# Patient Record
Sex: Female | Born: 1985 | Race: White | Hispanic: No | Smoking: Never smoker
Health system: Southern US, Community
[De-identification: ages and names within clinical notes are randomized; demographics above are authoritative.]

## PROBLEM LIST (undated history)

## (undated) DIAGNOSIS — F32A Depression, unspecified: Secondary | ICD-10-CM

## (undated) DIAGNOSIS — O99012 Anemia complicating pregnancy, second trimester: Secondary | ICD-10-CM

## (undated) DIAGNOSIS — F419 Anxiety disorder, unspecified: Secondary | ICD-10-CM

## (undated) DIAGNOSIS — F329 Major depressive disorder, single episode, unspecified: Secondary | ICD-10-CM

## (undated) HISTORY — DX: Anemia complicating pregnancy, second trimester: O99.012

## (undated) HISTORY — PX: KNEE SURGERY: SHX244

## (undated) HISTORY — PX: TONSILLECTOMY: SUR1361

## (undated) HISTORY — PX: LAPAROSCOPIC ABDOMINAL EXPLORATION: SHX6249

## (undated) HISTORY — PX: CHOLECYSTECTOMY: SHX55

---
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
## 2005-01-19 ENCOUNTER — Observation Stay: Payer: Self-pay | Admitting: Obstetrics & Gynecology

## 2005-04-02 ENCOUNTER — Observation Stay: Payer: Self-pay

## 2005-04-26 ENCOUNTER — Observation Stay: Payer: Self-pay

## 2005-05-12 ENCOUNTER — Inpatient Hospital Stay: Payer: Self-pay | Admitting: Obstetrics & Gynecology

## 2005-05-18 ENCOUNTER — Ambulatory Visit: Payer: Self-pay | Admitting: Pediatrics

## 2005-08-23 ENCOUNTER — Emergency Department: Payer: Self-pay | Admitting: General Practice

## 2005-11-19 ENCOUNTER — Emergency Department: Payer: Self-pay | Admitting: Internal Medicine

## 2005-11-20 ENCOUNTER — Ambulatory Visit: Payer: Self-pay | Admitting: Internal Medicine

## 2006-08-09 ENCOUNTER — Emergency Department: Payer: Self-pay | Admitting: Emergency Medicine

## 2006-08-13 ENCOUNTER — Emergency Department: Payer: Self-pay | Admitting: Emergency Medicine

## 2006-08-16 ENCOUNTER — Ambulatory Visit: Payer: Self-pay | Admitting: Emergency Medicine

## 2006-08-24 ENCOUNTER — Ambulatory Visit: Payer: Self-pay

## 2006-09-23 ENCOUNTER — Emergency Department: Payer: Self-pay | Admitting: Emergency Medicine

## 2006-11-30 ENCOUNTER — Ambulatory Visit: Payer: Self-pay

## 2007-01-29 ENCOUNTER — Observation Stay: Payer: Self-pay

## 2007-04-11 ENCOUNTER — Ambulatory Visit: Payer: Self-pay | Admitting: Obstetrics & Gynecology

## 2007-04-12 ENCOUNTER — Inpatient Hospital Stay: Payer: Self-pay | Admitting: Obstetrics & Gynecology

## 2008-04-17 ENCOUNTER — Emergency Department: Payer: Self-pay | Admitting: Emergency Medicine

## 2008-11-17 ENCOUNTER — Emergency Department: Payer: Self-pay | Admitting: Unknown Physician Specialty

## 2009-03-25 ENCOUNTER — Emergency Department: Payer: Self-pay | Admitting: Emergency Medicine

## 2009-04-30 ENCOUNTER — Observation Stay: Payer: Self-pay

## 2009-06-13 ENCOUNTER — Observation Stay: Payer: Self-pay

## 2009-07-17 ENCOUNTER — Ambulatory Visit: Payer: Self-pay | Admitting: Obstetrics & Gynecology

## 2009-07-18 ENCOUNTER — Inpatient Hospital Stay: Payer: Self-pay

## 2009-10-31 ENCOUNTER — Ambulatory Visit: Payer: Self-pay | Admitting: Internal Medicine

## 2010-09-05 ENCOUNTER — Ambulatory Visit: Payer: Self-pay

## 2010-09-26 ENCOUNTER — Ambulatory Visit: Payer: Self-pay | Admitting: Internal Medicine

## 2010-09-27 ENCOUNTER — Emergency Department: Payer: Self-pay | Admitting: Unknown Physician Specialty

## 2010-10-07 ENCOUNTER — Ambulatory Visit: Payer: Self-pay | Admitting: Anesthesiology

## 2010-10-09 ENCOUNTER — Ambulatory Visit: Payer: Self-pay | Admitting: Orthopedic Surgery

## 2011-02-22 ENCOUNTER — Emergency Department: Payer: Self-pay | Admitting: Emergency Medicine

## 2011-08-26 ENCOUNTER — Observation Stay: Payer: Self-pay

## 2011-08-26 LAB — URINALYSIS, COMPLETE
Bilirubin,UR: NEGATIVE
Blood: NEGATIVE
Glucose,UR: NEGATIVE mg/dL (ref 0–75)
Ph: 5 (ref 4.5–8.0)
Protein: 30
Specific Gravity: 1.026 (ref 1.003–1.030)
Squamous Epithelial: 7
WBC UR: 7 /HPF (ref 0–5)

## 2011-10-23 ENCOUNTER — Inpatient Hospital Stay: Payer: Self-pay

## 2011-10-23 LAB — CBC WITH DIFFERENTIAL/PLATELET
Basophil #: 0 10*3/uL (ref 0.0–0.1)
Basophil %: 0.2 %
Eosinophil #: 0 10*3/uL (ref 0.0–0.7)
Eosinophil %: 0.4 %
HCT: 25.9 % — ABNORMAL LOW (ref 35.0–47.0)
HGB: 8.3 g/dL — ABNORMAL LOW (ref 12.0–16.0)
Lymphocyte #: 1.2 10*3/uL (ref 1.0–3.6)
MCH: 25 pg — ABNORMAL LOW (ref 26.0–34.0)
MCHC: 31.9 g/dL — ABNORMAL LOW (ref 32.0–36.0)
MCV: 78 fL — ABNORMAL LOW (ref 80–100)
Monocyte #: 0.4 x10 3/mm (ref 0.2–0.9)
Monocyte %: 4.1 %
Neutrophil #: 8.3 10*3/uL — ABNORMAL HIGH (ref 1.4–6.5)
Neutrophil %: 82.9 %
Platelet: 227 10*3/uL (ref 150–440)
RBC: 3.31 10*6/uL — ABNORMAL LOW (ref 3.80–5.20)
RDW: 14.7 % — ABNORMAL HIGH (ref 11.5–14.5)

## 2011-10-25 LAB — BASIC METABOLIC PANEL
Anion Gap: 9 (ref 7–16)
Calcium, Total: 8.2 mg/dL — ABNORMAL LOW (ref 8.5–10.1)
Co2: 26 mmol/L (ref 21–32)
EGFR (African American): >60
EGFR (Non-African Amer.): >60
Glucose: 80 mg/dL (ref 65–99)
Sodium: 139 mmol/L (ref 136–145)

## 2011-10-25 LAB — CBC WITH DIFFERENTIAL/PLATELET
Basophil %: 0.1 %
HCT: 22 % — ABNORMAL LOW (ref 35.0–47.0)
HGB: 7.4 g/dL — ABNORMAL LOW (ref 12.0–16.0)
Lymphocyte #: 1.6 10*3/uL (ref 1.0–3.6)
Lymphocyte %: 17.1 %
MCH: 26.3 pg (ref 26.0–34.0)
MCHC: 33.3 g/dL (ref 32.0–36.0)
MCV: 79 fL — ABNORMAL LOW (ref 80–100)
Monocyte %: 5.8 %
Neutrophil #: 7.1 10*3/uL — ABNORMAL HIGH (ref 1.4–6.5)
Platelet: 218 10*3/uL (ref 150–440)
WBC: 9.4 10*3/uL (ref 3.6–11.0)

## 2012-03-23 ENCOUNTER — Ambulatory Visit: Payer: Self-pay | Admitting: Orthopedic Surgery

## 2012-05-11 ENCOUNTER — Ambulatory Visit: Payer: Self-pay

## 2012-06-06 ENCOUNTER — Ambulatory Visit: Payer: Self-pay

## 2012-09-28 ENCOUNTER — Emergency Department: Payer: Self-pay | Admitting: Emergency Medicine

## 2012-09-28 LAB — CBC
HCT: 31.3 % — ABNORMAL LOW (ref 35.0–47.0)
MCH: 26.3 pg (ref 26.0–34.0)
MCHC: 34.2 g/dL (ref 32.0–36.0)
MCV: 77 fL — ABNORMAL LOW (ref 80–100)
Platelet: 316 10*3/uL (ref 150–440)
RBC: 4.07 10*6/uL (ref 3.80–5.20)

## 2012-09-28 LAB — BASIC METABOLIC PANEL
Anion Gap: 2 — ABNORMAL LOW (ref 7–16)
Chloride: 103 mmol/L (ref 98–107)
Co2: 32 mmol/L (ref 21–32)
Creatinine: 0.86 mg/dL (ref 0.60–1.30)
EGFR (Non-African Amer.): >60
Glucose: 92 mg/dL (ref 65–99)
Osmolality: 273 (ref 275–301)
Sodium: 137 mmol/L (ref 136–145)

## 2012-09-28 LAB — URINALYSIS, COMPLETE
Ketone: NEGATIVE
Leukocyte Esterase: NEGATIVE
Nitrite: NEGATIVE
Protein: NEGATIVE
Squamous Epithelial: 4
WBC UR: 2 /HPF (ref 0–5)

## 2012-12-12 ENCOUNTER — Emergency Department: Payer: Self-pay | Admitting: Emergency Medicine

## 2012-12-31 ENCOUNTER — Emergency Department: Payer: Self-pay | Admitting: Emergency Medicine

## 2012-12-31 LAB — COMPREHENSIVE METABOLIC PANEL
Alkaline Phosphatase: 143 U/L — ABNORMAL HIGH (ref 50–136)
Chloride: 107 mmol/L (ref 98–107)
EGFR (African American): >60
EGFR (Non-African Amer.): >60
Glucose: 81 mg/dL (ref 65–99)
Osmolality: 272 (ref 275–301)
Potassium: 3.8 mmol/L (ref 3.5–5.1)
SGOT(AST): 43 U/L — ABNORMAL HIGH (ref 15–37)
Sodium: 138 mmol/L (ref 136–145)

## 2012-12-31 LAB — CBC
HGB: 10.6 g/dL — ABNORMAL LOW (ref 12.0–16.0)
MCH: 26.3 pg (ref 26.0–34.0)
MCHC: 33.4 g/dL (ref 32.0–36.0)
RDW: 13.8 % (ref 11.5–14.5)

## 2012-12-31 LAB — URINALYSIS, COMPLETE
Bacteria: NONE SEEN
Glucose,UR: NEGATIVE mg/dL (ref 0–75)
Leukocyte Esterase: NEGATIVE
Nitrite: NEGATIVE
Specific Gravity: 1.005 (ref 1.003–1.030)

## 2012-12-31 LAB — GC/CHLAMYDIA PROBE AMP

## 2013-09-10 ENCOUNTER — Emergency Department: Payer: Self-pay | Admitting: Emergency Medicine

## 2013-09-10 LAB — BASIC METABOLIC PANEL
Anion Gap: 4 — ABNORMAL LOW (ref 7–16)
BUN: 12 mg/dL (ref 7–18)
CHLORIDE: 105 mmol/L (ref 98–107)
Calcium, Total: 8.7 mg/dL (ref 8.5–10.1)
Co2: 29 mmol/L (ref 21–32)
Creatinine: 0.79 mg/dL (ref 0.60–1.30)
EGFR (African American): >60
Glucose: 79 mg/dL (ref 65–99)
Osmolality: 274 (ref 275–301)
Potassium: 4.3 mmol/L (ref 3.5–5.1)
Sodium: 138 mmol/L (ref 136–145)

## 2013-09-10 LAB — URINALYSIS, COMPLETE
BILIRUBIN, UR: NEGATIVE
Bacteria: NONE SEEN
GLUCOSE, UR: NEGATIVE mg/dL (ref 0–75)
KETONE: NEGATIVE
LEUKOCYTE ESTERASE: NEGATIVE
Nitrite: NEGATIVE
Ph: 6 (ref 4.5–8.0)
Protein: NEGATIVE
RBC,UR: 1 /HPF (ref 0–5)
SPECIFIC GRAVITY: 1.011 (ref 1.003–1.030)
Squamous Epithelial: 4

## 2013-09-10 LAB — CBC
HCT: 32.1 % — AB (ref 35.0–47.0)
HGB: 10.6 g/dL — ABNORMAL LOW (ref 12.0–16.0)
MCH: 27.2 pg (ref 26.0–34.0)
MCHC: 33.1 g/dL (ref 32.0–36.0)
MCV: 82 fL (ref 80–100)
Platelet: 292 10*3/uL (ref 150–440)
RBC: 3.9 10*6/uL (ref 3.80–5.20)
RDW: 13.7 % (ref 11.5–14.5)
WBC: 9.6 10*3/uL (ref 3.6–11.0)

## 2013-09-10 LAB — TSH: Thyroid Stimulating Horm: 2.27 u[IU]/mL

## 2013-11-24 ENCOUNTER — Encounter (HOSPITAL_COMMUNITY): Payer: Self-pay | Admitting: Emergency Medicine

## 2013-11-24 ENCOUNTER — Emergency Department (HOSPITAL_COMMUNITY)
Admission: EM | Admit: 2013-11-24 | Discharge: 2013-11-24 | Disposition: A | Discharge status: Home / Self Care | Payer: Self-pay | Attending: Emergency Medicine | Admitting: Emergency Medicine

## 2013-11-24 DIAGNOSIS — M542 Cervicalgia: Secondary | ICD-10-CM | POA: Insufficient documentation

## 2013-11-24 DIAGNOSIS — Z79899 Other long term (current) drug therapy: Secondary | ICD-10-CM | POA: Insufficient documentation

## 2013-11-24 DIAGNOSIS — Z880 Allergy status to penicillin: Secondary | ICD-10-CM | POA: Insufficient documentation

## 2013-11-24 DIAGNOSIS — M436 Torticollis: Secondary | ICD-10-CM | POA: Insufficient documentation

## 2013-11-24 MED ORDER — KETOROLAC TROMETHAMINE 60 MG/2ML IM SOLN
60.0000 mg | Freq: Once | INTRAMUSCULAR | Status: AC
  Administered 2013-11-24: 60 mg via INTRAMUSCULAR
  Filled 2013-11-24: qty 2

## 2013-11-24 MED ORDER — DIAZEPAM 5 MG PO TABS
5.0000 mg | ORAL_TABLET | Freq: Once | ORAL | Status: AC
  Administered 2013-11-24: 5 mg via ORAL
  Filled 2013-11-24: qty 1

## 2013-11-24 MED ORDER — HYDROCODONE-ACETAMINOPHEN 5-325 MG PO TABS: 2.0000 | ORAL_TABLET | ORAL | Status: DC | PRN

## 2013-11-24 MED ORDER — DIAZEPAM 5 MG PO TABS: 5.0000 mg | ORAL_TABLET | Freq: Four times a day (QID) | ORAL | Status: DC | PRN

## 2013-11-24 NOTE — ED Provider Notes (Signed)
CSN: [id number]     Arrival date & time 11/24/13  1040 History   First MD Initiated Contact with Patient 11/24/13 1100    This chart was scribed for Gilda Crease, * by Marica Otter, ED Scribe. This patient was seen in room APFT24/APFT24 and the patient's care was started at 11:01 AM.  Chief Complaint  Patient presents with  . Neck Pain   The history is provided by the patient. No language interpreter was used.   PCP: No primary provider on file. HPI Comments: Michele [name] is a 28 y.o. female who presents to the Emergency Department complaining of worsening neck pain onset this morning. Pt also complains of limited ROM of the neck. Pt notes that she nearly lost consciousness and began to feel very warm when she was at work this morning after rotating her neck.   History reviewed. No pertinent past medical history. Past Surgical History  Procedure Laterality Date  . Cesarean section    . Tonsillectomy     No family history on file. History  Substance Use Topics  . Smoking status: Never Smoker   . Smokeless tobacco: Not on file  . Alcohol Use: No   OB History   Grav Para Term Preterm Abortions TAB SAB Ect Mult Living                 Review of Systems  Musculoskeletal: Positive for neck pain.  Psychiatric/Behavioral: Negative for confusion.  All other systems reviewed and are negative.     Allergies  Penicillins and Sulfa antibiotics  Home Medications   Prior to Admission medications   Medication Sig Start Date End Date Taking? Authorizing Provider  diazepam (VALIUM) 5 MG tablet Take 1 tablet (5 mg total) by mouth every 6 (six) hours as needed for anxiety (spasms). 11/24/13   Gilda Crease, MD  HYDROcodone-acetaminophen (NORCO/VICODIN) 5-325 MG per tablet Take 2 tablets by mouth every 4 (four) hours as needed for moderate pain. 11/24/13   Gilda Crease, MD   Triage Vitals: BP 133/83  Pulse 93  Temp(Src) 98.3 F (36.8 C) (Oral)  Resp  16  SpO2 100% Physical Exam  Constitutional: She is oriented to person, place, and time. She appears well-developed and well-nourished.  HENT:  Head: Normocephalic and atraumatic.  Mouth/Throat: Uvula is midline, oropharynx is clear and moist and mucous membranes are normal. No oropharyngeal exudate, posterior oropharyngeal edema, posterior oropharyngeal erythema or tonsillar abscesses.  Neck: Muscular tenderness present. No spinous process tenderness present. Decreased range of motion (muscle spasm on left) present. No Brudzinski's sign and no Kernig's sign noted.    Musculoskeletal:       Thoracic back: Normal.       Lumbar back: Normal.   Normal strength upper extremities bilaterally  Neurological: She is alert and oriented to person, place, and time. She has normal strength. No cranial nerve deficit or sensory deficit. She exhibits normal muscle tone. GCS eye subscore is 4. GCS verbal subscore is 5. GCS motor subscore is 6.  Reflex Scores:      Tricep reflexes are 1+ on the right side and 1+ on the left side.      Bicep reflexes are 1+ on the right side and 1+ on the left side.   ED Course  Procedures (including critical care time) DIAGNOSTIC STUDIES: Oxygen Saturation is 100% on RA, nl by my interpretation.    COORDINATION OF CARE: 11:04 AM-Discussed treatment plan which includes meds with pt at  bedside and pt agreed to plan.   Labs Review Labs Reviewed - No data to display  Imaging Review No results found.   EKG Interpretation None      MDM   Final diagnoses:  Torticollis, acute   Presents to the ER for evaluation of neck pain. Patient has acute onset of spasm of the left side neck muscles consistent with acute torticollis. Posterior neck is unremarkable. No abnormality on oropharyngeal examination. Vital signs normal, no fever. Patient has normal strength and sensation, reflexes of upper extremities. No imaging necessary. Treat with analgesia, Valium for acute spasm  of muscles.  I personally performed the services described in this documentation, which was scribed in my presence. The recorded information has been reviewed and is accurate.      Gilda Crease, MD 11/24/13 1110

## 2013-11-24 NOTE — ED Notes (Signed)
Pt reports waking up this am with left sided neck pain. Denies sore throat. Denies toothache. Pt worse with movement per pt.

## 2013-11-24 NOTE — Discharge Instructions (Signed)

## 2014-03-08 IMAGING — CT CT HEAD WITHOUT CONTRAST
1 series · 16 of 30 positions shown, 20 images · non-contrast
Comparison: none

REASON FOR EXAM: Headache (left sided), syncope
COMMENTS:

PROCEDURE:     CT  - CT HEAD WITHOUT CONTRAST  - September 28, 2012  [DATE]
RESULT:     Technique: Helical 5mm sections were obtained from the skull
base to the vertex without administration of intravenous contrast.

[Series 2: soft tissue · axial · 0.40mm/px · z∈[-144,+6]mm · 16 of 34 slices shown, 20 images]
[im 2/34  brain]
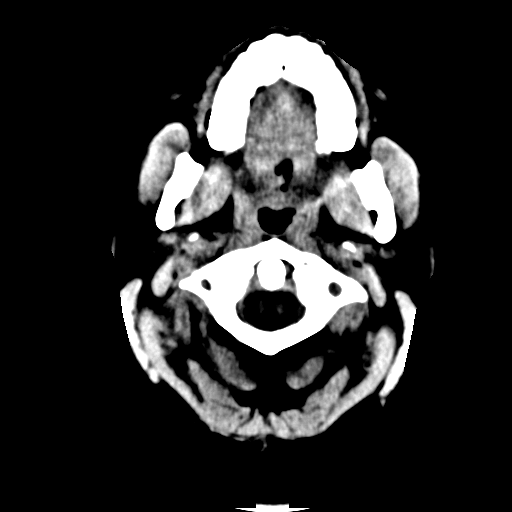
[im 2/34  bone]
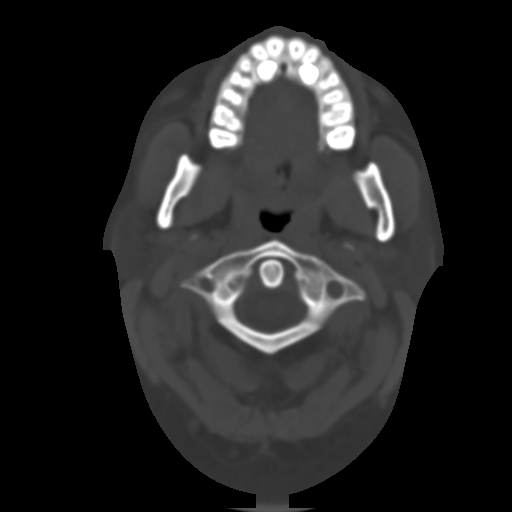
[im 4/34  brain]
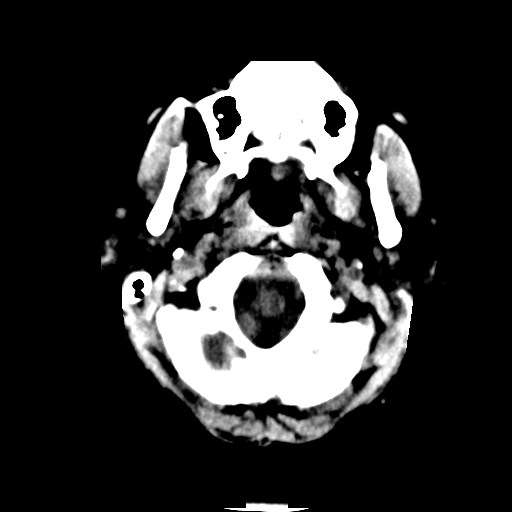
[im 6/34  brain]
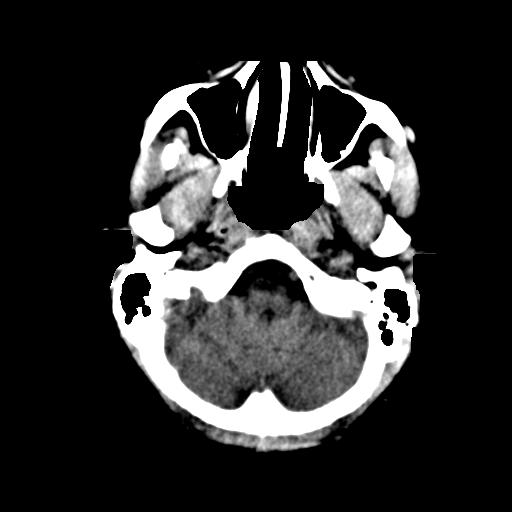
[im 8/34  brain]
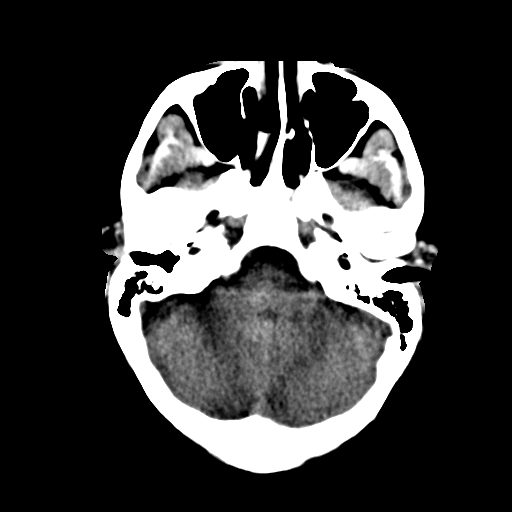
[im 10/34  brain]
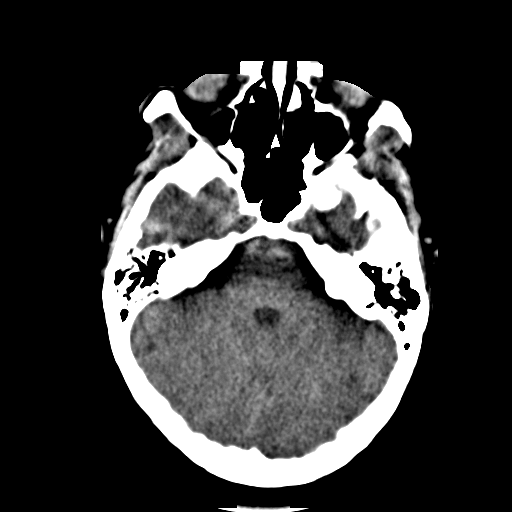
[im 10/34  bone]
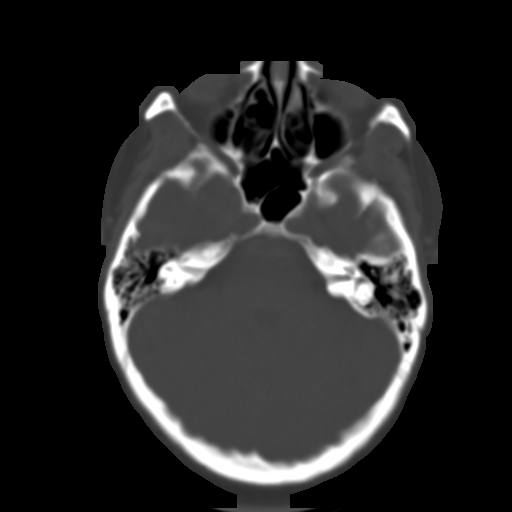
[im 12/34  brain]
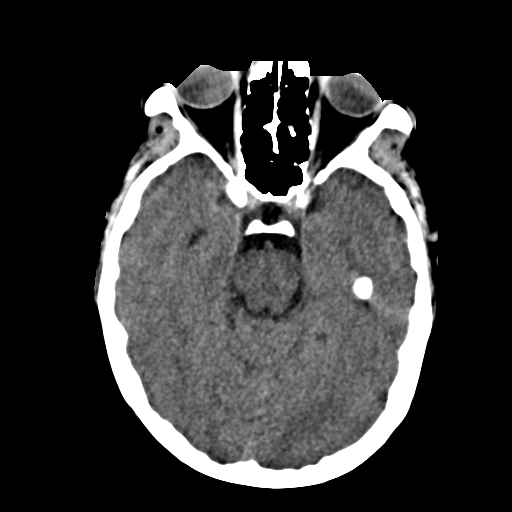
[im 14/34  brain]
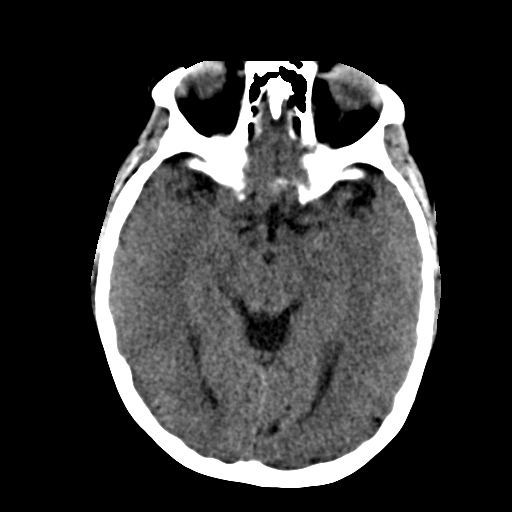
[im 16/34  brain]
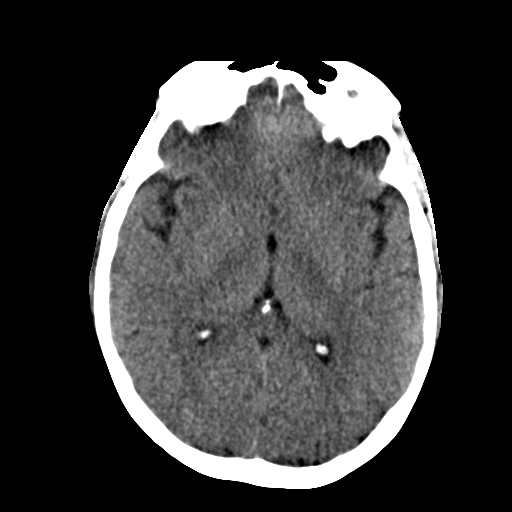
[im 18/34  brain]
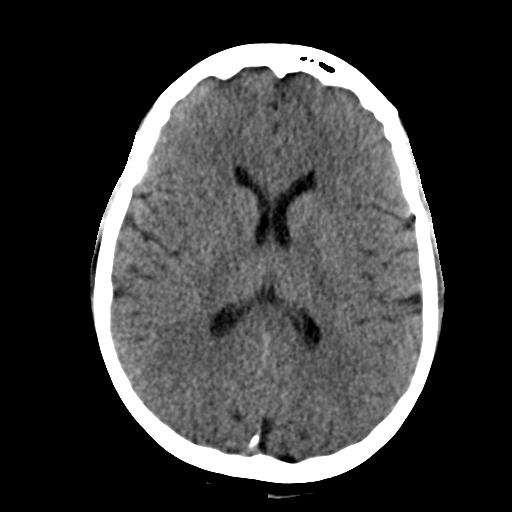
[im 18/34  bone]
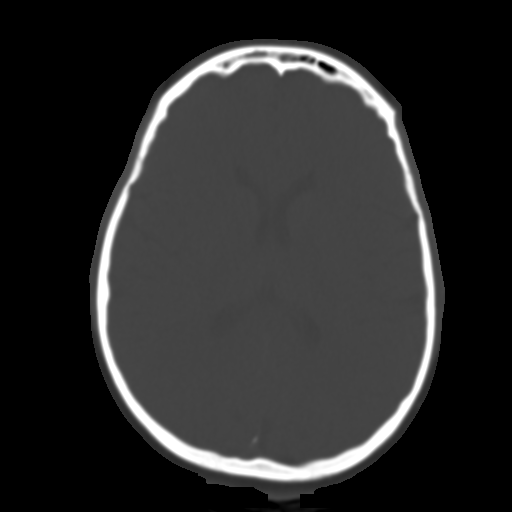
[im 20/34  brain]
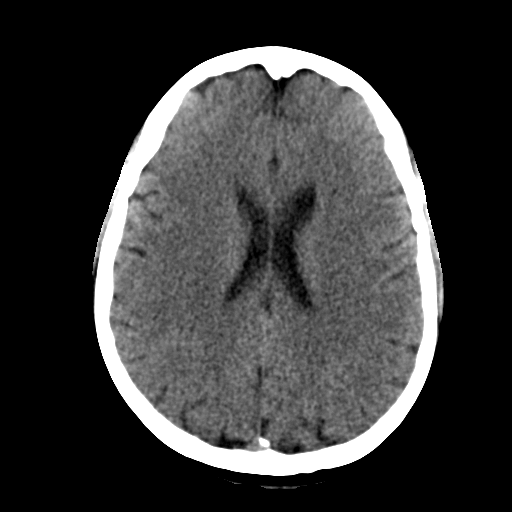
[im 22/34  brain]
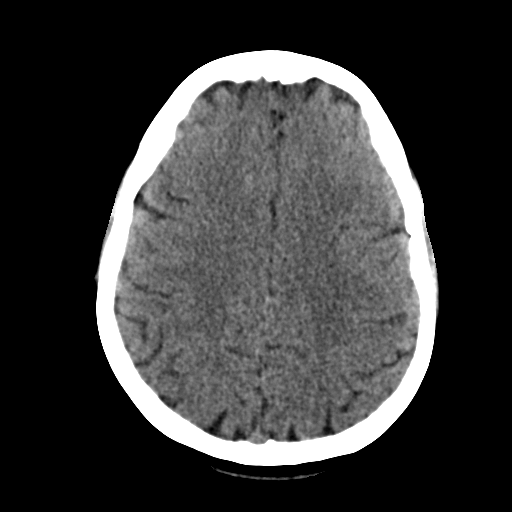
[im 24/34  brain]
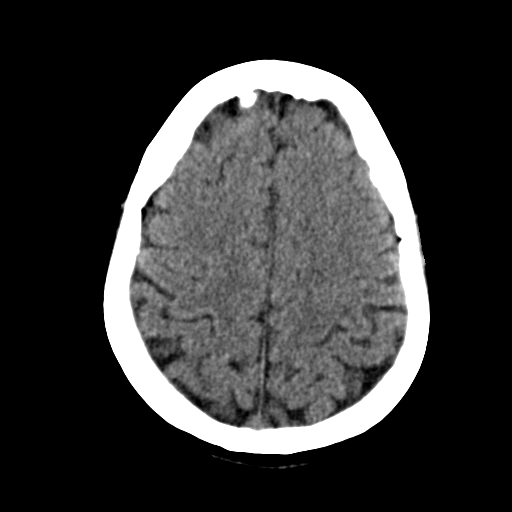
[im 26/34  brain]
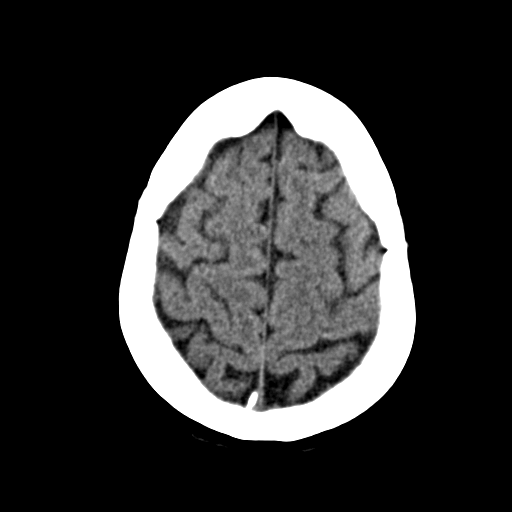
[im 26/34  bone]
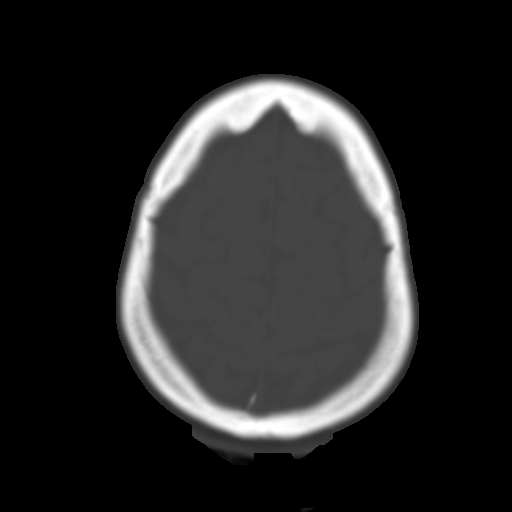
[im 28/34  brain]
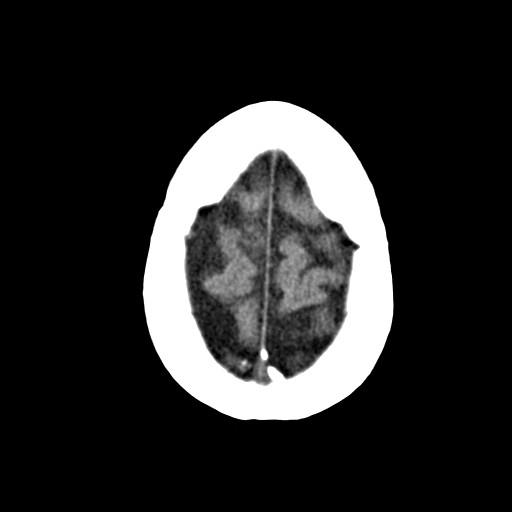
[im 30/34  brain]
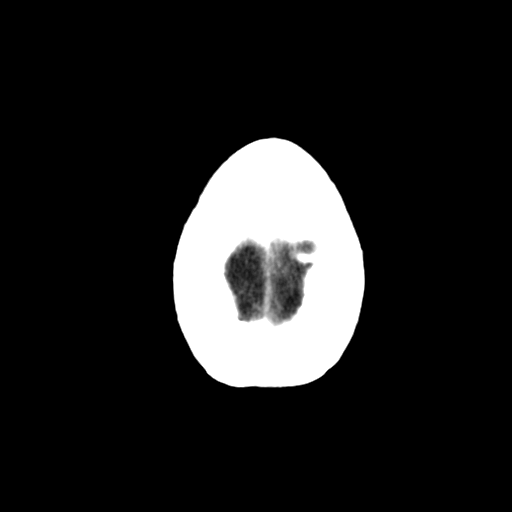
[im 32/34  brain]
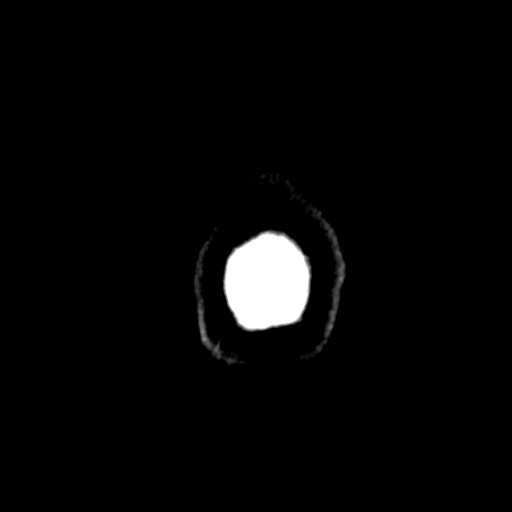

[16 of 30 positions shown; findings below may reference images not displayed]

FINDINGS: There is not evidence of intra-axial fluid collections. There is
no evidence of acute hemorrhage or secondary signs reflecting mass effect or
subacute or chronic focal territorial infarction. The osseous structures
demonstrate no evidence of a depressed skull fracture. If there is
persistent concern clinical follow-up with MRI is recommended.

Visualized paranasal sinuses and mastoid air cells are patent.
IMPRESSION: 1. No evidence of acute intracranial abnormalities.
2. Comparison made to prior study dated 09/27/2010.

## 2014-06-10 IMAGING — US US PELV - US TRANSVAGINAL
1 series · 14 of 25 positions shown · non-contrast
Comparison: none

REASON FOR EXAM: RLQ abd pain
COMMENTS:

[Series 1: us pelv - us transvaginal · 0.28mm/px · 14 of 90 slices shown]
[im 1/90]
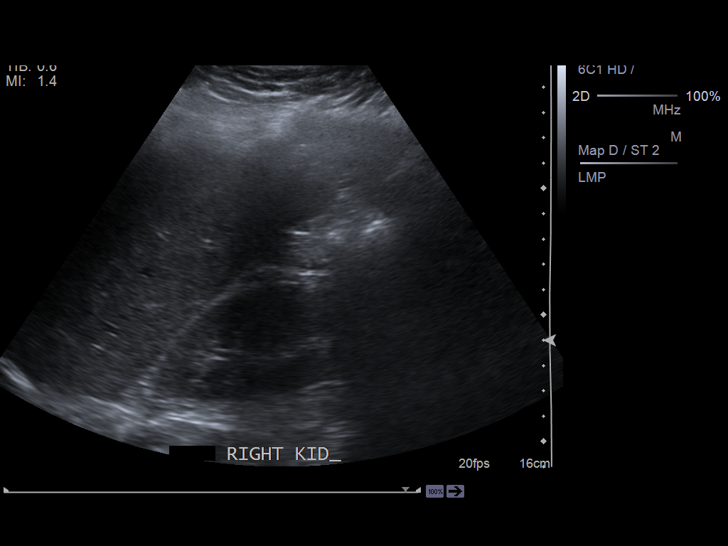
[im 8/90]
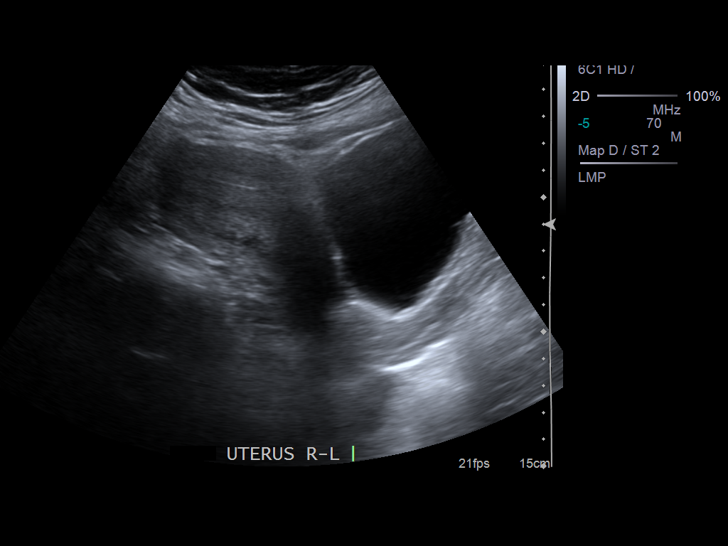
[im 15/90]
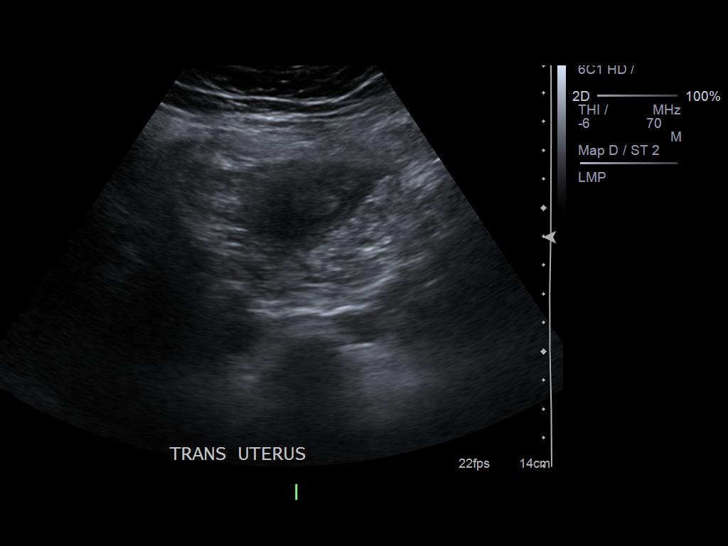
[im 23/90]
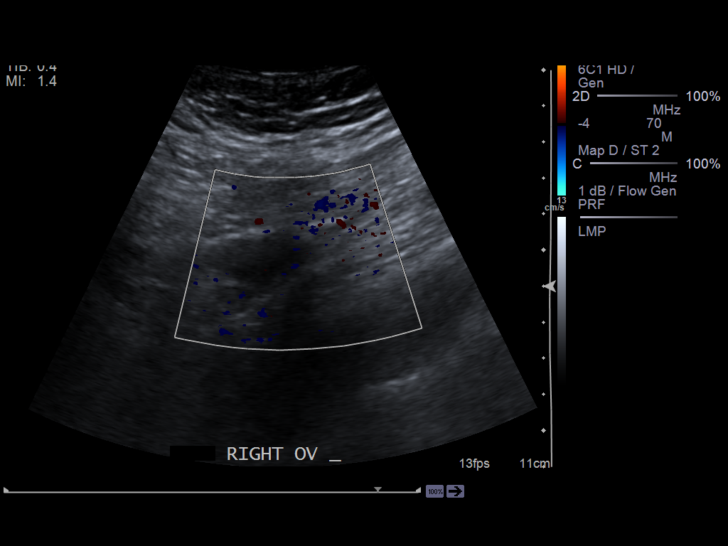
[im 30/90]
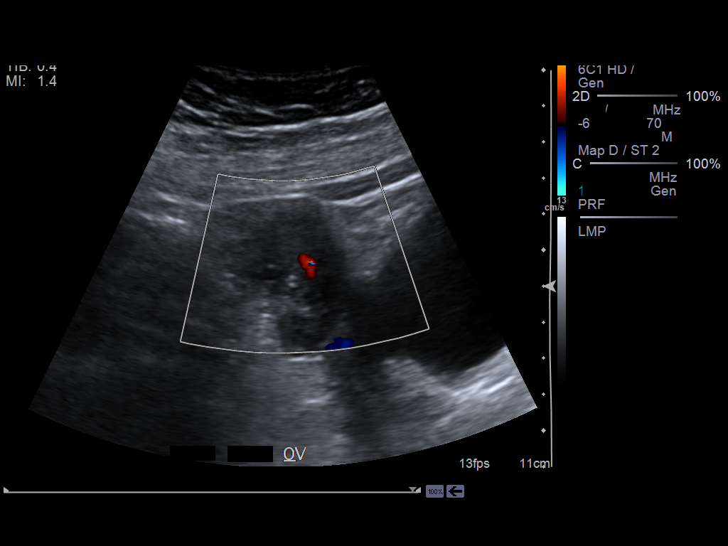
[im 34/90]
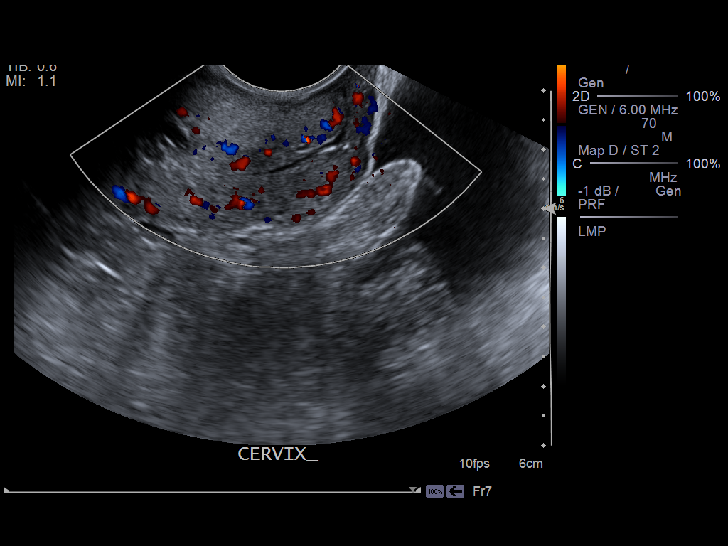
[im 41/90]
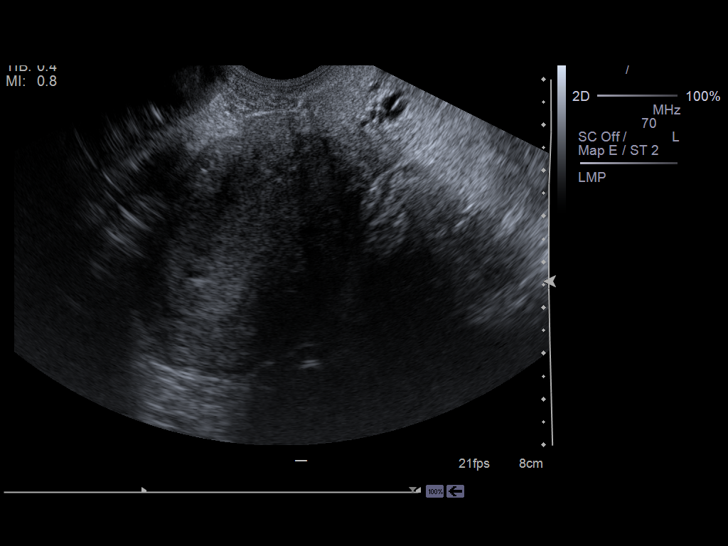
[im 49/90]
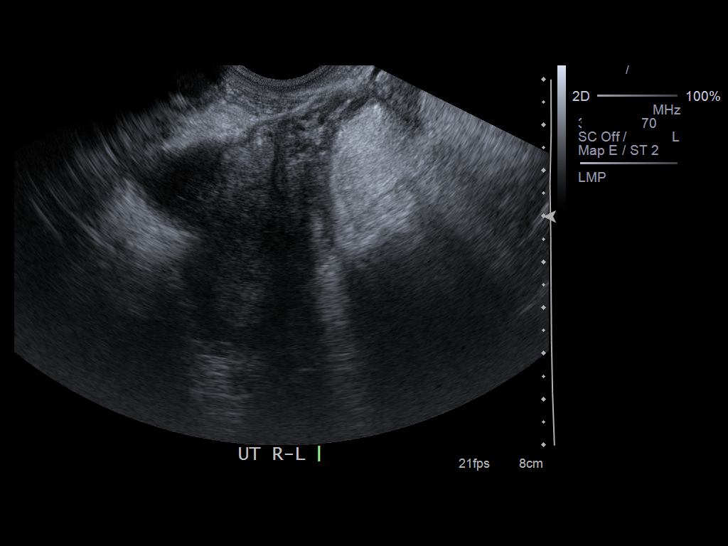
[im 56/90]
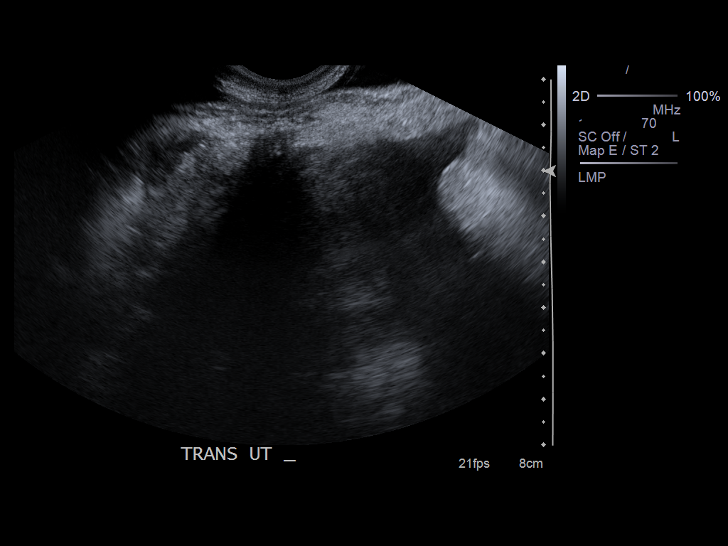
[im 60/90]
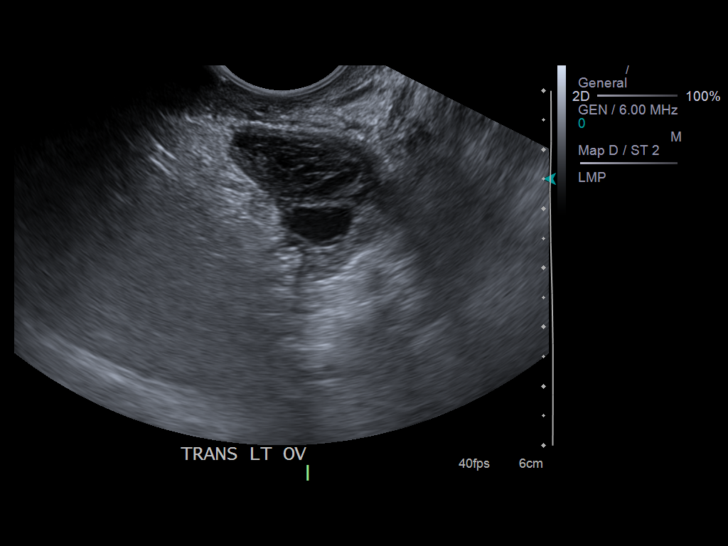
[im 67/90]
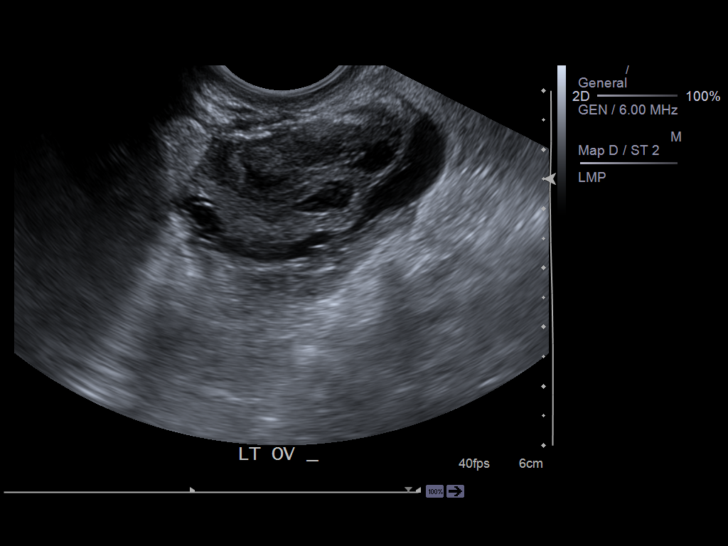
[im 75/90]
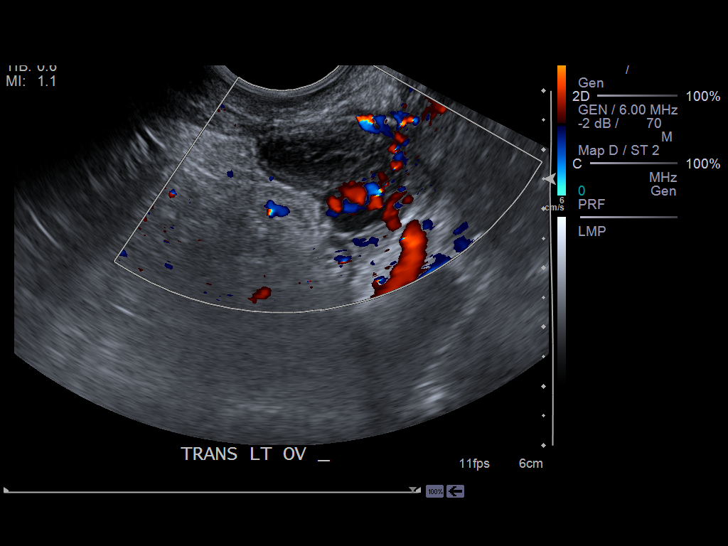
[im 82/90]
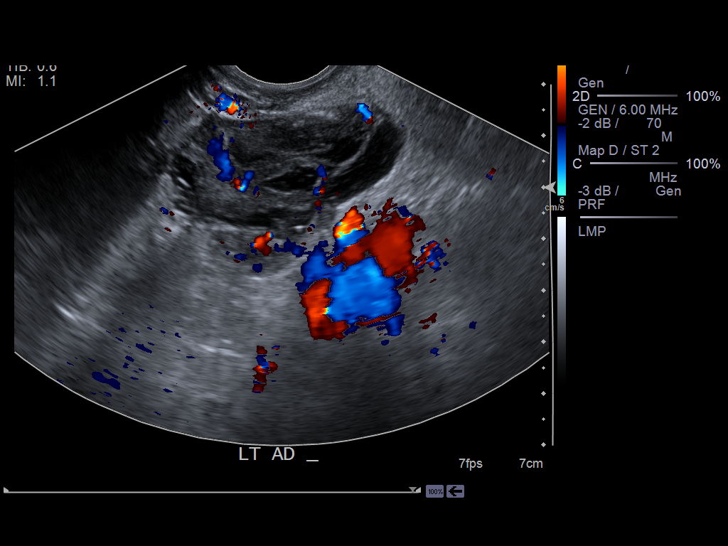
[im 90/90]
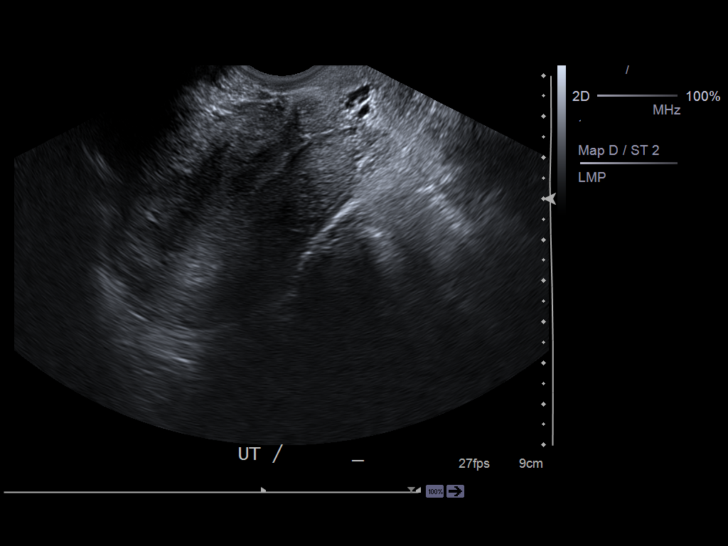

[14 of 25 positions shown; findings below may reference images not displayed]

PROCEDURE:     US  - US PELVIS EXAM W/TRANSVAGINAL  - December 31, 2012  [DATE]

RESULT:     Transabdominal and endovaginal pelvic sonogram is performed. The
uterus measures 9.39 x 5.48 x 5.49 cm with an endometrial thickness of
cm. No myometrial mass is present. The left ovary measures 3.29 x 2.13 x
4.50 cm. The right ovary is only seen on transabdominal imaging and measures
2.01 x 1.29 x 2.44 cm. The kidneys appear grossly normal on transabdominal
images. There is a moderate amount of free fluid in the cul-de-sac. The
uterus is retroverted. There is a trace of fluid in the cervix. The left
ovary shows a complex lesion with peripheral flow. This measures 3.01 x
x 2.10 cm. The patient has a history of endometriosis. The possibility of
endometriomas not excluded. Blood flow is seen in both ovaries.
IMPRESSION: 1. Moderate amount of free fluid. Complex appearing area along the periphery
of the left ovary. Correlate for endometriosis.

[REDACTED]

## 2014-06-19 NOTE — Op Note (Signed)
PATIENT NAME:  Michele [name], [name] MR#:  [id number] DATE OF BIRTH:  25-Jul-1985  DATE OF PROCEDURE:  10/23/2011  PREOPERATIVE DIAGNOSIS:  1. Intrauterine pregnancy at 38 weeks, 0 days by last menstrual period consistent with 10-week ultrasound.  2. History of previous cesarean section x3 with onset of labor this morning.  POSTOPERATIVE DIAGNOSIS:  1. Intrauterine pregnancy at 38 weeks, 0 days by last menstrual period consistent with 10-week ultrasound.  2. History of previous cesarean section x3 with onset of labor this morning.  INDICATIONS: A 29 year old gravida 4, para 3-0-0-3 with history of three prior cesarean sections, in term labor.   PROCEDURE: Repeat low transverse cesarean section.   SURGEON: Ali LoweEryn K. Garnette GunnerStansbury Clipp, MD    ASSISTANT: Conard NovakStephen D. Jackson, MD   ANESTHESIA: Spinal.   COMPLICATIONS: None.   ESTIMATED BLOOD LOSS: 500 mL.   FINDINGS: Normal uterus, bilateral tubes and ovaries. Moderate amount of adhesive disease of myofascial junction and vesicouterine junction.  Liveborn female infant in vertex presentation. No meconium, no nuchal cord. Apgars 8 and 9. Weight 3010 grams. Pediatrics was present for delivery.   MEDICATIONS: 2 grams Ancef preoperatively and 20 units dilute Pitocin after placental delivery.  DISPOSITION: Mother to recovery room, infant to newborn nursery, both in stable condition.   DESCRIPTION OF PROCEDURE: The patient was taken to the operating room where she was placed on the operating room table.  She was given anesthesia via spinal. Fetal heart tones were confirmed in the 140s prior to delivery.  She was prepped and draped in standard fashion. A Pfannenstiel incision was made in the skin 2 cm above the pubic symphysis overlying previous scars.  This was done using the knife, and incision was carried down sharply to the level of the rectus fascia.  The fascial incision was extended bilaterally with Mayo scissors.  The superior border of the rectus  fascia was then grasped with two Kocher clamps, elevated and bluntly dissected off the underlying rectus muscles, followed by incision of the median raphe using the knife.  The rectus muscles were separated in the midline. The peritoneum had been entered with takedown of the median raphe. All underlying tissue was inspected and found to be out of harm's way.  The peritoneal incision was extended bilaterally with Metzenbaum scissors. The peritoneal opening was extended manually by pulling.  The operator's hand was then inserted into the abdomen.  The uterus was found to be in a nonrotated position.  The bladder blade was inserted.  A mild amount of adhesive disease was found between the lower uterine segment and bladder.  The bladder flap was developed using Metzenbaum scissors.  Next, a low transverse incision was made in the uterus using the knife and dissected bluntly using the operator's finger. The incision was extended laterally and superiorly bilaterally bluntly.  The operator's hand was then inserted into the uterus to find an infant in vertex presentation.  The infant's vertex was grasped, flexed and elevated with additional fundal pressure.  There was some difficulty in delivery of the infant's vertex.  Decision was made to use a soft cup vacuum applied to the infant's vertex, which allowed the infant to be easily delivered through the low transverse incision. The infant was bulb suctioned.  The cord was doubly clamped and cut, and the infant was passed to the awaiting pediatricians.  The placenta was then delivered using fundal pressure.  The uterus was exteriorized, and the uterine cavity was wiped free of all clots and debris. The  bladder blade was reinserted, and the hysterotomy incision was repaired with 0 Vicryl suture in a running locked fashion, followed by a second #1 Vicryl suture in a horizontal imbricating fashion.  Tubes, ovaries and adnexa were visualized and normal.  The uterus was replaced  into the abdomen without difficulty.  The hysterotomy was again inspected.  One small area of persistent oozing was noted at the right lateral aspect of the hysterotomy. This was controlled with a single figure-of-eight stitch. The rectus fascia was then closed using two 0 Vicryl sutures in a running fashion.  The incision was copiously irrigated. Hemostasis was noted to be excellent.  Subcutaneous tissue was closed using a 2-0 plain gut in a running fashion.  The skin was closed with 4-0 Vicryl in a subcuticular fashion followed by application of Dermabond.  All counts were correct x2. The patient tolerated the procedure well and was taken to the recovery room  in stable condition.  ____________________________ Ali Lowe Garnette Gunner, MD eks:cbb D: 10/23/2011 18:22:57 ET T: 10/23/2011 18:36:12 ET JOB#: [id number] Marlaine Hind CLIPP MD ELECTRONICALLY SIGNED 10/25/2011 16:10

## 2014-07-10 NOTE — H&P (Signed)
L&D Evaluation:  History:   HPI 29 yo [id number] at 3375w0d gestational age by LMP consistent with 10 week ultrasound presents with regular uterine contractions since 4am this morning.  She notes positive fetal movement, denies leakage of fluid and vaginal bleeding.  Pregnancy complicated by history of 3 prior cesarean sections and Obesity.  Blood type is O+, VZI, HBsAg neg, RI, RPR NR, GBS neg.    Patient's Medical History Obesity, history of depression    Patient's Surgical History Previous C-Section  Knee surgery    Medications Pre Natal Vitamins  Iron    Allergies multiple, see chart    Social History none    Family History Non-Contributory   ROS:   ROS All systems were reviewed.  HEENT, CNS, GI, GU, Respiratory, CV, Renal and Musculoskeletal systems were found to be normal., unless noted in HPI   Exam:   Vital Signs stable    Urine Protein not completed    General mild distress with contractions    Mental Status clear    Chest clear    Heart normal sinus rhythm    Abdomen gravid, tender with contractions    Estimated Fetal Weight Average for gestational age    Back no CVAT    Pelvic no external lesions, cervical change from closed to 1-2cm over 2 hours    Mebranes Intact    FHT normal rate with no decels    FHT Description 130/mod var/+accels/no decels    Ucx regular    Skin no lesions   Impression:   Impression active labor, reactive NST, history of previous cesarean sections x 3   Plan:   Comments - to OR for repeat cesarean section - Will give either Ancef of Vancomycin for abx prophylaxis (will review previous charts for what we've given) - discussed risks/benefits of proceeding with cesarean.   - She desires permanent sterilization. - CBC   Electronic Signatures: Conard NovakJackson, Nakaiya Beddow D (MD)  (Signed 23-Aug-13 16:16)  Authored: L&D Evaluation   Last Updated: 23-Aug-13 16:16 by Conard NovakJackson, Chanee Henrickson D (MD)

## 2014-08-01 ENCOUNTER — Emergency Department (HOSPITAL_COMMUNITY)
Admission: EM | Admit: 2014-08-01 | Discharge: 2014-08-01 | Disposition: A | Discharge status: Home / Self Care | Payer: Self-pay | Attending: Emergency Medicine | Admitting: Emergency Medicine

## 2014-08-01 ENCOUNTER — Encounter (HOSPITAL_COMMUNITY): Payer: Self-pay | Admitting: *Deleted

## 2014-08-01 DIAGNOSIS — Z880 Allergy status to penicillin: Secondary | ICD-10-CM | POA: Insufficient documentation

## 2014-08-01 DIAGNOSIS — L309 Dermatitis, unspecified: Secondary | ICD-10-CM | POA: Insufficient documentation

## 2014-08-01 MED ORDER — TRIAMCINOLONE ACETONIDE 0.1 % EX CREA: 1.0000 "application " | TOPICAL_CREAM | Freq: Two times a day (BID) | CUTANEOUS | Status: DC

## 2014-08-01 MED ORDER — HYDROXYZINE HCL 25 MG PO TABS: 25.0000 mg | ORAL_TABLET | Freq: Four times a day (QID) | ORAL | Status: DC

## 2014-08-01 MED ORDER — PREDNISONE 10 MG PO TABS: ORAL_TABLET | ORAL | Status: DC

## 2014-08-01 MED ORDER — PREDNISONE 50 MG PO TABS
60.0000 mg | ORAL_TABLET | Freq: Once | ORAL | Status: AC
  Administered 2014-08-01: 60 mg via ORAL
  Filled 2014-08-01 (×2): qty 1

## 2014-08-01 NOTE — ED Notes (Signed)
Purpose of I and O cath explained to pt due to pt starting her menstrual  cycle today, pt states that she will follow up at [facility] to have her urine checked,

## 2014-08-01 NOTE — ED Provider Notes (Signed)
CSN: [id number]     Arrival date & time 08/01/14  1942 History   First MD Initiated Contact with Patient 08/01/14 2118     Chief Complaint  Patient presents with  . Rash     (Consider location/radiation/quality/duration/timing/severity/associated sxs/prior Treatment) Patient is a 29 y.o. female presenting with rash. The history is provided by the patient.  Rash Location:  Shoulder/arm and torso Shoulder/arm rash location: Right and left the end of the elbow area. Torso rash location:  Abd LUQ and abd LLQ Quality: dryness, itchiness and redness   Severity:  Severe Onset quality:  Gradual Duration:  2 weeks Timing:  Intermittent Progression:  Worsening Chronicity:  New Context comment:  Unknown Relieved by:  Nothing Worsened by:  Nothing tried Ineffective treatments: Over-the-counter anti-itch creams. Associated symptoms: no fever, no nausea, no throat swelling, no tongue swelling and not vomiting     History reviewed. No pertinent past medical history. Past Surgical History  Procedure Laterality Date  . Cesarean section    . Tonsillectomy     History reviewed. No pertinent family history. History  Substance Use Topics  . Smoking status: Never Smoker   . Smokeless tobacco: Not on file  . Alcohol Use: No   OB History    No data available     Review of Systems  Constitutional: Negative for fever.  Gastrointestinal: Negative for nausea and vomiting.  Skin: Positive for rash.  All other systems reviewed and are negative.     Allergies  Penicillins and Sulfa antibiotics  Home Medications   Prior to Admission medications   Medication Sig Start Date End Date Taking? Authorizing Provider  diphenhydrAMINE (BENADRYL) 25 mg capsule Take 50 mg by mouth every 6 (six) hours as needed for itching.   Yes Historical Provider, MD  diazepam (VALIUM) 5 MG tablet Take 1 tablet (5 mg total) by mouth every 6 (six) hours as needed for anxiety (spasms). Patient not taking:  Reported on 08/01/2014 11/24/13   Gilda Creasehristopher J Pollina, MD  HYDROcodone-acetaminophen (NORCO/VICODIN) 5-325 MG per tablet Take 2 tablets by mouth every 4 (four) hours as needed for moderate pain. Patient not taking: Reported on 08/01/2014 11/24/13   Gilda Creasehristopher J Pollina, MD   BP 134/82 mmHg  Pulse 99  Temp(Src) 99.1 F (37.3 C) (Oral)  Resp 18  Ht 5\' 4"  (1.626 m)  Wt 250 lb (113.399 kg)  BMI 42.89 kg/m2  SpO2 100%  LMP 08/01/2014 Physical Exam  Constitutional: She is oriented to person, place, and time. She appears well-developed and well-nourished.  Non-toxic appearance.  HENT:  Head: Normocephalic.  Right Ear: Tympanic membrane and external ear normal.  Left Ear: Tympanic membrane and external ear normal.  Eyes: EOM and lids are normal. Pupils are equal, round, and reactive to light.  Neck: Normal range of motion. Neck supple. Carotid bruit is not present.  Cardiovascular: Normal rate, regular rhythm, normal heart sounds, intact distal pulses and normal pulses.   Pulmonary/Chest: Breath sounds normal. No respiratory distress.  Abdominal: Soft. Bowel sounds are normal. There is no tenderness. There is no guarding.  Musculoskeletal: Normal range of motion.  Lymphadenopathy:       Head (right side): No submandibular adenopathy present.       Head (left side): No submandibular adenopathy present.    She has no cervical adenopathy.  Neurological: She is alert and oriented to person, place, and time. She has normal strength. No cranial nerve deficit or sensory deficit.  Skin: Skin is warm and dry.  Patient has a red, rough, scaling rash involving the right and left antecubital area. The area behind the right and left knee. On the abdomen and lower back.  Psychiatric: She has a normal mood and affect. Her speech is normal.  Nursing note and vitals reviewed.   ED Course  Procedures (including critical care time) Labs Review Labs Reviewed - No data to display  Imaging Review No  results found.   EKG Interpretation None      MDM  The examination suggest the rash is probably related to eczema. The patient will be treated with prednisone, triamcinolone, and Vistaril. Patient is to follow with her primary physician for additional evaluation and management if not improving.    Final diagnoses:  None    *I have reviewed nursing notes, vital signs, and all appropriate lab and imaging results for this patient.[address] PA-C 08/01/14 2310  Bethann Berkshire, MD 08/02/14 [phone]

## 2014-08-01 NOTE — Discharge Instructions (Signed)
Please see Dr. Margo Aye, or the dermatologist of your choice if not improving. Vistaril may cause drowsiness, please use Claritin, or Allegra during the day, and use Vistaril in the evening and at bedtime. Eczema Eczema, also called atopic dermatitis, is a skin disorder that causes inflammation of the skin. It causes a red rash and dry, scaly skin. The skin becomes very itchy. Eczema is generally worse during the cooler winter months and often improves with the warmth of summer. Eczema usually starts showing signs in infancy. Some children outgrow eczema, but it may last through adulthood.  CAUSES  The exact cause of eczema is not known, but it appears to run in families. People with eczema often have a family history of eczema, allergies, asthma, or hay fever. Eczema is not contagious. Flare-ups of the condition may be caused by:   Contact with something you are sensitive or allergic to.   Stress. SIGNS AND SYMPTOMS  Dry, scaly skin.   Red, itchy rash.   Itchiness. This may occur before the skin rash and may be very intense.  DIAGNOSIS  The diagnosis of eczema is usually made based on symptoms and medical history. TREATMENT  Eczema cannot be cured, but symptoms usually can be controlled with treatment and other strategies. A treatment plan might include:  Controlling the itching and scratching.   Use over-the-counter antihistamines as directed for itching. This is especially useful at night when the itching tends to be worse.   Use over-the-counter steroid creams as directed for itching.   Avoid scratching. Scratching makes the rash and itching worse. It may also result in a skin infection (impetigo) due to a break in the skin caused by scratching.   Keeping the skin well moisturized with creams every day. This will seal in moisture and help prevent dryness. Lotions that contain alcohol and water should be avoided because they can dry the skin.   Limiting exposure to things that  you are sensitive or allergic to (allergens).   Recognizing situations that cause stress.   Developing a plan to manage stress.  HOME CARE INSTRUCTIONS   Only take over-the-counter or prescription medicines as directed by your health care provider.   Do not use anything on the skin without checking with your health care provider.   Keep baths or showers short (5 minutes) in warm (not hot) water. Use mild cleansers for bathing. These should be unscented. You may add nonperfumed bath oil to the bath water. It is best to avoid soap and bubble bath.   Immediately after a bath or shower, when the skin is still damp, apply a moisturizing ointment to the entire body. This ointment should be a petroleum ointment. This will seal in moisture and help prevent dryness. The thicker the ointment, the better. These should be unscented.   Keep fingernails cut short. Children with eczema may need to wear soft gloves or mittens at night after applying an ointment.   Dress in clothes made of cotton or cotton blends. Dress lightly, because heat increases itching.   A child with eczema should stay away from anyone with fever blisters or cold sores. The virus that causes fever blisters (herpes simplex) can cause a serious skin infection in children with eczema. SEEK MEDICAL CARE IF:   Your itching interferes with sleep.   Your rash gets worse or is not better within 1 week after starting treatment.   You see pus or soft yellow scabs in the rash area.   You have a  fever.   You have a rash flare-up after contact with someone who has fever blisters.  Document Released: 02/14/2000 Document Revised: 12/07/2012 Document Reviewed: 09/19/2012 [facility] Patient Information 2015 [employer] [place]. This information is not intended to replace advice given to you by your health care provider. Make sure you discuss any questions you have with your health care provider.

## 2014-08-01 NOTE — ED Notes (Signed)
Pt c/o rash for the past two weeks, odd odor to urine, pt states that it smells like a "perm", denies any increase in frequency, denies any pain, denies any burning with urination,

## 2014-08-01 NOTE — ED Notes (Signed)
Pt co rash to body x 2 weeks.

## 2014-09-14 ENCOUNTER — Encounter: Payer: Self-pay | Admitting: Emergency Medicine

## 2014-09-14 ENCOUNTER — Inpatient Hospital Stay
Admission: EM | Admit: 2014-09-14 | Discharge: 2014-09-17 | DRG: 881 | Disposition: A | Discharge status: Home / Self Care | Payer: No Typology Code available for payment source | Attending: Psychiatry | Admitting: Psychiatry

## 2014-09-14 ENCOUNTER — Other Ambulatory Visit: Payer: Self-pay

## 2014-09-14 ENCOUNTER — Emergency Department: Payer: Self-pay

## 2014-09-14 ENCOUNTER — Emergency Department
Admission: EM | Admit: 2014-09-14 | Discharge: 2014-09-14 | Disposition: A | Discharge status: Other Acute Inpatient Hospital | Payer: Self-pay | Attending: Emergency Medicine | Admitting: Emergency Medicine

## 2014-09-14 DIAGNOSIS — T1491 Suicide attempt: Secondary | ICD-10-CM | POA: Diagnosis present

## 2014-09-14 DIAGNOSIS — Y9289 Other specified places as the place of occurrence of the external cause: Secondary | ICD-10-CM | POA: Insufficient documentation

## 2014-09-14 DIAGNOSIS — T391X2A Poisoning by 4-Aminophenol derivatives, intentional self-harm, initial encounter: Secondary | ICD-10-CM | POA: Insufficient documentation

## 2014-09-14 DIAGNOSIS — Z9889 Other specified postprocedural states: Secondary | ICD-10-CM | POA: Diagnosis not present

## 2014-09-14 DIAGNOSIS — G8929 Other chronic pain: Secondary | ICD-10-CM | POA: Diagnosis present

## 2014-09-14 DIAGNOSIS — F329 Major depressive disorder, single episode, unspecified: Principal | ICD-10-CM | POA: Diagnosis present

## 2014-09-14 DIAGNOSIS — F32A Depression, unspecified: Secondary | ICD-10-CM

## 2014-09-14 DIAGNOSIS — F1310 Sedative, hypnotic or anxiolytic abuse, uncomplicated: Secondary | ICD-10-CM | POA: Insufficient documentation

## 2014-09-14 DIAGNOSIS — M2550 Pain in unspecified joint: Secondary | ICD-10-CM | POA: Diagnosis present

## 2014-09-14 DIAGNOSIS — Z6839 Body mass index (BMI) 39.0-39.9, adult: Secondary | ICD-10-CM

## 2014-09-14 DIAGNOSIS — Z880 Allergy status to penicillin: Secondary | ICD-10-CM | POA: Insufficient documentation

## 2014-09-14 DIAGNOSIS — Z818 Family history of other mental and behavioral disorders: Secondary | ICD-10-CM | POA: Diagnosis not present

## 2014-09-14 DIAGNOSIS — F332 Major depressive disorder, recurrent severe without psychotic features: Secondary | ICD-10-CM | POA: Diagnosis present

## 2014-09-14 DIAGNOSIS — E663 Overweight: Secondary | ICD-10-CM | POA: Diagnosis present

## 2014-09-14 DIAGNOSIS — Z79899 Other long term (current) drug therapy: Secondary | ICD-10-CM | POA: Insufficient documentation

## 2014-09-14 DIAGNOSIS — R45851 Suicidal ideations: Secondary | ICD-10-CM

## 2014-09-14 DIAGNOSIS — T40602A Poisoning by unspecified narcotics, intentional self-harm, initial encounter: Secondary | ICD-10-CM

## 2014-09-14 DIAGNOSIS — Z882 Allergy status to sulfonamides status: Secondary | ICD-10-CM | POA: Diagnosis not present

## 2014-09-14 DIAGNOSIS — Y998 Other external cause status: Secondary | ICD-10-CM | POA: Insufficient documentation

## 2014-09-14 DIAGNOSIS — T43212A Poisoning by selective serotonin and norepinephrine reuptake inhibitors, intentional self-harm, initial encounter: Secondary | ICD-10-CM | POA: Insufficient documentation

## 2014-09-14 DIAGNOSIS — T1491XA Suicide attempt, initial encounter: Secondary | ICD-10-CM

## 2014-09-14 DIAGNOSIS — T447X2A Poisoning by beta-adrenoreceptor antagonists, intentional self-harm, initial encounter: Secondary | ICD-10-CM

## 2014-09-14 DIAGNOSIS — Y9389 Activity, other specified: Secondary | ICD-10-CM | POA: Insufficient documentation

## 2014-09-14 DIAGNOSIS — T507X2A Poisoning by analeptics and opioid receptor antagonists, intentional self-harm, initial encounter: Secondary | ICD-10-CM | POA: Insufficient documentation

## 2014-09-14 HISTORY — DX: Depression, unspecified: F32.A

## 2014-09-14 HISTORY — DX: Anxiety disorder, unspecified: F41.9

## 2014-09-14 HISTORY — DX: Major depressive disorder, single episode, unspecified: F32.9

## 2014-09-14 LAB — BLOOD GAS, VENOUS
ACID-BASE EXCESS: 2.9 mmol/L (ref 0.0–3.0)
BICARBONATE: 29.8 meq/L — AB (ref 21.0–28.0)
PATIENT TEMPERATURE: 37
PH VEN: 7.35 (ref 7.320–7.430)
pCO2, Ven: 54 mmHg (ref 44.0–60.0)

## 2014-09-14 LAB — URINALYSIS COMPLETE WITH MICROSCOPIC (ARMC ONLY)
BILIRUBIN URINE: NEGATIVE
Bacteria, UA: NONE SEEN
GLUCOSE, UA: NEGATIVE mg/dL
HGB URINE DIPSTICK: NEGATIVE
LEUKOCYTES UA: NEGATIVE
Nitrite: NEGATIVE
Protein, ur: NEGATIVE mg/dL
SPECIFIC GRAVITY, URINE: 1.023 (ref 1.005–1.030)
pH: 6 (ref 5.0–8.0)

## 2014-09-14 LAB — CBC WITH DIFFERENTIAL/PLATELET
Basophils Absolute: 0 10*3/uL (ref 0–0.1)
Basophils Relative: 0 %
Eosinophils Absolute: 0.1 10*3/uL (ref 0–0.7)
Eosinophils Relative: 1 %
HCT: 29.6 % — ABNORMAL LOW (ref 35.0–47.0)
Hemoglobin: 9.7 g/dL — ABNORMAL LOW (ref 12.0–16.0)
LYMPHS ABS: 2.1 10*3/uL (ref 1.0–3.6)
Lymphocytes Relative: 23 %
MCH: 26.2 pg (ref 26.0–34.0)
MCHC: 32.8 g/dL (ref 32.0–36.0)
MCV: 80 fL (ref 80.0–100.0)
MONO ABS: 0.5 10*3/uL (ref 0.2–0.9)
Monocytes Relative: 6 %
NEUTROS ABS: 6.2 10*3/uL (ref 1.4–6.5)
Neutrophils Relative %: 70 %
Platelets: 277 10*3/uL (ref 150–440)
RBC: 3.7 MIL/uL — ABNORMAL LOW (ref 3.80–5.20)
RDW: 14.5 % (ref 11.5–14.5)
WBC: 8.9 10*3/uL (ref 3.6–11.0)

## 2014-09-14 LAB — URINE DRUG SCREEN, QUALITATIVE (ARMC ONLY)
AMPHETAMINES, UR SCREEN: NOT DETECTED
BENZODIAZEPINE, UR SCRN: POSITIVE — AB
Barbiturates, Ur Screen: NOT DETECTED
COCAINE METABOLITE, UR ~~LOC~~: NOT DETECTED
Cannabinoid 50 Ng, Ur ~~LOC~~: NOT DETECTED
MDMA (Ecstasy)Ur Screen: NOT DETECTED
Methadone Scn, Ur: NOT DETECTED
Opiate, Ur Screen: NOT DETECTED
Phencyclidine (PCP) Ur S: NOT DETECTED
Tricyclic, Ur Screen: NOT DETECTED

## 2014-09-14 LAB — COMPREHENSIVE METABOLIC PANEL
ALK PHOS: 74 U/L (ref 38–126)
ALT: 16 U/L (ref 14–54)
AST: 24 U/L (ref 15–41)
Albumin: 4.3 g/dL (ref 3.5–5.0)
Anion gap: 7 (ref 5–15)
BUN: 10 mg/dL (ref 6–20)
CO2: 27 mmol/L (ref 22–32)
CREATININE: 0.89 mg/dL (ref 0.44–1.00)
Calcium: 9.2 mg/dL (ref 8.9–10.3)
Chloride: 104 mmol/L (ref 101–111)
GFR calc Af Amer: >60 mL/min (ref >60)
GLUCOSE: 97 mg/dL (ref 65–99)
POTASSIUM: 3.6 mmol/L (ref 3.5–5.1)
SODIUM: 138 mmol/L (ref 135–145)
TOTAL PROTEIN: 7.2 g/dL (ref 6.5–8.1)
Total Bilirubin: 0.3 mg/dL (ref 0.3–1.2)

## 2014-09-14 LAB — ACETAMINOPHEN LEVEL
Acetaminophen (Tylenol), Serum: 12 ug/mL (ref 10–30)
Acetaminophen (Tylenol), Serum: <10 ug/mL — ABNORMAL LOW (ref 10–30)

## 2014-09-14 LAB — SALICYLATE LEVEL
Salicylate Lvl: <4 mg/dL (ref 2.8–30.0)
Salicylate Lvl: <4 mg/dL (ref 2.8–30.0)

## 2014-09-14 LAB — ETHANOL

## 2014-09-14 MED ORDER — MAGNESIUM HYDROXIDE 400 MG/5ML PO SUSP: 30.0000 mL | Freq: Every day | ORAL | Status: DC | PRN

## 2014-09-14 MED ORDER — ALUM & MAG HYDROXIDE-SIMETH 200-200-20 MG/5ML PO SUSP: 30.0000 mL | ORAL | Status: DC | PRN

## 2014-09-14 MED ORDER — ACETAMINOPHEN 325 MG PO TABS
650.0000 mg | ORAL_TABLET | Freq: Four times a day (QID) | ORAL | Status: DC | PRN
  Administered 2014-09-16: 650 mg via ORAL
  Filled 2014-09-14: qty 2

## 2014-09-14 MED ORDER — PAROXETINE HCL 20 MG PO TABS
20.0000 mg | ORAL_TABLET | Freq: Every day | ORAL | Status: DC
Start: 2014-09-14 — End: 2014-09-17
  Administered 2014-09-14 – 2014-09-17 (×4): 20 mg via ORAL
  Filled 2014-09-14 (×4): qty 1

## 2014-09-14 MED ORDER — SODIUM CHLORIDE 0.9 % IV BOLUS (SEPSIS)
1000.0000 mL | Freq: Once | INTRAVENOUS | Status: AC
  Administered 2014-09-14: 1000 mL via INTRAVENOUS

## 2014-09-14 MED ORDER — CHARCOAL ACTIVATED PO LIQD
50.0000 g | Freq: Once | ORAL | Status: AC
  Administered 2014-09-14: 50 g via ORAL
  Filled 2014-09-14: qty 480

## 2014-09-14 MED ORDER — SODIUM CHLORIDE 0.9 % IV BOLUS (SEPSIS): 1000.0000 mL | Freq: Once | INTRAVENOUS | Status: DC

## 2014-09-14 MED ORDER — ONDANSETRON HCL 4 MG/2ML IJ SOLN
4.0000 mg | Freq: Once | INTRAMUSCULAR | Status: AC
  Administered 2014-09-14: 4 mg via INTRAVENOUS
  Filled 2014-09-14: qty 2

## 2014-09-14 NOTE — ED Notes (Signed)
Pt refuses lunch.  Will keep her lunch and try again later.  Pt did not eat breakfast.

## 2014-09-14 NOTE — ED Notes (Signed)
Pt refuses breakfast.

## 2014-09-14 NOTE — ED Provider Notes (Signed)
[facility] Emergency Department Provider Note  ____________________________________________  Time seen: Approximately 2:06 AM  I have reviewed the triage vital signs and the nursing notes.   HISTORY  Chief Complaint Drug Overdose and Suicidal  Limited by somnolence  HPI Michele [name] is a 29 y.o. female who presents to the ED via EMS s/p intentional overdose of Percocet and Sertraline. Patient is tearful due to recent separation from has been and stressors at home. Admits approximately 2 hours prior patient intentionally took 6-10 Percocet, 6 sertraline and 3 oxycodone.Currently voices no complaints except for depression. Arrives somnolent but arousable to voice and cooperative with exam.  Past medical history Depression   There are no active problems to display for this patient.   Past Surgical History  Procedure Laterality Date  . Cesarean section    . Tonsillectomy    . Knee surgery      Current Outpatient Rx  Name  Route  Sig  Dispense  Refill  . diazepam (VALIUM) 5 MG tablet   Oral   Take 1 tablet (5 mg total) by mouth every 6 (six) hours as needed for anxiety (spasms). Patient not taking: Reported on 08/01/2014   10 tablet   0   . diphenhydrAMINE (BENADRYL) 25 mg capsule   Oral   Take 50 mg by mouth every 6 (six) hours as needed for itching.         Marland Kitchen. HYDROcodone-acetaminophen (NORCO/VICODIN) 5-325 MG per tablet   Oral   Take 2 tablets by mouth every 4 (four) hours as needed for moderate pain. Patient not taking: Reported on 08/01/2014   10 tablet   0   . hydrOXYzine (ATARAX/VISTARIL) 25 MG tablet   Oral   Take 1 tablet (25 mg total) by mouth every 6 (six) hours.   15 tablet   0   . predniSONE (DELTASONE) 10 MG tablet      5,4,3,2,1 - take with food   15 tablet   0   . triamcinolone cream (KENALOG) 0.1 %   Topical   Apply 1 application topically 2 (two) times daily.   45 g   0     Allergies Penicillins and  Sulfa antibiotics  No family history on file.  Social History History  Substance Use Topics  . Smoking status: Never Smoker   . Smokeless tobacco: Not on file  . Alcohol Use: Yes    Review of Systems Constitutional: No fever/chills Eyes: No visual changes. ENT: No sore throat. Cardiovascular: Denies chest pain. Respiratory: Denies shortness of breath. Gastrointestinal: No abdominal pain.  No nausea, no vomiting.  No diarrhea.  No constipation. Genitourinary: Negative for dysuria. Musculoskeletal: Negative for back pain. Skin: Negative for rash. Neurological: Negative for headaches, focal weakness or numbness. Psychiatric:Positive for depression and suicide attempt.  10-point ROS otherwise negative.  ____________________________________________   PHYSICAL EXAM:  VITAL SIGNS: ED Triage Vitals  Enc Vitals Group     BP 09/14/14 0125 147/92 mmHg     Pulse Rate 09/14/14 0125 88     Resp 09/14/14 0125 16     Temp 09/14/14 0125 97.8 F (36.6 C)     Temp Source 09/14/14 0125 Oral     SpO2 09/14/14 0125 96 %     Weight --      Height --      Head Cir --      Peak Flow --      Pain Score --  Pain Loc --      Pain Edu? --      Excl. in GC? --     Constitutional: Alert and oriented. Tearful, somnolent but arousable to voice. Eyes: Conjunctivae are normal. PERRL. EOMI. Head: Atraumatic. Nose: No congestion/rhinnorhea. Mouth/Throat: Mucous membranes are moist.  Oropharynx non-erythematous. Neck: No stridor.   Cardiovascular: Normal rate, regular rhythm. Grossly normal heart sounds.  Good peripheral circulation. Respiratory: Normal respiratory effort.  No retractions. Lungs CTAB. Gastrointestinal: Soft and nontender. No distention. No abdominal bruits. No CVA tenderness. Musculoskeletal: No lower extremity tenderness nor edema.  No joint effusions. Neurologic:  Slurred speech and language. No gross focal neurologic deficits are appreciated.  Skin:  Skin is warm,  dry and intact. No rash noted. Superficial cuts to left wrist Psychiatric: Mood and affect are flat. Speech and behavior are flat.  ____________________________________________   LABS (all labs ordered are listed, but only abnormal results are displayed)  Labs Reviewed  CBC WITH DIFFERENTIAL/PLATELET - Abnormal; Notable for the following:    RBC 3.70 (*)    Hemoglobin 9.7 (*)    HCT 29.6 (*)    All other components within normal limits  COMPREHENSIVE METABOLIC PANEL  ETHANOL  ACETAMINOPHEN LEVEL  SALICYLATE LEVEL  BLOOD GAS, ARTERIAL  ACETAMINOPHEN LEVEL  SALICYLATE LEVEL   ____________________________________________  EKG  ED ECG REPORT I, SUNG,JADE J, the attending physician, personally viewed and interpreted this ECG.   Date: 09/14/2014  EKG Time: 0239  Rate: 95  Rhythm: normal EKG, normal sinus rhythm  Axis: Normal  Intervals:none  ST&T Change: Nonspecific  ____________________________________________  RADIOLOGY  Portable chest x-ray (viewed by me, interpreted per Dr. Andria Meuse): No active disease. ____________________________________________   PROCEDURES  Procedure(s) performed: None  Critical Care performed: No  ____________________________________________   INITIAL IMPRESSION / ASSESSMENT AND PLAN / ED COURSE  Pertinent labs & imaging results that were available during my care of the patient were reviewed by me and considered in my medical decision making (see chart for details).  29 year old female who presents s/p intentional overdose of sertraline and opiates. Will initiate charcoal, IV fluid resuscitation, check screening lab work including acetaminophen and salicylate levels and observe patient in the emergency department. Will place patient under involuntary commitment for her safety; TTS and psychiatry consulted to evaluate patient in the emergency department.  ----------------------------------------- 5:26 AM on  09/14/2014 -----------------------------------------  Zofran given for patient vomiting. Second liter IV fluids initiated. Will repeat 4 hour acetaminophen and salicylate level.  ----------------------------------------- 6:37 AM on 09/14/2014 -----------------------------------------  Resting in no acute distress. Awaiting repeat acetaminophen, salicylate and venous blood gas levels. Patient remains under IVC pending psychiatry consult this morning. If repeat levels remain normal, patient may be medically cleared pending psychiatry disposition.   ____________________________________________   FINAL CLINICAL IMPRESSION(S) / ED DIAGNOSES  Final diagnoses:  Depression  Intentional overdose of beta-adrenergic blocking drug, initial encounter  Suicide attempt      Irean Hong, MD 09/14/14 [phone]

## 2014-09-14 NOTE — ED Notes (Signed)
SpO2 100%

## 2014-09-14 NOTE — B[facility] Counselor (Signed)
[facility]," and due to presenting problem, intent, and seriousness of suicide attempt, writer was instructed to start the process of getting the patient moved/[facility].  Writer discussed with On Call Psych MD (Dr. Toni Amendlapacs). He will put the orders in to be admitted. If patient is in the ER when he is rounding, he will see them, if not they will be seen while on the unit.  Pt. [facility] by Dr. Toni Amendlapacs. Attending Physician will be Dr. Jennet MaduroPucilowska.  Pt. has been assigned to room 310, [facility] Charge Nurse Alvira PhilipsEric J.  Intake Paper Work has been signed and placed on pt. chart. ER staff Glendon Axe(Luanne, ER Sect; Christy Patient Access) have been made aware of the admission.

## 2014-09-14 NOTE — Tx Team (Signed)
Initial Interdisciplinary Treatment Plan   PATIENT STRESSORS: Marital or family conflict Occupational concerns   PATIENT STRENGTHS: Capable of independent living Communication skills   PROBLEM LIST: Problem List/Patient Goals Date to be addressed Date deferred Reason deferred Estimated date of resolution  depression      Suicide attempt                                                 DISCHARGE CRITERIA:  Improved stabilization in mood, thinking, and/or behavior Motivation to continue treatment in a less acute level of care  PRELIMINARY DISCHARGE PLAN: Outpatient therapy Return to previous living arrangement  PATIENT/FAMIILY INVOLVEMENT: This treatment plan has been presented to and reviewed with the patient, Michele [name], and/or family member.  The patient and family have been given the opportunity to ask questions and make suggestions.  Michele [name] 09/14/2014, 5:34 PM

## 2014-09-14 NOTE — ED Notes (Signed)
Poison control updated regarding pt status

## 2014-09-14 NOTE — ED Notes (Signed)
Pt asked a second time if she would like to have her breakfast.  Pt again refused.

## 2014-09-14 NOTE — ED Notes (Signed)
No sitter present.  Pt denies suicidal ideation.  MD informed and asked to d/c sitter.  Sitter d/c'd.

## 2014-09-14 NOTE — ED Notes (Addendum)
BEHAVIORAL HEALTH ROUNDING Patient sleeping: No. Patient alert and oriented: yes Behavior appropriate: Yes.  ; If no, describe:  Nutrition and fluids offered: No Toileting and hygiene offered: Yes  Sitter present: yes Law enforcement present: Yes  

## 2014-09-14 NOTE — ED Notes (Addendum)
75% SpO2 error reading.  Correct 100%.

## 2014-09-14 NOTE — ED Notes (Signed)
BEHAVIORAL HEALTH ROUNDING Patient sleeping: No. Patient alert and oriented: yes Behavior appropriate: Yes.  ; If no, describe:  Nutrition and fluids offered: Yes  Toileting and hygiene offered: Yes  Sitter present: not applicable Law enforcement present: Yes  

## 2014-09-14 NOTE — ED Notes (Signed)
BEHAVIORAL HEALTH ROUNDING Patient sleeping: Yes.   Patient alert and oriented: not applicable Behavior appropriate: Yes.  ; If no, describe:  Nutrition and fluids offered: No Toileting and hygiene offered: No Sitter present: not applicable Law enforcement present: Yes  

## 2014-09-14 NOTE — Consult Note (Signed)
[place] Psychiatry Consult   Reason for Consult:  Consult for 29 year old woman with a history of depression who comes to the hospital after taking an overdose of narcotics Referring Physician:  Thomasene Lot Patient Identification: Michele [name] MRN:  [id number] Principal Diagnosis: Major depression Diagnosis:   Patient Active Problem List   Diagnosis Date Noted  . Major depression [F32.2] 09/14/2014  . Intentional opiate overdose [T40.602A] 09/14/2014  . Suicidal ideation [R45.851] 09/14/2014    Total Time spent with patient: 1 hour  Subjective:   Michele [name] is a 29 y.o. female patient admitted with "everything just blew up". Multiple depressive symptoms with suicidal behavior  HPI:  Information from the patient and the chart. Patient evaluated in the emergency room. She states that she has had depression for a long long time but it has been getting worse for the last month or so. She and her husband split up this past week. She has been staying with her parents. Both of those are major stresses. She has been trying to continue working regularly. Patient has been suffering for poor sleep and poor appetite for at least a month. Has been having suicidal thoughts. Worries constantly about her children who are now having to go back and forth between her and her husband. Patient states that she took Percocet and possibly other pain medicine in a suicide attempt. After she did it she began to feel sick and then she made an effort to get help. She is not currently taking any psychiatric medicine.  Past history of long-standing depression. Multiple medications have been tried in the past. She recalls Zoloft and Paxil having been somewhat helpful. [facility] has [facility]. Denies prior hospitalizations or suicide attempts.  Social history: Patient is employed. Recently split up with her husband. Has 4 children under the age of 7 who are now going  back and forth between them. Finds living with her parents very difficult.   family history: Biological mother severe anxiety and depression  Medical history: Overweight. Status post knee surgery. Mild chronic joint pain no other acute medical problems.  Substance abuse history: Rarely drinks but admits that she did yesterday. Denies use of any other drugs. HPI Elements:   Quality:  Severe depression with suicidal ideation and behavior. Severity:  Severe and life-threatening. Timing:  Ongoing but happened to make suicide attempt yesterday. Duration:  Chronic but worsening. Context:  Recent breakup with her husband.  Past Medical History:  Past Medical History  Diagnosis Date  . Depression     Past Surgical History  Procedure Laterality Date  . Cesarean section    . Tonsillectomy    . Knee surgery     Family History:  Family History  Problem Relation Age of Onset  . Mental illness Father   . Mental illness Sister    Social History:  History  Alcohol Use  . Yes    Comment: rare     History  Drug Use No    History   Social History  . Marital Status: Legally Separated    Spouse Name: N/A  . Number of Children: N/A  . Years of Education: N/A   Social History Main Topics  . Smoking status: Never Smoker   . Smokeless tobacco: Never Used  . Alcohol Use: Yes     Comment: rare  . Drug Use: No  . Sexual Activity: No   Other Topics Concern  . None   Social  History Narrative   Additional Social History:    Pain Medications: none History of alcohol / drug use?: No history of alcohol / drug abuse                     Allergies:   Allergies  Allergen Reactions  . Penicillins Hives  . Sulfa Antibiotics Hives    Labs:  Results for orders placed or performed during the hospital encounter of 09/14/14 (from the past 48 hour(s))  CBC with Differential     Status: Abnormal   Collection Time: 09/14/14  1:55 AM  Result Value Ref Range   WBC 8.9 3.6 - 11.0  K/uL   RBC 3.70 (L) 3.80 - 5.20 MIL/uL   Hemoglobin 9.7 (L) 12.0 - 16.0 g/dL   HCT 29.6 (L) 35.0 - 47.0 %   MCV 80.0 80.0 - 100.0 fL   MCH 26.2 26.0 - 34.0 pg   MCHC 32.8 32.0 - 36.0 g/dL   RDW 14.5 11.5 - 14.5 %   Platelets 277 150 - 440 K/uL   Neutrophils Relative % 70 %   Neutro Abs 6.2 1.4 - 6.5 K/uL   Lymphocytes Relative 23 %   Lymphs Abs 2.1 1.0 - 3.6 K/uL   Monocytes Relative 6 %   Monocytes Absolute 0.5 0.2 - 0.9 K/uL   Eosinophils Relative 1 %   Eosinophils Absolute 0.1 0 - 0.7 K/uL   Basophils Relative 0 %   Basophils Absolute 0.0 0 - 0.1 K/uL  Comprehensive metabolic panel     Status: None   Collection Time: 09/14/14  1:55 AM  Result Value Ref Range   Sodium 138 135 - 145 mmol/L   Potassium 3.6 3.5 - 5.1 mmol/L   Chloride 104 101 - 111 mmol/L   CO2 27 22 - 32 mmol/L   Glucose, Bld 97 65 - 99 mg/dL   BUN 10 6 - 20 mg/dL   Creatinine, Ser 0.89 0.44 - 1.00 mg/dL   Calcium 9.2 8.9 - 10.3 mg/dL   Total Protein 7.2 6.5 - 8.1 g/dL   Albumin 4.3 3.5 - 5.0 g/dL   AST 24 15 - 41 U/L   ALT 16 14 - 54 U/L   Alkaline Phosphatase 74 38 - 126 U/L   Total Bilirubin 0.3 0.3 - 1.2 mg/dL   GFR calc non Af Amer >60 >60 mL/min   GFR calc Af Amer >60 >60 mL/min    Comment: (NOTE) The eGFR has been calculated using the CKD EPI equation. This calculation has not been validated in all clinical situations. eGFR's persistently <60 mL/min signify possible Chronic Kidney Disease.    Anion gap 7 5 - 15  Ethanol     Status: None   Collection Time: 09/14/14  1:55 AM  Result Value Ref Range   Alcohol, Ethyl (B) <5 <5 mg/dL    Comment:        LOWEST DETECTABLE LIMIT FOR SERUM ALCOHOL IS 5 mg/dL FOR MEDICAL PURPOSES ONLY   Acetaminophen level     Status: None   Collection Time: 09/14/14  1:55 AM  Result Value Ref Range   Acetaminophen (Tylenol), Serum 12 10 - 30 ug/mL    Comment:        THERAPEUTIC CONCENTRATIONS VARY SIGNIFICANTLY. A RANGE OF 10-30 ug/mL MAY BE AN  EFFECTIVE CONCENTRATION FOR MANY PATIENTS. HOWEVER, SOME ARE BEST TREATED AT CONCENTRATIONS OUTSIDE THIS RANGE. ACETAMINOPHEN CONCENTRATIONS >150 ug/mL AT 4 HOURS AFTER INGESTION AND >50 ug/mL AT 12  HOURS AFTER INGESTION ARE OFTEN ASSOCIATED WITH TOXIC REACTIONS.   Salicylate level     Status: None   Collection Time: 09/14/14  1:55 AM  Result Value Ref Range   Salicylate Lvl <8.0 2.8 - 30.0 mg/dL  Acetaminophen level     Status: Abnormal   Collection Time: 09/14/14  6:53 AM  Result Value Ref Range   Acetaminophen (Tylenol), Serum <10 (L) 10 - 30 ug/mL    Comment:        THERAPEUTIC CONCENTRATIONS VARY SIGNIFICANTLY. A RANGE OF 10-30 ug/mL MAY BE AN EFFECTIVE CONCENTRATION FOR MANY PATIENTS. HOWEVER, SOME ARE BEST TREATED AT CONCENTRATIONS OUTSIDE THIS RANGE. ACETAMINOPHEN CONCENTRATIONS >150 ug/mL AT 4 HOURS AFTER INGESTION AND >50 ug/mL AT 12 HOURS AFTER INGESTION ARE OFTEN ASSOCIATED WITH TOXIC REACTIONS.   Salicylate level     Status: None   Collection Time: 09/14/14  6:53 AM  Result Value Ref Range   Salicylate Lvl <9.9 2.8 - 30.0 mg/dL  Blood gas, venous     Status: Abnormal   Collection Time: 09/14/14  7:15 AM  Result Value Ref Range   pH, Ven 7.35 7.320 - 7.430   pCO2, Ven 54 44.0 - 60.0 mmHg   Bicarbonate 29.8 (H) 21.0 - 28.0 mEq/L   Acid-Base Excess 2.9 0.0 - 3.0 mmol/L   Patient temperature 37.0    Collection site VENOUS    Sample type VENOUS   Urine Drug Screen, Qualitative ([facility] only)     Status: Abnormal   Collection Time: 09/14/14 11:56 AM  Result Value Ref Range   Tricyclic, Ur Screen NONE DETECTED NONE DETECTED   Amphetamines, Ur Screen NONE DETECTED NONE DETECTED   MDMA (Ecstasy)Ur Screen NONE DETECTED NONE DETECTED   Cocaine Metabolite,Ur [place] NONE DETECTED NONE DETECTED   Opiate, Ur Screen NONE DETECTED NONE DETECTED   Phencyclidine (PCP) Ur S NONE DETECTED NONE DETECTED   Cannabinoid 50 Ng, Ur  NONE DETECTED NONE DETECTED   Barbiturates,  Ur Screen NONE DETECTED NONE DETECTED   Benzodiazepine, Ur Scrn POSITIVE (A) NONE DETECTED   Methadone Scn, Ur NONE DETECTED NONE DETECTED    Comment: (NOTE) 833  Tricyclics, urine               Cutoff 1000 ng/mL 200  Amphetamines, urine             Cutoff 1000 ng/mL 300  MDMA (Ecstasy), urine           Cutoff 500 ng/mL 400  Cocaine Metabolite, urine       Cutoff 300 ng/mL 500  Opiate, urine                   Cutoff 300 ng/mL 600  Phencyclidine (PCP), urine      Cutoff 25 ng/mL 700  Cannabinoid, urine              Cutoff 50 ng/mL 800  Barbiturates, urine             Cutoff 200 ng/mL 900  Benzodiazepine, urine           Cutoff 200 ng/mL 1000 Methadone, urine                Cutoff 300 ng/mL 1100 1200 The urine drug screen provides only a preliminary, unconfirmed 1300 analytical test result and should not be used for non-medical 1400 purposes. Clinical consideration and professional judgment should 1500 be applied to any positive drug screen result due to possible 1600 interfering substances. A  more specific alternate chemical method 1700 must be used in order to obtain a confirmed analytical result.  1800 Gas chromato graphy / mass spectrometry (GC/MS) is the preferred 1900 confirmatory method.   Urinalysis complete, with microscopic ([facility] only)     Status: Abnormal   Collection Time: 09/14/14 11:56 AM  Result Value Ref Range   Color, Urine YELLOW (A) YELLOW   APPearance CLEAR (A) CLEAR   Glucose, UA NEGATIVE NEGATIVE mg/dL   Bilirubin Urine NEGATIVE NEGATIVE   Ketones, ur TRACE (A) NEGATIVE mg/dL   Specific Gravity, Urine 1.023 1.005 - 1.030   Hgb urine dipstick NEGATIVE NEGATIVE   pH 6.0 5.0 - 8.0   Protein, ur NEGATIVE NEGATIVE mg/dL   Nitrite NEGATIVE NEGATIVE   Leukocytes, UA NEGATIVE NEGATIVE   RBC / HPF 0-5 0 - 5 RBC/hpf   WBC, UA 0-5 0 - 5 WBC/hpf   Bacteria, UA NONE SEEN NONE SEEN   Squamous Epithelial / LPF 0-5 (A) NONE SEEN   Mucous PRESENT     Vitals: Blood  pressure 125/88, pulse 95, temperature 98.6 F (37 C), temperature source Oral, resp. rate 20, height '5\' 5"'  (1.651 m), weight 108.41 kg (239 lb), last menstrual period 08/01/2014, SpO2 97 %.  Risk to Self: Is patient at risk for suicide?: No Risk to Others:   Prior Inpatient Therapy:   Prior Outpatient Therapy:    Current Facility-Administered Medications  Medication Dose Route Frequency Provider Last Rate Last Dose  . acetaminophen (TYLENOL) tablet 650 mg  650 mg Oral Q6H PRN Gonzella Lex, MD      . alum & mag hydroxide-simeth (MAALOX/MYLANTA) 200-200-20 MG/5ML suspension 30 mL  30 mL Oral Q4H PRN Gonzella Lex, MD      . magnesium hydroxide (MILK OF MAGNESIA) suspension 30 mL  30 mL Oral Daily PRN Gonzella Lex, MD        Musculoskeletal: Strength & Muscle Tone: decreased Gait & Station: unsteady Patient leans: N/A  Psychiatric Specialty Exam: Physical Exam  Constitutional: She appears well-developed and well-nourished.  HENT:  Head: Normocephalic and atraumatic.  Eyes: Conjunctivae are normal. Pupils are equal, round, and reactive to light.  Neck: Normal range of motion.  Cardiovascular: Normal heart sounds.   Respiratory: Effort normal.  GI: Soft.  Musculoskeletal: Normal range of motion.  Neurological: She is alert.  Skin: Skin is warm and dry.  Psychiatric: Her speech is delayed. She is slowed. Cognition and memory are normal. She expresses impulsivity. She exhibits a depressed mood. She expresses suicidal ideation.    Review of Systems  Constitutional: Negative.   HENT: Negative.   Eyes: Negative.   Respiratory: Negative.   Cardiovascular: Negative.   Gastrointestinal: Negative.   Musculoskeletal: Positive for joint pain.  Skin: Negative.   Neurological: Negative.   Psychiatric/Behavioral: Positive for depression, suicidal ideas and memory loss. The patient is nervous/anxious and has insomnia.     Blood pressure 125/88, pulse 95, temperature 98.6 F (37 C),  temperature source Oral, resp. rate 20, height '5\' 5"'  (1.651 m), weight 108.41 kg (239 lb), last menstrual period 08/01/2014, SpO2 97 %.Body mass index is 39.77 kg/(m^2).  General Appearance: Disheveled  Eye Sport and exercise psychologist::  Fair  Speech:  Slow  Volume:  Decreased  Mood:  Depressed  Affect:  Constricted and Depressed  Thought Process:  Tangential  Orientation:  Full (Time, Place, and Person)  Thought Content:  Negative  Suicidal Thoughts:  Yes.  with intent/plan  Homicidal Thoughts:  No  Memory:  Immediate;   Good Recent;   Fair Remote;   Fair  Judgement:  Impaired  Insight:  Shallow  Psychomotor Activity:  Decreased  Concentration:  Fair  Recall:  [employer] of Knowledge:Fair  Language: Fair  Akathisia:  No  Handed:  Right  AIMS (if indicated):     Assets:  Communication Skills Desire for Improvement Financial Resources/Insurance Physical Health Social Support  ADL's:  Intact  Cognition: WNL  Sleep:      Medical Decision Making: New problem, with additional work up planned, Review of Psycho-Social Stressors (1), Review or order clinical lab tests (1) and Review of New Medication or Change in Dosage (2)  Treatment Plan Summary: Medication management and Plan Patient took intentional overdose of Percocet. She has had stomach lavage and appears to be medically stabilized. Severely depressed. Patient will be admitted to the psychiatry service. Restart her on Paxil for depression and anxiety. Supportive counseling.  Plan:  Recommend psychiatric Inpatient admission when medically cleared. Supportive therapy provided about ongoing stressors. Disposition: Admit to psychiatry  Alethia Berthold 09/14/2014 5:37 PM

## 2014-09-14 NOTE — ED Notes (Signed)
[facility]. Pt was said to have taken 6-10 percocet, 6 sertraline, and 3 oxycodone. Pt states she does not want to live anymore due to her recent separation from her husband and stressors at home. superficial cuts to left wrist also noted. Pt eyes closed but arousable. Tearful when quesetioned but appears willing to cooperative. Poison control called prior to pt arrival. Hx depression.

## 2014-09-14 NOTE — ED Notes (Signed)
Pt with Dr. Toni Amendlapacs.

## 2014-09-14 NOTE — ED Notes (Signed)

## 2014-09-14 NOTE — ED Notes (Signed)
Pt accepts phone call from mother. 

## 2014-09-14 NOTE — ED Notes (Signed)
Per RN report, pt has received two liters of NS, which has completed orders for IV fluids.

## 2014-09-14 NOTE — ED Notes (Signed)
Pt ambulatory to restroom with stand by assistance from ED tech. Steady gait noted. No distress. Pt states she currently doesn't have any pain and is "just tired"

## 2014-09-14 NOTE — ED Notes (Signed)
Family at bedside.  Mother visiting.

## 2014-09-14 NOTE — ED Notes (Signed)
Medication bottle with four caplets found on desk.  Label reads Sertraline 50 mg.  Discard after 09/07/2013.  Start 09/07/2012.  May refill twice by 09/07/2013.  Medication storage form completed.  Form and medication tubed to Pharmacy.

## 2014-09-14 NOTE — Progress Notes (Signed)
Admitted ambulatory from the ED, a 29 year old female with a diagnosis of depression. Patient presented to ED after impulsively taking an overdose of left over medication from previous conditions in a suicide attempt. She also superficially cut her L wrist with a razor. Patient immediately regretted what she did and called her mother to take her to the hospital.  See admission database for complete clinical data.  Patient now denies suicidal thoughts and feels safe in the hospital. She will alert staff if self harm feelings reoccur. A search was conducted and there was no contraband found. Verifying nurse was Minerva AreolaEric, Charity fundraiserN.

## 2014-09-14 NOTE — ED Notes (Signed)
BEHAVIORAL HEALTH ROUNDING Patient sleeping: Yes.   Patient alert and oriented: sleeping Behavior appropriate: Yes.  ; If no, describe: sleeping Nutrition and fluids offered: sleeping Toileting and hygiene offered: sleeping Sitter present: no Law enforcement present: Yes  and ODS 

## 2014-09-14 NOTE — ED Notes (Signed)
Patient denies pain and is resting comfortably.  

## 2014-09-14 NOTE — ED Notes (Signed)
Family at bedside. Husband visiting.

## 2014-09-14 NOTE — ED Notes (Signed)
Pt vomiting. Awake. MD aware.
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
## 2014-09-15 ENCOUNTER — Encounter: Payer: Self-pay | Admitting: Psychiatry

## 2014-09-15 DIAGNOSIS — F332 Major depressive disorder, recurrent severe without psychotic features: Secondary | ICD-10-CM

## 2014-09-15 DIAGNOSIS — T40602A Poisoning by unspecified narcotics, intentional self-harm, initial encounter: Secondary | ICD-10-CM

## 2014-09-15 NOTE — H&P (Signed)
Michele [name] is an 29 y.o. female.   Chief Complaint: Over dose   HPI: Information from the patient and the chart.   Patient is a 46 over female who was admitted due to worsening of her depression symptoms. She reported that she has long history of depression which has been getting worse for the past month or so. She has been married for the past 11 years and she decided to split with her husband due to relational problems. They have been separated since Sunday. Patient reported that she became progressively depressed and was feeling hopeless helpless and started having suicidal thoughts. She reported that she has 4 children ages 63, 37, 14 and 3 years old and they have the joint custody of the children. She decided to overdose on the pills as she was unhappy and does not know what to do. She has been experiencing poor sleep poor appetite for at least a month. Has been having suicidal thoughts. Worries constantly about her children who are now having to go back and forth between her and her husband. Patient states that she took Percocet and possibly other pain medicine in a suicide attempt. After she did it she began to feel sick and then she made an effort to get help. She is not currently taking any psychiatric medicine.  Past history of long-standing depression. Multiple medications have been tried in the past. She recalls Zoloft and Paxil having been somewhat helpful. [facility]. Denies prior hospitalizations or suicide attempts.  Social history: Patient is employed. Recently split up with her husband. Has 4 children under the age of 59 who are now going back and forth between them. Finds living with her parents very difficult.  family history: Biological mother severe anxiety and depression  Medical history: Overweight. Status post knee surgery. Mild chronic joint pain no other acute medical problems.  Substance abuse history: Rarely drinks but  admits that she did yesterday. Denies use of any other drugs. HPI Elements: Quality: Severe depression with suicidal ideation and behavior. Severity: Severe and life-threatening. Timing: Ongoing but happened to make suicide attempt yesterday. Duration: Chronic but worsening. Context: Recent breakup with her husband   Past Medical History  Diagnosis Date  . Depression     Past Surgical History  Procedure Laterality Date  . Cesarean section    . Tonsillectomy    . Knee surgery      Family History  Problem Relation Age of Onset  . Mental illness Father   . Mental illness Sister    Social History:  reports that she has never smoked. She has never used smokeless tobacco. She reports that she drinks alcohol. She reports that she does not use illicit drugs.  Allergies:  Allergies  Allergen Reactions  . Penicillins Hives  . Sulfa Antibiotics Hives    Medications Prior to Admission  Medication Sig Dispense Refill  . diphenhydrAMINE (BENADRYL) 25 mg capsule Take 50 mg by mouth every 6 (six) hours as needed for itching.    . hydrOXYzine (ATARAX/VISTARIL) 25 MG tablet Take 1 tablet (25 mg total) by mouth every 6 (six) hours. (Patient not taking: Reported on 09/14/2014) 15 tablet 0  . predniSONE (DELTASONE) 10 MG tablet 5,4,3,2,1 - take with food (Patient not taking: Reported on 09/14/2014) 15 tablet 0  . triamcinolone cream (KENALOG) 0.1 % Apply 1 application topically 2 (two) times daily. (Patient not taking: Reported on 09/14/2014) 45 g 0    Results  for orders placed or performed during the hospital encounter of 09/14/14 (from the past 48 hour(s))  CBC with Differential     Status: Abnormal   Collection Time: 09/14/14  1:55 AM  Result Value Ref Range   WBC 8.9 3.6 - 11.0 K/uL   RBC 3.70 (L) 3.80 - 5.20 MIL/uL   Hemoglobin 9.7 (L) 12.0 - 16.0 g/dL   HCT 29.6 (L) 35.0 - 47.0 %   MCV 80.0 80.0 - 100.0 fL   MCH 26.2 26.0 - 34.0 pg   MCHC 32.8 32.0 - 36.0 g/dL   RDW 14.5  11.5 - 14.5 %   Platelets 277 150 - 440 K/uL   Neutrophils Relative % 70 %   Neutro Abs 6.2 1.4 - 6.5 K/uL   Lymphocytes Relative 23 %   Lymphs Abs 2.1 1.0 - 3.6 K/uL   Monocytes Relative 6 %   Monocytes Absolute 0.5 0.2 - 0.9 K/uL   Eosinophils Relative 1 %   Eosinophils Absolute 0.1 0 - 0.7 K/uL   Basophils Relative 0 %   Basophils Absolute 0.0 0 - 0.1 K/uL  Comprehensive metabolic panel     Status: None   Collection Time: 09/14/14  1:55 AM  Result Value Ref Range   Sodium 138 135 - 145 mmol/L   Potassium 3.6 3.5 - 5.1 mmol/L   Chloride 104 101 - 111 mmol/L   CO2 27 22 - 32 mmol/L   Glucose, Bld 97 65 - 99 mg/dL   BUN 10 6 - 20 mg/dL   Creatinine, Ser 0.89 0.44 - 1.00 mg/dL   Calcium 9.2 8.9 - 10.3 mg/dL   Total Protein 7.2 6.5 - 8.1 g/dL   Albumin 4.3 3.5 - 5.0 g/dL   AST 24 15 - 41 U/L   ALT 16 14 - 54 U/L   Alkaline Phosphatase 74 38 - 126 U/L   Total Bilirubin 0.3 0.3 - 1.2 mg/dL   GFR calc non Af Amer >60 >60 mL/min   GFR calc Af Amer >60 >60 mL/min    Comment: (NOTE) The eGFR has been calculated using the CKD EPI equation. This calculation has not been validated in all clinical situations. eGFR's persistently <60 mL/min signify possible Chronic Kidney Disease.    Anion gap 7 5 - 15  Ethanol     Status: None   Collection Time: 09/14/14  1:55 AM  Result Value Ref Range   Alcohol, Ethyl (B) <5 <5 mg/dL    Comment:        LOWEST DETECTABLE LIMIT FOR SERUM ALCOHOL IS 5 mg/dL FOR MEDICAL PURPOSES ONLY   Acetaminophen level     Status: None   Collection Time: 09/14/14  1:55 AM  Result Value Ref Range   Acetaminophen (Tylenol), Serum 12 10 - 30 ug/mL    Comment:        THERAPEUTIC CONCENTRATIONS VARY SIGNIFICANTLY. A RANGE OF 10-30 ug/mL MAY BE AN EFFECTIVE CONCENTRATION FOR MANY PATIENTS. HOWEVER, SOME ARE BEST TREATED AT CONCENTRATIONS OUTSIDE THIS RANGE. ACETAMINOPHEN CONCENTRATIONS >150 ug/mL AT 4 HOURS AFTER INGESTION AND >50 ug/mL AT 12 HOURS AFTER  INGESTION ARE OFTEN ASSOCIATED WITH TOXIC REACTIONS.   Salicylate level     Status: None   Collection Time: 09/14/14  1:55 AM  Result Value Ref Range   Salicylate Lvl <2.5 2.8 - 30.0 mg/dL  Acetaminophen level     Status: Abnormal   Collection Time: 09/14/14  6:53 AM  Result Value Ref Range   Acetaminophen (Tylenol), Serum <  10 (L) 10 - 30 ug/mL    Comment:        THERAPEUTIC CONCENTRATIONS VARY SIGNIFICANTLY. A RANGE OF 10-30 ug/mL MAY BE AN EFFECTIVE CONCENTRATION FOR MANY PATIENTS. HOWEVER, SOME ARE BEST TREATED AT CONCENTRATIONS OUTSIDE THIS RANGE. ACETAMINOPHEN CONCENTRATIONS >150 ug/mL AT 4 HOURS AFTER INGESTION AND >50 ug/mL AT 12 HOURS AFTER INGESTION ARE OFTEN ASSOCIATED WITH TOXIC REACTIONS.   Salicylate level     Status: None   Collection Time: 09/14/14  6:53 AM  Result Value Ref Range   Salicylate Lvl <0.1 2.8 - 30.0 mg/dL  Blood gas, venous     Status: Abnormal   Collection Time: 09/14/14  7:15 AM  Result Value Ref Range   pH, Ven 7.35 7.320 - 7.430   pCO2, Ven 54 44.0 - 60.0 mmHg   Bicarbonate 29.8 (H) 21.0 - 28.0 mEq/L   Acid-Base Excess 2.9 0.0 - 3.0 mmol/L   Patient temperature 37.0    Collection site VENOUS    Sample type VENOUS   Urine Drug Screen, Qualitative ([facility] only)     Status: Abnormal   Collection Time: 09/14/14 11:56 AM  Result Value Ref Range   Tricyclic, Ur Screen NONE DETECTED NONE DETECTED   Amphetamines, Ur Screen NONE DETECTED NONE DETECTED   MDMA (Ecstasy)Ur Screen NONE DETECTED NONE DETECTED   Cocaine Metabolite,Ur Stella NONE DETECTED NONE DETECTED   Opiate, Ur Screen NONE DETECTED NONE DETECTED   Phencyclidine (PCP) Ur S NONE DETECTED NONE DETECTED   Cannabinoid 50 Ng, Ur Hendricks NONE DETECTED NONE DETECTED   Barbiturates, Ur Screen NONE DETECTED NONE DETECTED   Benzodiazepine, Ur Scrn POSITIVE (A) NONE DETECTED   Methadone Scn, Ur NONE DETECTED NONE DETECTED    Comment: (NOTE) 007  Tricyclics, urine               Cutoff 1000  ng/mL 200  Amphetamines, urine             Cutoff 1000 ng/mL 300  MDMA (Ecstasy), urine           Cutoff 500 ng/mL 400  Cocaine Metabolite, urine       Cutoff 300 ng/mL 500  Opiate, urine                   Cutoff 300 ng/mL 600  Phencyclidine (PCP), urine      Cutoff 25 ng/mL 700  Cannabinoid, urine              Cutoff 50 ng/mL 800  Barbiturates, urine             Cutoff 200 ng/mL 900  Benzodiazepine, urine           Cutoff 200 ng/mL 1000 Methadone, urine                Cutoff 300 ng/mL 1100 1200 The urine drug screen provides only a preliminary, unconfirmed 1300 analytical test result and should not be used for non-medical 1400 purposes. Clinical consideration and professional judgment should 1500 be applied to any positive drug screen result due to possible 1600 interfering substances. A more specific alternate chemical method 1700 must be used in order to obtain a confirmed analytical result.  1800 Gas chromato graphy / mass spectrometry (GC/MS) is the preferred 1900 confirmatory method.   Urinalysis complete, with microscopic ([facility] only)     Status: Abnormal   Collection Time: 09/14/14 11:56 AM  Result Value Ref Range   Color, Urine YELLOW (A) YELLOW   APPearance  CLEAR (A) CLEAR   Glucose, UA NEGATIVE NEGATIVE mg/dL   Bilirubin Urine NEGATIVE NEGATIVE   Ketones, ur TRACE (A) NEGATIVE mg/dL   Specific Gravity, Urine 1.023 1.005 - 1.030   Hgb urine dipstick NEGATIVE NEGATIVE   pH 6.0 5.0 - 8.0   Protein, ur NEGATIVE NEGATIVE mg/dL   Nitrite NEGATIVE NEGATIVE   Leukocytes, UA NEGATIVE NEGATIVE   RBC / HPF 0-5 0 - 5 RBC/hpf   WBC, UA 0-5 0 - 5 WBC/hpf   Bacteria, UA NONE SEEN NONE SEEN   Squamous Epithelial / LPF 0-5 (A) NONE SEEN   Mucous PRESENT    Dg Chest Port 1 View  09/14/2014   CLINICAL DATA:  Possible overdose.  EXAM: PORTABLE CHEST - 1 VIEW  COMPARISON:  09/10/2013  FINDINGS: Shallow inspiration. Normal heart size and pulmonary vascularity. No focal airspace disease  or consolidation in the lungs. No blunting of costophrenic angles. No pneumothorax. Mediastinal contours appear intact.  IMPRESSION: No active disease.   Electronically Signed   By: Lucienne Capers M.D.   On: 09/14/2014 03:24    Review of Systems  Constitutional: Negative.   HENT: Negative.   Eyes: Negative.   Respiratory: Negative.   Cardiovascular: Negative.   Gastrointestinal: Negative.   Skin: Negative.   Neurological: Negative.   Endo/Heme/Allergies: Negative.   Psychiatric/Behavioral: Positive for depression and suicidal ideas. The patient is nervous/anxious and has insomnia.     Blood pressure 105/72, pulse 102, temperature 98.6 F (37 C), temperature source Oral, resp. rate 20, height '5\' 5"'  (1.651 m), weight 239 lb (108.41 kg), last menstrual period 08/01/2014, SpO2 97 %. Physical Exam    General Appearance: Disheveled  Eye Contact:: Fair  Speech: Slow  Volume: Decreased  Mood: Depressed  Affect: Constricted and Depressed  Thought Process: Tangential  Orientation: Full (Time, Place, and Person)  Thought Content: Negative  Suicidal Thoughts: Yes. with intent/plan  Homicidal Thoughts: No  Memory: Immediate; Good Recent; Fair Remote; Fair  Judgement: Impaired  Insight: Shallow  Psychomotor Activity: Decreased  Concentration: Fair  Recall: [employer] of Knowledge:Fair  Language: Fair  Akathisia: No  Handed: Right  AIMS (if indicated):    Assets: Communication Skills Desire for Improvement Financial Resources/Insurance Physical Health Social Support  ADL's: Intact  Cognition: WNL  Sleep:     Medical Decision Making: New problem, with additional work up planned, Review of Psycho-Social Stressors (1), Review or order clinical lab tests (1) and Review of New Medication or Change in Dosage (2)  Treatment Plan Summary: Medication management and Plan    Patient took intentional overdose of Percocet.     Patient will be admitted to the psychiatry service. Restart her on Paxil for depression and anxiety. Supportive counseling.  Plan:  Discussed with patient about her medications and she agreed to continue with the Paxil at this time. Obtain collateral information from her family members  I will continue with the medications and will adjust them to target the symptoms.  Closely monitor the adverse effects, efficacy and therapeutic response of medication. Pt will attend the group and milieu therapy.  Pt will be evaluated by the treatment team on a regular basis to discuss treatment plan and discharge planning.  SW and other staff to help with disposition.               Assessment/Plan  Rainey Pines 09/15/2014, 12:48 PM

## 2014-09-15 NOTE — [facility] Group Notes (Signed)
[facility] Group Notes:  (Nursing/MHT/Case Management/Adjunct)  Date:  09/15/2014  Time:  3:00 AM  Type of Therapy:  Group Therapy  Participation Level:  Did Not Attend    Summary of Progress/Problems:  Berlin Mokry Imani Nalany Steedley 09/15/2014, 3:00 AM 

## 2014-09-15 NOTE — Progress Notes (Signed)
D: Pt denies SI/HI/AV. Pt is pleasant and cooperative. Pt goal for today is to spend time out of her room. A: Pt was offered support and encouragement. Pt was given scheduled medications. Pt was encourage to attend groups. Q 15 minute checks were done for safety.  R:Pt attends groups and interacts well with peers and staff. Pt is taking medication. Pt has no complaints.Pt receptive to treatment and safety maintained on unit.

## 2014-09-15 NOTE — Plan of Care (Signed)
Problem: Ineffective individual coping Goal: LTG: Patient will report a decrease in negative feelings Outcome: Not Progressing Patient still notes depression, worry, and feeling hopeless.

## 2014-09-15 NOTE — Plan of Care (Signed)
Problem: Ineffective individual coping Goal: STG: Patient will remain free from self harm Outcome: Progressing No self-harm.      

## 2014-09-15 NOTE — [facility] Group Notes (Signed)
[facility] Group Notes:  (Nursing/MHT/Case Management/Adjunct)  Date:  09/15/2014  Time:  10:12 AM  Type of Therapy:  Group Therapy  Participation Level:  Minimal  Participation Quality:  Appropriate, Attentive and Resistant  Affect:  Appropriate  Cognitive:  Alert, Appropriate and Oriented  Insight:  Appropriate  Engagement in Group:  Limited  Modes of Intervention:  Discussion  Summary of Progress/Problems:  Michele [name] Michele [name] 09/15/2014, 10:12 AM

## 2014-09-15 NOTE — [facility] Group Notes (Signed)
[facility] LCSW Group Therapy  09/15/2014 3:32 PM  Type of Therapy:  Group Therapy  Participation Level:  Minimal  Participation Quality:  Attentive  Affect:  Tearful  Cognitive:  Alert  Insight:  Improving  Engagement in Therapy:  Improving  Modes of Intervention:  Discussion, Education, Socialization and Support  Summary of Progress/Problems: Patients identify obstacles, self-sabotaging and enabling behaviors. Patients explore aspects of self sabotage and enabling and how to limit these self-destructive behaviors in everyday life. Massiah expressed that when bad things happen, she begins to think of all the bad things that have happened to her. She states she bottles up her emotions which has cause problems in her relationship with her husband.   Daisy Floroandace L Cortlin Marano MSW, LCSWA  09/15/2014, 3:32 PM

## 2014-09-16 DIAGNOSIS — F332 Major depressive disorder, recurrent severe without psychotic features: Secondary | ICD-10-CM | POA: Diagnosis present

## 2014-09-16 NOTE — [facility] Group Notes (Signed)
[facility] LCSW Group Therapy  09/16/2014 6:52 PM  Type of Therapy:  Group Therapy  Participation Level:  Minimal  Participation Quality:  Attentive  Affect: Depressed  Cognitive:  Alert  Insight:  Limited   Engagement in Therapy:  Limited   Modes of Intervention:  Discussion, Education, Role-play, Socialization and Support  Summary of Progress/Problems:Communications: Patients identify how individuals communicate with one another appropriately and inappropriately. Patients will be guided to discuss their thoughts, feelings, and behaviors related to barriers when communicating. The group will process together ways to execute positive and appropriate communications. Michele [name] attended group and stayed the entire time. She sat quietly and listened to other group members.   Sempra EnergyCandace L Amad Mau MSW, LCSWA  09/16/2014, 6:52 PM

## 2014-09-16 NOTE — [facility] Group Notes (Signed)
[facility] Group Notes:  (Nursing/MHT/Case Management/Adjunct)  Date:  09/16/2014  Time:  10:47 PM  Type of Therapy:  Group Therapy  Participation Level:  Minimal  Participation Quality:  Appropriate and Attentive  Affect:  Appropriate  Cognitive:  Alert and Appropriate  Insight:  Appropriate  Engagement in Group:  Engaged  Modes of Intervention:  Discussion  Summary of Progress/Problems:  Michele [name] 09/16/2014, 10:47 PM

## 2014-09-16 NOTE — Plan of Care (Signed)
Problem: Ineffective individual coping Goal: LTG: Patient will report a decrease in negative feelings Outcome: Progressing Patient states feelings of depression have decreased.  Goal: STG:Pt. will utilize relaxation techniques to reduce stress STG: Patient will utilize relaxation techniques to reduce stress levels  Outcome: Progressing Pt spent majority of evening resting in her room.

## 2014-09-16 NOTE — Plan of Care (Signed)
Problem: Ineffective individual coping Goal: LTG: Patient will report a decrease in negative feelings Outcome: Progressing Patient denies any SI, and notes she feels better about her current situation. States she feels she is able to handle going home.  Goal: STG: Patient will remain free from self harm Outcome: Progressing No self harm.  Goal: STG:Pt. will utilize relaxation techniques to reduce stress STG: Patient will utilize relaxation techniques to reduce stress levels  Outcome: Progressing Patient more interactive in the milieu, interacting with peers, and attending groups.

## 2014-09-16 NOTE — [facility] Counselor (Signed)
Adult Comprehensive Assessment  Patient ID: Michele [name], female   DOB: 11-14-1985, 29 y.o.   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  Family Relationships: Recent separation from husband  Museum/gallery curator / Lack of resources (include bankruptcy): Limited income  Housing / Lack of housing: None reported  Physical health (include injuries & life threatening diseases): None reported  Social relationships: None reported  Substance abuse: Denies use  Bereavement / Loss: None reported   Living/Environment/Situation:  Living Arrangements: Spouse/significant other Living conditions (as described by patient or guardian): Husband and 4 children  How long has patient lived in current situation?: 2 years  What is atmosphere in current home: Loving, Supportive  Family History:  Marital status: Separated Separated, when?: A week ago  What types of issues is patient dealing with in the relationship?: Pt states she bottles up everything until  she explodes.  Additional relationship information: Prior to admission, pt found out her husband was talking to another woman.  Does patient have children?: Yes How many children?: 4 How is patient's relationship with their children?: 2 daughters, 2 sons. Ages 9,7,5,3  Childhood History:  By whom was/is the patient raised?: Mother/father and step-parent Additional childhood history information: Mother abandoned her when she was an infant. Biological mother died a year ago.  Description of patient's relationship with caregiver when they were a child: Good with father and step mother  Patient's description of current relationship with people who raised him/her: Good with father and step mother  Does patient have siblings?: Yes Number of Siblings: 3 Description of patient's current relationship with siblings: 2 sisters ,  1 brother. Recently connected with brother. Good relationship with one sister but has never met the other.  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  How long has patient been employed?: few months  Patient's job has been impacted by current illness: No What is the longest time patient has a held a job?: [place]  Where was the patient employed at that time?: few months  Has patient ever been in the [employer]?: No  Financial Resources:   Museum/gallery curator resources: Income from employment, Income from spouse, Food stamps Does patient have a representative payee or guardian?: No  Alcohol/Substance Abuse:   What has been your use of drugs/alcohol within the last 12 months?: Denies use  If attempted suicide, did drugs/alcohol play a role in this?: No Alcohol/Substance Abuse Treatment Hx: Denies past history Has alcohol/substance abuse ever caused legal problems?: No  Social Support System:   Pensions consultant Support System: Good Describe Community Support System: family  Type of faith/religion: Christianity  How does patient's faith help to cope with current illness?: Comfort   Leisure/Recreation:   Leisure and Hobbies: taking care of her farm animals. drawing,painting   Strengths/Needs:   What things does the patient do well?: cook In what areas does patient struggle / problems for patient: depression, relationship  with husband    Discharge Plan:   Does patient have access to transportation?: Yes Will patient be returning to same living situation after discharge?: Yes Currently receiving community mental health services: No If no, would patient like referral for services when discharged?: Yes (What [place] ) Does patient have financial barriers related to discharge medications?: Yes Patient description of barriers related to discharge medications: Limited income, no insurance   Summary/Recommendations:   Michele [name] is a 29 year old female who presented to [facility] after attempting to overdose on multiple medications. She reports recent separation from her husband of 11 years. She and her children moved in with her parents on Sunday. Pt is employed and lives in [place]. Pt denies substance abuse. Pt does not receive outpatient services but would like a referral. Pt plans to return home and follow up with [facility]. Recommendations include; crisis stabilization, medication management, therapeutic milieu, and encourage group attendance and participation.   [place]. MSW, [facility]  09/16/2014

## 2014-09-16 NOTE — Progress Notes (Signed)
[facility]  09/16/2014 5:50 PM Michele [name]  MRN:  [id number] Subjective:    Patient is a 89 over female who was admitted due to worsening of her depression symptoms. She reported that she has long history of depression which has been getting worse for the past month or so. Patient reported that now she has started feeling better since she was started on the medications. She was complaining of having some  abdominal pain and nausea and headaches due to the Paxil but she reported that she is able to tolerate the adverse effects as the Paxil has been helping her with her depressive symptoms. She reported that she has more energy and she is feeling less depressed. She reported that she is able to communicate with her husband and is not having  suicidal ideations at this time. Patient reported that she will let the staff know if her adverse effects  get worse. She currently denied having any suicidal ideations or plans..  Principal Problem: Major depression Diagnosis:   Patient Active Problem List   Diagnosis Date Noted  . Major depression [F32.2] 09/14/2014  . Intentional opiate overdose [T40.602A] 09/14/2014  . Suicidal ideation [R45.851] 09/14/2014   Total Time spent with patient: 20 minutes   Past Medical History:  Past Medical History  Diagnosis Date  . Depression   . Anxiety     Past Surgical History  Procedure Laterality Date  . Cesarean section    . Tonsillectomy    . Knee surgery     Family History:  Family History  Problem Relation Age of Onset  . Mental illness Father   . Depression Father   . Mental illness Sister   . Schizophrenia Sister   . Depression Mother    Social History:  History  Alcohol Use No    Comment: rare     History  Drug Use No    History   Social History  . Marital Status: Legally Separated    Spouse Name: N/A  . Number of Children: N/A  . Years of Education: N/A   Social History Main Topics  . Smoking status:  Never Smoker   . Smokeless tobacco: Never Used  . Alcohol Use: No     Comment: rare  . Drug Use: No  . Sexual Activity: Yes    Birth Control/ Protection: None   Other Topics Concern  . None   Social History Narrative   Additional History:    Sleep: Fair  Appetite:  Fair   Assessment:   Musculoskeletal: Strength & Muscle Tone: within normal limits Gait & Station: normal Patient leans: N/A   Psychiatric Specialty Exam: Physical Exam  ROS  Blood pressure 117/70, pulse 89, temperature 98.6 F (37 C), temperature source Oral, resp. rate 20, height 5\' 5"  (1.651 m), weight 239 lb (108.41 kg), last menstrual period 08/01/2014, SpO2 97 %.Body mass index is 39.77 kg/(m^2).  General Appearance: Casual  Eye Contact::  Fair  Speech:  Clear and Coherent  Volume:  Decreased  Mood:  Anxious and Depressed  Affect:  Congruent  Thought Process:  Coherent  Orientation:  Full (Time, Place, and Person)  Thought Content:  WDL  Suicidal Thoughts:  No  Homicidal Thoughts:  No  Memory:  Immediate;   Fair  Judgement:  Fair  Insight:  Fair  Psychomotor Activity:  Decreased  Concentration:  Fair  Recall:  FiservFair  Fund of Knowledge:Fair  Language: Fair  Akathisia:  No  Handed:  Right  AIMS (if indicated):     Assets:  Communication Skills Desire for Improvement Physical Health  ADL's:  Intact  Cognition: WNL  Sleep:  Number of Hours: 8.75     Current Medications: Current Facility-Administered Medications  Medication Dose Route Frequency Provider Last Rate Last Dose  . acetaminophen (TYLENOL) tablet 650 mg  650 mg Oral Q6H PRN Audery Amel, MD   650 mg at 09/16/14 1610  . alum & mag hydroxide-simeth (MAALOX/MYLANTA) 200-200-20 MG/5ML suspension 30 mL  30 mL Oral Q4H PRN Audery Amel, MD      . magnesium hydroxide (MILK OF MAGNESIA) suspension 30 mL  30 mL Oral Daily PRN Audery Amel, MD      . PARoxetine (PAXIL) tablet 20 mg  20 mg Oral Daily Audery Amel, MD   20 mg at  09/16/14 1026    Lab Results: No results found for this or any previous visit (from the past 48 hour(s)).  Physical Findings: AIMS:  , ,  ,  ,    CIWA:    COWS:     Treatment Plan Summary: Daily contact with patient to assess and evaluate symptoms and progress in treatment and Medication management   Medical Decision Making:  Review of Psycho-Social Stressors (1)  Patient will continue on Paxil 20 mg for depression. Advised her to let the staff know if she notices worsening of her symptoms and she demonstrated understanding..  I will continue with the medications and will adjust them to target the symptoms.  Closely monitor the adverse effects, efficacy and therapeutic response of medication. Pt will attend the group and milieu therapy.  Pt will be evaluated by the treatment team on a regular basis to discuss treatment plan and discharge planning.  SW and other staff to help with disposition.      Michele [name] 09/16/2014, 5:50 PM

## 2014-09-16 NOTE — [facility] Group Notes (Signed)
[facility] Group Notes:  (Nursing/MHT/Case Management/Adjunct)  Date:  09/16/2014  Time:  9:07 AM  Type of Therapy:  goal setting   Participation Level:  Active  Participation Quality:  Appropriate and Supportive  Affect:  Flat  Cognitive:  Appropriate  Insight:  Appropriate  Engagement in Group:  Supportive  Modes of Intervention:  goal setting   Summary of Progress/Problems:  Michele [name] 09/16/2014, 9:07 AM

## 2014-09-16 NOTE — Progress Notes (Signed)
D) Patient pleasant and cooperative upon my assessment. Patient did not complete Self Inventory Assessment however, she denies SI/HI, denies A/V hallucinations.  Patient's affect is flat and mood is sad/depressed.  Reports that her appetite is good.   A) Patient offered support and encouragement, patient encouraged to discuss feelings/concerns with staff. Patient verbalized understanding. Patient monitored Q15 minutes for safety. Patient met with MD  to discuss today's goals and plan of care.  R) Patient visible in milieu she did attend groups today.  Patient come to dining room for meals and snacks. Patient appropriate with staff and peers.   Patient taking medications as ordered. Will continue to monitor.

## 2014-09-17 MED ORDER — PAROXETINE HCL 20 MG PO TABS: 20.0000 mg | ORAL_TABLET | Freq: Every day | ORAL | Status: DC

## 2014-09-17 NOTE — [facility] Group Notes (Signed)
[facility]  09/17/2014 10:18 AM  Participation Quality:  Attentive  Affect:  Appropriate  Cognitive:  Appropriate  Insight:  Engaged  Engagement in Group:  Engaged  Modes of Intervention:  Discussion, Education and Support  Summary of Progress/Problems: patients goals is be discharged today and to meet with SW to review her plan of care  Michele MoloneyBandi, Garreth Burnsworth M 09/17/2014, 10:18 AM

## 2014-09-17 NOTE — [facility] Group Notes (Signed)
[facility] LCSW Group Therapy  09/17/2014 2:38 PM  Type of Therapy:  Group Therapy  Participation Level:  Active  Participation Quality:  Appropriate  Affect:  Appropriate  Cognitive:  Appropriate  Insight:  Improving  Engagement in Therapy:  Engaged  Modes of Intervention:  Discussion, Education, Exploration and Support  Summary of Progress/Problems:LCSW introduced group rules. The topic of emotional self regulation was introduced. Patients were asked to reflect on negative experiences and outcomes. A group exercise of "If I was five" what would I do to be happy. Patients shared a lot of personal stories and supported each other. Patients were then asked to reflect on healthier alternatives to cope with their negative emotions. Patients thoughts and contributions today were well thought out and reflective. Patient was able to support her peers with empathy and humour  Johnella MoloneyBandi, Raife Lizer M 09/17/2014, 2:38 PM

## 2014-09-17 NOTE — [facility] Group Notes (Signed)
[facility] Group Notes:  (Nursing/MHT/Case Management/Adjunct)  Date:  09/17/2014  Time:  2:10 PM  Type of Therapy:  Psychoeducational Skills  Participation Level:  Minimal  Participation Quality:  Appropriate  Affect:  Blunted  Cognitive:  Appropriate and Oriented  Insight:  Good  Engagement in Group:  Lacking and Limited  Modes of Intervention:  Discussion, Exploration and Problem-solving  Summary of Progress/Problems:  Tzion Wedel R Delvon Chipps 09/17/2014, 2:10 PM

## 2014-09-17 NOTE — Progress Notes (Signed)
Pt discharged home. DC instructions provided and explained. Medications reviewed. Rx given. All questions answered. Pt stable at discharge. Denies SI, HI, AVH. 

## 2014-09-17 NOTE — Progress Notes (Signed)
D) Patient pleasant and cooperative upon my assessment. However, she denies SI/HI, denies A/V hallucinations. Patient's affect is brighter.  A) Patient offered support and encouragement, patient encouraged to discuss feelings/concerns with staff. Patient verbalized understanding. Patient monitored Q15 minutes for safety.   R) Patient visible in milieu she did attend groups. Patient appropriate with staff and peers.Med compliant. Will continue to monitor.

## 2014-09-17 NOTE — Discharge Summary (Signed)
Physician Discharge Summary Note  Patient:  Michele [name] is an 29 y.o., female MRN:  [id number] DOB:  03/26/1985 Patient phone:  [phone] (home)  Patient address:   [address] [place] [address],  Total Time spent with patient: 30 minutes  Date of Admission:  09/14/2014 Date of Discharge: 09/17/2014  Reason for Admission:    Patient is a 81 over female who was admitted due to worsening of her depression symptoms. She reported that she has long history of depression which has been getting worse for the past month or so. She has been married for the past 11 years and she decided to split with her husband due to relational problems. They have been separated since Sunday. Patient reported that she became progressively depressed and was feeling hopeless helpless and started having suicidal thoughts. She reported that she has 4 children ages 75, 22, 27 and 73 years old and they have the joint custody of the children. She decided to overdose on the pills as she was unhappy and does not know what to do. She has been experiencing poor sleep poor appetite for at least a month. Has been having suicidal thoughts. Worries constantly about her children who are now having to go back and forth between her and her husband. Patient states that she took Percocet and possibly other pain medicine in a suicide attempt. After she did it she began to feel sick and then she made an effort to get help. She is not currently taking any psychiatric medicine.  Past history of long-standing depression. Multiple medications have been tried in the past. She recalls Zoloft and Paxil having been somewhat helpful. She has gotten [facility] past. Denies prior hospitalizations or suicide attempts.  Social history: Patient is employed. Recently split up with her husband. Has 4 children under the age of 58 who are now going back and forth between them. Finds living with her parents very difficult.   family history: Biological mother severe anxiety and depression  Medical history: Overweight. Status post knee surgery. Mild chronic joint pain no other acute medical problems.  Substance abuse history: Rarely drinks but admits that she did yesterday. Denies use of any other drugs.  Principal Problem: Major depression Discharge Diagnoses: Patient Active Problem List   Diagnosis Date Noted  . Major depressive disorder, recurrent, severe without psychotic features [F33.2]   . Major depression [F32.2] 09/14/2014  . Intentional opiate overdose [T40.602A] 09/14/2014  . Suicidal ideation [R45.851] 09/14/2014    Musculoskeletal: Strength & Muscle Tone: within normal limits Gait & Station: normal Patient leans: N/A  Psychiatric Specialty Exam: Physical Exam  Nursing note and vitals reviewed.   Review of Systems  All other systems reviewed and are negative.   Blood pressure 120/75, pulse 91, temperature 98.5 F (36.9 C), temperature source Oral, resp. rate 20, height  (1.651 m), weight 108.41 kg (239 lb), last menstrual period 08/01/2014, SpO2 97 %.Body mass index is 39.77 kg/(m^2).  General Appearance: Casual  Eye Contact::  Good  Speech:  Clear and Coherent  Volume:  Normal  Mood:  Euthymic  Affect:  Appropriate  Thought Process:  Goal Directed  Orientation:  Full (Time, Place, and Person)  Thought Content:  WDL  Suicidal Thoughts:  No  Homicidal Thoughts:  No  Memory:  Immediate;   Fair Recent;   Fair Remote;   Fair  Judgement:  Fair  Insight:  Fair  Psychomotor Activity:  Normal  Concentration:  Fair  Recall:  [employer] of Knowledge:Fair  Language: Fair  Akathisia:  No  Handed:  Right  AIMS (if indicated):     Assets:  Communication Skills Desire for Improvement Housing Physical Health Resilience Social Support  ADL's:  Intact  Cognition: WNL  Sleep:  Number of Hours: 8.75   Have you used any form of tobacco in the last 30 days? (Cigarettes,  Smokeless Tobacco, Cigars, and/or Pipes): No  Has this patient used any form of tobacco in the last 30 days? (Cigarettes, Smokeless Tobacco, Cigars, and/or Pipes) No  Past Medical History:  Past Medical History  Diagnosis Date  . Depression   . Anxiety     Past Surgical History  Procedure Laterality Date  . Cesarean section    . Tonsillectomy    . Knee surgery     Family History:  Family History  Problem Relation Age of Onset  . Mental illness Father   . Depression Father   . Mental illness Sister   . Schizophrenia Sister   . Depression Mother    Social History:  History  Alcohol Use No    Comment: rare     History  Drug Use No    History   Social History  . Marital Status: Legally Separated    Spouse Name: N/A  . Number of Children: N/A  . Years of Education: N/A   Social History Main Topics  . Smoking status: Never Smoker   . Smokeless tobacco: Never Used  . Alcohol Use: No     Comment: rare  . Drug Use: No  . Sexual Activity: Yes    Birth Control/ Protection: None   Other Topics Concern  . None   Social History Narrative    Past Psychiatric History: Hospitalizations:  Outpatient Care:  Substance Abuse Care:  Self-Mutilation:  Suicidal Attempts:  Violent Behaviors:   Risk to Self: Is patient at risk for suicide?: No What has been your use of drugs/alcohol within the last 12 months?: Denies use  Risk to Others:   Prior Inpatient Therapy:   Prior Outpatient Therapy:    Level of Care:  OP  Hospital Course:    Ms. Michele [name] is a 29 year old female with a history of depression admitted after suicide attempt y overdose in the context of marital discord.  1. Suicidal ideation. This has resolved. She is ale to contract or saety.  2. Mood.She was started on Paxil.   3. Disposition. She was discharged to home with her parents. She will follow up with [facility].  Consults:  None  Significant Diagnostic Studies:  None  Discharge Vitals:   Blood  pressure 120/75, pulse 91, temperature 98.5 F (36.9 C), temperature source Oral, resp. rate 20, height 5\' 5"  (1.651 m), weight 108.41 kg (239 lb), last menstrual period 08/01/2014, SpO2 97 %. Body mass index is 39.77 kg/(m^2). Lab Results:   No results found for this or any previous visit (from the past 72 hour(s)).  Physical Findings: AIMS:  , ,  ,  ,    CIWA:    COWS:      See Psychiatric Specialty Exam and Suicide Risk Assessment completed by Attending Physician prior to discharge.  Discharge destination:  Home  Is patient on multiple antipsychotic therapies at discharge:  No   Has Patient had three or more failed trials of antipsychotic monotherapy by history:  No    Recommended Plan for Multiple Antipsychotic Therapies: NA  Discharge Instructions  Diet - low sodium heart healthy    Complete by:  As directed      Increase activity slowly    Complete by:  As directed             Medication List    TAKE these medications      Indication   diphenhydrAMINE 25 mg capsule  Commonly known as:  BENADRYL  Take 50 mg by mouth every 6 (six) hours as needed for itching.      hydrOXYzine 25 MG tablet  Commonly known as:  ATARAX/VISTARIL  Take 1 tablet (25 mg total) by mouth every 6 (six) hours.   Indication:  Itching with Atopic Dermatitis     PARoxetine 20 MG tablet  Commonly known as:  PAXIL  Take 1 tablet (20 mg total) by mouth daily.   Indication:  Major Depressive Disorder     predniSONE 10 MG tablet  Commonly known as:  DELTASONE  5,4,3,2,1 - take with food      triamcinolone cream 0.1 %  Commonly known as:  KENALOG  Apply 1 application topically 2 (two) times daily.            Follow-up Information    Go to [facility].   Why:  For follow-up care; patient hospital followup appointment Wednesday 09/19/14 at 8:00am; walk in appointments MWF 8am-3pm   Contact information:   [address] [place], [place] Ph [phone] Fax [phone]  [place] [phone]      Follow-up recommendations:  Activity:  as tolerated. Diet:  regular. Other:  keep follow up appointments.  Comments:     Total Discharge Time: 35 min.  Signed: Kristine LineaJolanta Pucilowska 09/17/2014, 8:05 PM

## 2014-09-17 NOTE — [facility] Suicide Risk Assessment (Signed)
[facility] INPATIENT:  Family/Significant Other Suicide Prevention Education  Suicide Prevention Education:  Education Completed; Toney RakesLarry O Daniel (husband) [phone] has been identified by the patient as the family member/significant other with whom the patient will be residing, and identified as the person(s) who will aid the patient in the event of a mental health crisis (suicidal ideations/suicide attempt).  With written consent from the patient, the family member/significant other has been provided the following suicide prevention education, prior to the and/or following the discharge of the patient.  The suicide prevention education provided includes the following:  Suicide risk factors  Suicide prevention and interventions  National Suicide Hotline telephone number  [facility] assessment telephone number  [facility] Emergency Assistance 911  [facility] and/or Residential Mobile Crisis Unit telephone number  Request made of family/significant other to:  Remove weapons (e.g., guns, rifles, knives), all items previously/currently identified as safety concern.    Remove drugs/medications (over-the-counter, prescriptions, illicit drugs), all items previously/currently identified as a safety concern.  The family member/significant other verbalizes understanding of the suicide prevention education information provided.  The family member/significant other agrees to remove the items of safety concern listed above.  Lulu RidingIngle, Giavonna Pflum T, MSW, LCSWA 09/17/2014, 3:52 PM

## 2014-09-17 NOTE — Plan of Care (Signed)
Problem: Diagnosis: Increased Risk For Suicide Attempt Goal: LTG-Patient Will Show Positive Response to Medication LTG (by discharge) : Patient will show positive response to medication and will participate in the development of the discharge plan.  Outcome: Progressing Pt states depression is better. Denies SI Goal: LTG-Patient Will Report Improved Mood and Deny Suicidal LTG (by discharge) Patient will report improved mood and deny suicidal ideation.  Outcome: Progressing Pt smiling and talking. Ready to go home and see kids

## 2014-09-17 NOTE — [facility] Suicide Risk Assessment (Signed)
[facility]   Demographic Factors:  Caucasian  Total Time spent with patient: 30 minutes  Musculoskeletal: Strength & Muscle Tone: within normal limits Gait & Station: normal Patient leans: N/A  Psychiatric Specialty Exam: Physical Exam  Nursing note and vitals reviewed.   Review of Systems  All other systems reviewed and are negative.   Blood pressure 120/75, pulse 91, temperature 98.5 F (36.9 C), temperature source Oral, resp. rate 20, height 5\' 5"  (1.651 m), weight 108.41 kg (239 lb), last menstrual period 08/01/2014, SpO2 97 %.Body mass index is 39.77 kg/(m^2).  General Appearance: Casual  Eye Contact::  Good  Speech:  Clear and Coherent409  Volume:  Normal  Mood:  Euthymic  Affect:  Appropriate  Thought Process:  Goal Directed  Orientation:  Full (Time, Place, and Person)  Thought Content:  WDL  Suicidal Thoughts:  No  Homicidal Thoughts:  No  Memory:  Immediate;   Fair Recent;   Fair Remote;   Fair  Judgement:  Fair  Insight:  Fair  Psychomotor Activity:  Normal  Concentration:  Fair  Recall:  [employer]  Fund of Knowledge:Fair  Language: Fair  Akathisia:  No  Handed:  Right  AIMS (if indicated):     Assets:  Communication Skills Desire for Improvement Housing Physical Health Resilience Social Support  Sleep:  Number of Hours: 8.75  Cognition: WNL  ADL's:  Intact   Have you used any form of tobacco in the last 30 days? (Cigarettes, Smokeless Tobacco, Cigars, and/or Pipes): No  Has this patient used any form of tobacco in the last 30 days? (Cigarettes, Smokeless Tobacco, Cigars, and/or Pipes) No  Mental Status Per Nursing Assessment::   On Admission:  Self-harm behaviors  Current Mental Status by Physician: NA  Loss Factors: Loss of significant relationship  Historical Factors: NA  Risk Reduction Factors:   Responsible for children under 29 years of age, Sense of responsibility to family, Employed, Living with another  person, especially a relative and Positive social support  Continued Clinical Symptoms:  Depression:   Severe  Cognitive Features That Contribute To Risk:  None    Suicide Risk:  Minimal: No identifiable suicidal ideation.  Patients presenting with no risk factors but with morbid ruminations; may be classified as minimal risk based on the severity of the depressive symptoms  Principal Problem: Major depression Discharge Diagnoses:  Patient Active Problem List   Diagnosis Date Noted  . Major depressive disorder, recurrent, severe without psychotic features [F33.2]   . Major depression [F32.2] 09/14/2014  . Intentional opiate overdose [T40.602A] 09/14/2014  . Suicidal ideation [R45.851] 09/14/2014    Follow-up Information    Go to [facility].   Why:  For follow-up care; patient hospital followup appointment Wednesday 09/19/14 at 8:00am; walk in appointments MWF 8am-3pm   Contact information:   [address] [place], [place] Ph [phone] Fax [phone] [place] [phone]      Plan Of Care/Follow-up recommendations:  Activity:  as tolerated Diet:  regular Other:  keep follow up appointments.  Is patient on multiple antipsychotic therapies at discharge:  No   Has Patient had three or more failed trials of antipsychotic monotherapy by history:  No  Recommended Plan for Multiple Antipsychotic Therapies: NA    Oziel Beitler 09/17/2014, 5:08 PM

## 2014-09-17 NOTE — Progress Notes (Signed)
Took over care of pt at 2300. Pt appears to be resting well thru the night.

## 2014-09-18 NOTE — Progress Notes (Signed)
AVS H&P Discharge Summary faxed to [facility] for hospital follow-up °

## 2014-09-18 NOTE — Progress Notes (Signed)
  [facility] Discharge [facility] :  Will you be returning to the same living situation after discharge:  Yes,  home with husband  At discharge, do you have transportation home?: Yes,  husband to pick up Do you have the ability to pay for your medications: Yes,  medications are $4 and patient is given a [facility] application  Release of information consent forms completed and in the chart;  Patient's signature needed at discharge.  Patient to Follow up at: Follow-up Information    Go to [facility].   Why:  For follow-up care; patient hospital followup appointment Wednesday 09/19/14 at 8:00am; walk in appointments MWF 8am-3pm   Contact information:   [address] [place], [place] Ph [phone] Fax [phone] [address] [place] [place] [phone]      Patient denies SI/HI: Yes,  patient denies SI/HI    Safety Planning and Suicide Prevention discussed: Yes,  SPE discussed with patient and her husband  Have you used any form of tobacco in the last 30 days? (Cigarettes, Smokeless Tobacco, Cigars, and/or Pipes): No  Has patient been referred to the Quitline?: N/A patient is not a smoker  Beryl MeagerIngle, Alicya Bena T, MSW, LCSWA 09/18/2014, 9:07 AM

## 2015-12-17 ENCOUNTER — Encounter (HOSPITAL_COMMUNITY): Payer: Self-pay | Admitting: Emergency Medicine

## 2015-12-17 ENCOUNTER — Emergency Department (HOSPITAL_COMMUNITY): Payer: Medicaid Other

## 2015-12-17 ENCOUNTER — Emergency Department (HOSPITAL_COMMUNITY)
Admission: EM | Admit: 2015-12-17 | Discharge: 2015-12-17 | Disposition: A | Discharge status: Home / Self Care | Payer: Medicaid Other | Attending: Emergency Medicine | Admitting: Emergency Medicine

## 2015-12-17 DIAGNOSIS — Y9301 Activity, walking, marching and hiking: Secondary | ICD-10-CM | POA: Insufficient documentation

## 2015-12-17 DIAGNOSIS — Z79899 Other long term (current) drug therapy: Secondary | ICD-10-CM | POA: Insufficient documentation

## 2015-12-17 DIAGNOSIS — Z791 Long term (current) use of non-steroidal anti-inflammatories (NSAID): Secondary | ICD-10-CM | POA: Insufficient documentation

## 2015-12-17 DIAGNOSIS — M7071 Other bursitis of hip, right hip: Secondary | ICD-10-CM

## 2015-12-17 LAB — POC URINE PREG, ED: PREG TEST UR: NEGATIVE

## 2015-12-17 MED ORDER — HYDROCODONE-ACETAMINOPHEN 5-325 MG PO TABS: ORAL_TABLET | ORAL | 0 refills | Status: DC

## 2015-12-17 MED ORDER — IBUPROFEN 800 MG PO TABS
800.0000 mg | ORAL_TABLET | Freq: Once | ORAL | Status: AC
  Administered 2015-12-17: 800 mg via ORAL
  Filled 2015-12-17: qty 1

## 2015-12-17 MED ORDER — IBUPROFEN 800 MG PO TABS: 800.0000 mg | ORAL_TABLET | Freq: Three times a day (TID) | ORAL | 0 refills | Status: DC

## 2015-12-17 NOTE — ED Triage Notes (Signed)
Pt reports worsening R hip pain that started approx 1 month ago. No known injury.

## 2015-12-17 NOTE — Discharge Instructions (Signed)
Apply ice packs on/off to your hip. Call Dr. Mort SawyersHarrison's office to arrange a follow-up appt in a few days if not improving

## 2015-12-17 NOTE — ED Notes (Signed)
Pt verbalized understanding of no driving and to use caution within 4 hours of taking pain meds due to meds cause drowsiness 

## 2015-12-21 NOTE — ED Provider Notes (Signed)
[facility] DEPT Provider Note   CSN: [id number] Arrival date & time: 12/17/15  1958     History   Chief Complaint Chief Complaint  Patient presents with  . Hip Pain    HPI Michele [name] is a 30 y.o. female.  HPI  Michele [name] is a 30 y.o. female who presents to the Emergency Department complaining of persistent of her right hip pain for one month.  She states that she walks and stands all day at her job, but denies known injury.  She describes a throbbing pain to her hip that worsens with weight bearing and improves at rest with her right leg slightly elevated.  She denies back pain, abdominal pain, fever, urinary symptoms, fever or recent illness.  Past Medical History:  Diagnosis Date  . Anxiety   . Depression     Patient Active Problem List   Diagnosis Date Noted  . Major depressive disorder, recurrent, severe without psychotic features (HCC)   . Intentional opiate overdose (HCC) 09/14/2014  . Suicidal ideation 09/14/2014    Past Surgical History:  Procedure Laterality Date  . CESAREAN SECTION    . KNEE SURGERY    . TONSILLECTOMY      OB History    Gravida Para Term Preterm AB Living   4 4 4          SAB TAB Ectopic Multiple Live Births                   Home Medications    Prior to Admission medications   Medication Sig Start Date End Date Taking? Authorizing Provider  diphenhydrAMINE (BENADRYL) 25 mg capsule Take 50 mg by mouth every 6 (six) hours as needed for itching.    Historical Provider, MD  HYDROcodone-acetaminophen (NORCO/VICODIN) 5-325 MG tablet Take one-two tabs po q 4-6 hrs prn pain 12/17/15   Ronel Rodeheaver, PA-C  HYDROcodone-acetaminophen (NORCO/VICODIN) 5-325 MG tablet Take one-two tabs po q 4-6 hrs prn pain 12/17/15   Latona Krichbaum, PA-C  hydrOXYzine (ATARAX/VISTARIL) 25 MG tablet Take 1 tablet (25 mg total) by mouth every 6 (six) hours. Patient not taking: Reported on 09/14/2014 08/01/14   Ivery Quale, PA-C    ibuprofen (ADVIL,MOTRIN) 800 MG tablet Take 1 tablet (800 mg total) by mouth 3 (three) times daily. Take with food 12/17/15   Arisha Gervais, PA-C  PARoxetine (PAXIL) 20 MG tablet Take 1 tablet (20 mg total) by mouth daily. 09/17/14   Shari Prows, MD  predniSONE (DELTASONE) 10 MG tablet 5,4,3,2,1 - take with food Patient not taking: Reported on 09/14/2014 08/01/14   Ivery Quale, PA-C  triamcinolone cream (KENALOG) 0.1 % Apply 1 application topically 2 (two) times daily. Patient not taking: Reported on 09/14/2014 08/01/14   Ivery Quale, PA-C    Family History Family History  Problem Relation Age of Onset  . Mental illness Father   . Depression Father   . Mental illness Sister   . Schizophrenia Sister   . Depression Mother     Social History Social History  Substance Use Topics  . Smoking status: Never Smoker  . Smokeless tobacco: Never Used  . Alcohol use No     Comment: rare     Allergies   Penicillins and Sulfa antibiotics   Review of Systems Review of Systems  Constitutional: Negative for chills and fever.  Gastrointestinal: Negative for abdominal pain.  Genitourinary: Negative for difficulty urinating, dysuria and flank pain.  Musculoskeletal: Positive for arthralgias (right hip  pain). Negative for joint swelling.  Skin: Negative for color change and wound.  All other systems reviewed and are negative.    Physical Exam Updated Vital Signs BP 130/85 (BP Location: Left Arm)   Pulse 92   Temp 98.9 F (37.2 C) (Oral)   Resp 20   Ht 5\' 3"  (1.6 m)   Wt 116.1 kg   LMP 12/14/2015   SpO2 100%   BMI 45.35 kg/m   Physical Exam  Constitutional: She is oriented to person, place, and time. She appears well-developed and well-nourished. No distress.  HENT:  Head: Normocephalic and atraumatic.  Neck: Normal range of motion. Neck supple.  Cardiovascular: Normal rate, regular rhythm and intact distal pulses.   Pulmonary/Chest: Effort normal and breath sounds  normal. No respiratory distress.  Abdominal: Soft. She exhibits no distension. There is no tenderness.  Musculoskeletal: She exhibits tenderness. She exhibits no edema or deformity.       Lumbar back: She exhibits tenderness and pain. She exhibits normal range of motion, no swelling, no deformity, no laceration and normal pulse.  ttp of the anterior and lateral right hip.  Pain reproduced with external rotation.  No spinal tenderness. No edema or erythema.  DP pulses are brisk and symmetrical.  Distal sensation intact.  Pt has 5/5 strength against resistance of bilateral lower extremities.     Neurological: She is alert and oriented to person, place, and time. She has normal strength. No sensory deficit. She exhibits normal muscle tone. Coordination and gait normal.  Skin: Skin is warm and dry. No rash noted.  Nursing note and vitals reviewed.    ED Treatments / Results  Labs (all labs ordered are listed, but only abnormal results are displayed) Labs Reviewed  POC URINE PREG, ED    EKG  EKG Interpretation None       Radiology Dg Hip Unilat W Or Wo Pelvis 2-3 Views Right  Result Date: 12/17/2015 CLINICAL DATA:  Worsening of right hip pain beginning 1 month ago. EXAM: DG HIP (WITH OR WITHOUT PELVIS) 2-3V RIGHT COMPARISON:  None. FINDINGS: There is no evidence of hip fracture or dislocation. There is no evidence of arthropathy or other focal bone abnormality. IMPRESSION: Normal radiographs Electronically Signed   By: Paulina Fusi M.D.   On: 12/17/2015 21:41     Procedures Procedures (including critical care time)  Medications Ordered in ED Medications  ibuprofen (ADVIL,MOTRIN) tablet 800 mg (800 mg Oral Given 12/17/15 2150)     Initial Impression / Assessment and Plan / ED Course  I have reviewed the triage vital signs and the nursing notes.  Pertinent labs & imaging results that were available during my care of the patient were reviewed by me and considered in my medical  decision making (see chart for details).  Clinical Course    Pt well appearing.  NV intact.  No concerning sx's for septic joint.  XR neg for fx.  Likely bursitis of hip.  Pt agrees to symptomatic tx and ortho referral.    Final Clinical Impressions(s) / ED Diagnoses   Final diagnoses:  Bursitis of right hip, unspecified bursa    New Prescriptions Discharge Medication List as of 12/17/2015  9:54 PM    START taking these medications   Details  !! HYDROcodone-acetaminophen (NORCO/VICODIN) 5-325 MG tablet Take one-two tabs po q 4-6 hrs prn pain, Print    !! HYDROcodone-acetaminophen (NORCO/VICODIN) 5-325 MG tablet Take one-two tabs po q 4-6 hrs prn pain, Print  ibuprofen (ADVIL,MOTRIN) 800 MG tablet Take 1 tablet (800 mg total) by mouth 3 (three) times daily. Take with food, Starting Tue 12/17/2015, Print     !! - Potential duplicate medications found. Please discuss with provider.       Pauline Ausammy Jovaun Levene, PA-C 12/21/15 1618    Donnetta HutchingBrian Cook, MD 12/24/15 1332

## 2015-12-24 MED FILL — Hydrocodone-Acetaminophen Tab 5-325 MG: ORAL | Qty: 6 | Status: AC

## 2016-01-30 ENCOUNTER — Encounter: Payer: Self-pay | Admitting: Emergency Medicine

## 2016-01-30 ENCOUNTER — Emergency Department
Admission: EM | Admit: 2016-01-30 | Discharge: 2016-01-30 | Disposition: A | Discharge status: Home / Self Care | Payer: Medicaid Other | Attending: Emergency Medicine | Admitting: Emergency Medicine

## 2016-01-30 DIAGNOSIS — R300 Dysuria: Secondary | ICD-10-CM

## 2016-01-30 DIAGNOSIS — N760 Acute vaginitis: Secondary | ICD-10-CM | POA: Insufficient documentation

## 2016-01-30 DIAGNOSIS — Z791 Long term (current) use of non-steroidal anti-inflammatories (NSAID): Secondary | ICD-10-CM | POA: Insufficient documentation

## 2016-01-30 DIAGNOSIS — B9689 Other specified bacterial agents as the cause of diseases classified elsewhere: Secondary | ICD-10-CM

## 2016-01-30 LAB — URINALYSIS COMPLETE WITH MICROSCOPIC (ARMC ONLY)
Bacteria, UA: NONE SEEN
Bilirubin Urine: NEGATIVE
Glucose, UA: NEGATIVE mg/dL
Nitrite: NEGATIVE
PH: 5 (ref 5.0–8.0)
Protein, ur: NEGATIVE mg/dL
Specific Gravity, Urine: 1.015 (ref 1.005–1.030)

## 2016-01-30 LAB — WET PREP, GENITAL
SPERM: NONE SEEN
TRICH WET PREP: NONE SEEN
YEAST WET PREP: NONE SEEN

## 2016-01-30 LAB — CHLAMYDIA/NGC RT PCR (ARMC ONLY)
CHLAMYDIA TR: NOT DETECTED
N GONORRHOEAE: NOT DETECTED

## 2016-01-30 LAB — POCT PREGNANCY, URINE: Preg Test, Ur: NEGATIVE

## 2016-01-30 MED ORDER — IBUPROFEN 800 MG PO TABS
800.0000 mg | ORAL_TABLET | Freq: Once | ORAL | Status: AC
  Administered 2016-01-30: 800 mg via ORAL

## 2016-01-30 MED ORDER — NYSTATIN-TRIAMCINOLONE 100000-0.1 UNIT/GM-% EX OINT: 1.0000 "application " | TOPICAL_OINTMENT | Freq: Two times a day (BID) | CUTANEOUS | 0 refills | Status: DC

## 2016-01-30 MED ORDER — CEFTRIAXONE SODIUM 250 MG IJ SOLR
250.0000 mg | INTRAMUSCULAR | Status: DC
  Administered 2016-01-30: 250 mg via INTRAMUSCULAR
  Filled 2016-01-30: qty 250

## 2016-01-30 MED ORDER — METRONIDAZOLE 500 MG PO TABS: 500.0000 mg | ORAL_TABLET | Freq: Two times a day (BID) | ORAL | 0 refills | Status: DC

## 2016-01-30 MED ORDER — LIDOCAINE HCL (PF) 1 % IJ SOLN
INTRAMUSCULAR | Status: AC
  Administered 2016-01-30: 0.9 mL
  Filled 2016-01-30: qty 5

## 2016-01-30 MED ORDER — AZITHROMYCIN 250 MG PO TABS
1000.0000 mg | ORAL_TABLET | Freq: Once | ORAL | Status: AC
Start: 2016-01-30 — End: 2016-01-30
  Administered 2016-01-30: 1000 mg via ORAL
  Filled 2016-01-30: qty 4

## 2016-01-30 NOTE — ED Provider Notes (Signed)
[facility] Emergency Department Provider Note        Time seen: ----------------------------------------- 4:11 PM on 01/30/2016 -----------------------------------------    I have reviewed the triage vital signs and the nursing notes.   HISTORY  Chief Complaint Pelvic Pain    HPI Michele [name] Michele [name] is a 30 y.[name]. female who presents to ER for pelvic pain and pain when urinating. Patient states she has not had any vaginal discharge, bleeding or odor. She does not think she is at risk for STDs. She denies fevers, chills, vomiting or diarrhea. Patient states she did take a plan B several days ago because she had unprotected sex.   Past Medical History:  Diagnosis Date  . Anxiety   . Depression     Patient Active Problem List   Diagnosis Date Noted  . Major depressive disorder, recurrent, severe without psychotic features (HCC)   . Intentional opiate overdose (HCC) 09/14/2014  . Suicidal ideation 09/14/2014    Past Surgical History:  Procedure Laterality Date  . CESAREAN SECTION    . KNEE SURGERY    . TONSILLECTOMY      Allergies Penicillins and Sulfa antibiotics  Social History Social History  Substance Use Topics  . Smoking status: Never Smoker  . Smokeless tobacco: Never Used  . Alcohol use No     Comment: rare    Review of Systems Constitutional: Negative for fever. Cardiovascular: Negative for chest pain. Respiratory: Negative for shortness of breath. Gastrointestinal: Negative for abdominal pain, vomiting and diarrhea. Genitourinary: Positive for dysuria, negative for vaginal complaints Neurological: Negative for headaches, focal weakness or numbness.  10-point ROS otherwise negative.  ____________________________________________   PHYSICAL EXAM:  VITAL SIGNS: ED Triage Vitals  Enc Vitals Group     BP 01/30/16 1355 (!) 148/98     Pulse Rate 01/30/16 1355 95     Resp 01/30/16 1355 18     Temp 01/30/16 1355 98.8  F (37.1 C)     Temp Source 01/30/16 1355 Oral     SpO2 01/30/16 1355 100 %     Weight 01/30/16 1355 238 lb (108 kg)     Height 01/30/16 1355 5\' 3"  (1.6 m)     Head Circumference --      Peak Flow --      Pain Score 01/30/16 1401 7     Pain Loc --      Pain Edu? --      Excl. in GC? --     Constitutional: Alert and oriented. Well appearing and in no distress. Eyes: Conjunctivae are normal. PERRL. Normal extraocular movements. ENT   Head: Normocephalic and atraumatic.   Nose: No congestion/rhinnorhea.   Mouth/Throat: Mucous membranes are moist.   Neck: No stridor. Cardiovascular: Normal rate, regular rhythm. No murmurs, rubs, or gallops. Respiratory: Normal respiratory effort without tachypnea nor retractions. Breath sounds are clear and equal bilaterally. No wheezes/rales/rhonchi. Gastrointestinal: Soft and nontender. Normal bowel sounds Musculoskeletal: Nontender with normal range of motion in all extremities. No lower extremity tenderness nor edema. Neurologic:  Normal speech and language. No gross focal neurologic deficits are appreciated.  Skin:  Skin is warm, dry and intact. No rash noted. Psychiatric: Mood and affect are normal. Speech and behavior are normal.  ____________________________________________  ED COURSE:  Pertinent labs & imaging results that were available during my care of the patient were reviewed by me and considered in my medical decision making (see chart for details). Clinical Course   Patient presents to  ER in no distress, we will assess with urinalysis and urine pregnancy.  Procedures ____________________________________________   LABS (pertinent positives/negatives)  Labs Reviewed  WET PREP, GENITAL - Abnormal; Notable for the following:       Result Value   Clue Cells Wet Prep HPF POC PRESENT (*)    WBC, Wet Prep HPF POC MANY (*)    All other components within normal limits  URINALYSIS COMPLETEWITH MICROSCOPIC ([facility] ONLY) -  Abnormal; Notable for the following:    Color, Urine YELLOW (*)    APPearance HAZY (*)    Ketones, ur TRACE (*)    Hgb urine dipstick 1+ (*)    Leukocytes, UA 2+ (*)    Squamous Epithelial / LPF 6-30 (*)    All other components within normal limits  CHLAMYDIA/NGC RT PCR ([facility] ONLY)  POC URINE PREG, ED  POCT PREGNANCY, URINE   ____________________________________________  FINAL ASSESSMENT AND PLAN  Dysuria, Vaginal discharge  Plan: Patient with labs as dictated above. Patient presented to ER for dysuria, likely STD clinically. She was given Rocephin and Zithromax, she'll be discharged with Flagyl and encouraged to have close follow-up with her doctor for recheck.   Michele [name], Michele Gunning E, MD   Note: This dictation was prepared with Dragon dictation. Any transcriptional errors that result from this process are unintentional    Michele FilbertJonathan [name] Elfida Shimada, MD 01/30/16 1745

## 2016-01-30 NOTE — ED Triage Notes (Signed)
Pt c/o pelvic pain and pain when urinating. Denies any vaginal discharge and or odor.

## 2016-06-11 ENCOUNTER — Emergency Department
Admission: EM | Admit: 2016-06-11 | Discharge: 2016-06-11 | Disposition: A | Discharge status: Home / Self Care | Payer: Self-pay | Attending: Emergency Medicine | Admitting: Emergency Medicine

## 2016-06-11 ENCOUNTER — Emergency Department: Payer: Self-pay

## 2016-06-11 ENCOUNTER — Encounter: Payer: Self-pay | Admitting: Emergency Medicine

## 2016-06-11 DIAGNOSIS — Z79899 Other long term (current) drug therapy: Secondary | ICD-10-CM | POA: Insufficient documentation

## 2016-06-11 DIAGNOSIS — M75102 Unspecified rotator cuff tear or rupture of left shoulder, not specified as traumatic: Secondary | ICD-10-CM | POA: Insufficient documentation

## 2016-06-11 MED ORDER — MELOXICAM 15 MG PO TABS: 15.0000 mg | ORAL_TABLET | Freq: Every day | ORAL | 0 refills | Status: DC

## 2016-06-11 MED ORDER — KETOROLAC TROMETHAMINE 60 MG/2ML IM SOLN
30.0000 mg | Freq: Once | INTRAMUSCULAR | Status: AC
  Administered 2016-06-11: 30 mg via INTRAMUSCULAR
  Filled 2016-06-11: qty 2

## 2016-06-11 MED ORDER — TRAMADOL HCL 50 MG PO TABS: 50.0000 mg | ORAL_TABLET | Freq: Four times a day (QID) | ORAL | 0 refills | Status: DC | PRN

## 2016-06-11 NOTE — ED Provider Notes (Signed)
[facility]-EMERGENCY DEPARTMENT Provider Note   CSN: [id number] Arrival date & time: 06/11/16  2103     History   Chief Complaint Chief Complaint  Patient presents with  . Shoulder Pain    HPI Michele [name] is a 31 y.o. female presents to the emergency department for evaluation of left shoulder pain. She denies any trauma or injury. Patient's pain isn't present for 1 day. She has been taking ibuprofen, 1 dose earlier today. No coughing, chest pain, shortness of breath. Patient's pain is increased with attempted abduction and flexion greater than 90. She performs a lot of repetitive activities at work with her upper extremities. She denies any neck pain numbness or tingling. Pain is moderate.  HPI  Past Medical History:  Diagnosis Date  . Anxiety   . Depression     Patient Active Problem List   Diagnosis Date Noted  . Major depressive disorder, recurrent, severe without psychotic features (HCC)   . Intentional opiate overdose (HCC) 09/14/2014  . Suicidal ideation 09/14/2014    Past Surgical History:  Procedure Laterality Date  . CESAREAN SECTION     times 4  . KNEE SURGERY    . TONSILLECTOMY      OB History    Gravida Para Term Preterm AB Living   SAB TAB Ectopic Multiple Live Births                   Home Medications    Prior to Admission medications   Medication Sig Start Date End Date Taking? Authorizing Provider  diphenhydrAMINE (BENADRYL) 25 mg capsule Take 50 mg by mouth every 6 (six) hours as needed for itching.    Historical Provider, MD  HYDROcodone-acetaminophen (NORCO/VICODIN) 5-325 MG tablet Take one-two tabs po q 4-6 hrs prn pain 12/17/15   Tammy Triplett, PA-C  HYDROcodone-acetaminophen (NORCO/VICODIN) 5-325 MG tablet Take one-two tabs po q 4-6 hrs prn pain 12/17/15   Tammy Triplett, PA-C  hydrOXYzine (ATARAX/VISTARIL) 25 MG tablet Take 1 tablet (25 mg total) by mouth every 6 (six) hours. Patient not taking: Reported on  09/14/2014 08/01/14   Ivery Quale, PA-C  ibuprofen (ADVIL,MOTRIN) 800 MG tablet Take 1 tablet (800 mg total) by mouth 3 (three) times daily. Take with food 12/17/15   Tammy Triplett, PA-C  meloxicam (MOBIC) 15 MG tablet Take 1 tablet (15 mg total) by mouth daily. 06/11/16   Evon Slack, PA-C  metroNIDAZOLE (FLAGYL) 500 MG tablet Take 1 tablet (500 mg total) by mouth 2 (two) times daily. 01/30/16   Emily Filbert, MD  nystatin-triamcinolone ointment [facility]) Apply 1 application topically 2 (two) times daily. 01/30/16   Emily Filbert, MD  PARoxetine (PAXIL) 20 MG tablet Take 1 tablet (20 mg total) by mouth daily. 09/17/14   Shari Prows, MD  predniSONE (DELTASONE) 10 MG tablet 5,4,3,2,1 - take with food Patient not taking: Reported on 09/14/2014 08/01/14   Ivery Quale, PA-C  traMADol (ULTRAM) 50 MG tablet Take 1 tablet (50 mg total) by mouth every 6 (six) hours as needed. 06/11/16   Evon Slack, PA-C  triamcinolone cream (KENALOG) 0.1 % Apply 1 application topically 2 (two) times daily. Patient not taking: Reported on 09/14/2014 08/01/14   Ivery Quale, PA-C    Family History Family History  Problem Relation Age of Onset  . Mental illness Father   . Depression Father   . Mental illness Sister   . Schizophrenia  Sister   . Depression Mother     Social History Social History  Substance Use Topics  . Smoking status: Never Smoker  . Smokeless tobacco: Never Used  . Alcohol use No     Comment: rare     Allergies   Penicillins and Sulfa antibiotics   Review of Systems Review of Systems  Constitutional: Negative for activity change, chills, fatigue and fever.  HENT: Negative for congestion, sinus pressure and sore throat.   Eyes: Negative for visual disturbance.  Respiratory: Negative for cough, chest tightness and shortness of breath.   Cardiovascular: Negative for chest pain and leg swelling.  Gastrointestinal: Negative for abdominal pain, diarrhea, nausea and  vomiting.  Genitourinary: Negative for dysuria.  Musculoskeletal: Positive for arthralgias. Negative for back pain, gait problem and neck pain.  Skin: Negative for rash.  Neurological: Negative for weakness, numbness and headaches.  Hematological: Negative for adenopathy.  Psychiatric/Behavioral: Negative for agitation, behavioral problems and confusion.     Physical Exam Updated Vital Signs BP (!) 128/91   Pulse (!) 101   Temp 98.2 F (36.8 C) (Oral)   Resp 18   Ht  (1.626 m)   Wt 106.6 kg   LMP 06/02/2016 (Approximate)   SpO2 99%   BMI 40.34 kg/m   Physical Exam  Constitutional: She is oriented to person, place, and time. She appears well-developed and well-nourished. No distress.  HENT:  Head: Normocephalic and atraumatic.  Mouth/Throat: Oropharynx is clear and moist.  Eyes: EOM are normal. Right eye exhibits no discharge. Left eye exhibits no discharge.  Neck: Normal range of motion. Neck supple.  Cardiovascular: Normal rate, regular rhythm and intact distal pulses.   Pulmonary/Chest: Effort normal and breath sounds normal. No respiratory distress. She has no wheezes. She exhibits no tenderness.  Abdominal: Soft. She exhibits no distension. There is no tenderness.  Musculoskeletal:  Left Upper Extremity: Examination of the left shoulder and arm showed no bony abnormality or edema.  Patient has range of motion that is limited to the left shoulder, abduction and flexion to 100, passively she can increase to 120 with pain. Normal internal and external rotation with no discomfort..  The patient has pain with flexion and abduction greater than 90.  The patient has a positive Hawkins test and a positive Impingement test.  The patient has a negative drop arm test. The patient has a negative yergasons and speeds test.  The patient is non-tender along the deltoid muscle.  There is no subacromial space tenderness with no AC joint tenderness.  The patient has no instability of  the shoulder with anterior-posterior motion.  There is a negative sulcus sign.  The rotator cuff muscle strength is 5/5 with supraspinatus, 5/5 with internal rotation, and 5/5 with external rotation.  There is no crepitus with range of motion activities.    Neurological: The patient has sensation that is intact to light touch and pinprick bilaterally.  The patient has normal grip strength.  The patient has full biceps, wrist extension, grip, and interosseous strength.  The patient has 2 + DTRs bilaterally.  Vascular: The patient has less than 2 second capillary refill.  The patient has normal ulnar and radial pulses.  The patient has normal warmth to touch.    Neurological: She is alert and oriented to person, place, and time. She has normal reflexes.  Skin: Skin is warm and dry.  Psychiatric: She has a normal mood and affect. Her behavior is normal. Thought content normal.  ED Treatments / Results  Labs (all labs ordered are listed, but only abnormal results are displayed) Labs Reviewed - No data to display  EKG  EKG Interpretation None       Radiology Dg Shoulder Left  Result Date: 06/11/2016 CLINICAL DATA:  Pain EXAM: LEFT SHOULDER - 2+ VIEW COMPARISON:  None. FINDINGS: Frontal, Y scapular, and axillary images were obtained. There is no fracture or dislocation. The joint spaces appear normal. No erosive change. Visualized left lung region clear. IMPRESSION: No fracture or dislocation.  No evident arthropathic change. Electronically Signed   By: Bretta Bang III M.D.   On: 06/11/2016 22:01    Procedures Procedures (including critical care time)  Medications Ordered in ED Medications  ketorolac (TORADOL) injection 30 mg (30 mg Intramuscular Given 06/11/16 2220)     Initial Impression / Assessment and Plan / ED Course  I have reviewed the triage vital signs and the nursing notes.  Pertinent labs & imaging results that were available during my care of the patient were  reviewed by me and considered in my medical decision making (see chart for details).     31 year old female with moderate left shoulder pain. Physical exam and pain consistent with rotator cuff syndrome. She is given a shot of Toradol IM in the emergency department. She will be discharged home with meloxicam and tramadol. She will follow-up with orthopedics. She is educated on signs and symptoms to return to the emergency department for.  Final Clinical Impressions(s) / ED Diagnoses   Final diagnoses:  Rotator cuff syndrome, left    New Prescriptions New Prescriptions   MELOXICAM (MOBIC) 15 MG TABLET    Take 1 tablet (15 mg total) by mouth daily.   TRAMADOL (ULTRAM) 50 MG TABLET    Take 1 tablet (50 mg total) by mouth every 6 (six) hours as needed.     Evon Slack, PA-C 06/11/16 2246    Phineas Semen, MD 06/11/16 [phone]

## 2016-06-11 NOTE — Discharge Instructions (Signed)
Please take anti-inflammatory medication as prescribed. Follow-up with orthopedics in 7-10 days if no improvement.

## 2016-06-11 NOTE — ED Triage Notes (Signed)
Patient with complaint of left shoulder pain that started when she woke up this morning. Patient states that the pain has become worse throughout the day. Patient denies any injury.

## 2016-08-04 ENCOUNTER — Encounter: Payer: Self-pay | Admitting: Emergency Medicine

## 2016-08-04 ENCOUNTER — Emergency Department
Admission: EM | Admit: 2016-08-04 | Discharge: 2016-08-04 | Disposition: A | Discharge status: Home / Self Care | Payer: Self-pay | Attending: Emergency Medicine | Admitting: Emergency Medicine

## 2016-08-04 DIAGNOSIS — Z79899 Other long term (current) drug therapy: Secondary | ICD-10-CM | POA: Insufficient documentation

## 2016-08-04 DIAGNOSIS — R300 Dysuria: Secondary | ICD-10-CM | POA: Insufficient documentation

## 2016-08-04 DIAGNOSIS — N390 Urinary tract infection, site not specified: Secondary | ICD-10-CM | POA: Insufficient documentation

## 2016-08-04 LAB — URINALYSIS, COMPLETE (UACMP) WITH MICROSCOPIC: Specific Gravity, Urine: 1.021 (ref 1.005–1.030)

## 2016-08-04 LAB — POCT PREGNANCY, URINE: PREG TEST UR: NEGATIVE

## 2016-08-04 MED ORDER — CEPHALEXIN 500 MG PO CAPS: 500.0000 mg | ORAL_CAPSULE | Freq: Two times a day (BID) | ORAL | 0 refills | Status: AC

## 2016-08-04 NOTE — ED Triage Notes (Signed)
C/O pelvic pain x 2-3 days. Ago.  Has been taking AZO pills and ihnitially symptoms improved.  Patient is c/o urinary urgency.

## 2016-08-04 NOTE — Discharge Instructions (Signed)
You are being treated for a bladder infection. Take the antibiotic until all pills are gone. Follow-up with [facility] or [facility] for ongoing symptoms.

## 2016-08-04 NOTE — ED Notes (Signed)
See triage note  States she felt like she has a UTI  Dysuria and freq for couple of days   Then today developed lower abd pain which is shooting into suprapubic area denies any fever ,n/v/d

## 2016-08-04 NOTE — ED Provider Notes (Signed)
[facility] Emergency Department Provider Note ____________________________________________  Time seen: 1646  I have reviewed the triage vital signs and the nursing notes.  HISTORY  Chief Complaint  Dysuria and Pelvic Pain  HPI Michele [name] is a 31 y.o. female presents to the ED with a 2-3 day complaint of pelvic pain and dysuria. Patient also describes some urinary urgency, but denies any frank hematuria. She's noticed some flank pain as well primarily on the left side. She's been taking AZO pills and initially had improvement of her symptoms. She presents now with continued dysuria and urinary urgency.  Past Medical History:  Diagnosis Date  . Anxiety   . Depression     Patient Active Problem List   Diagnosis Date Noted  . Major depressive disorder, recurrent, severe without psychotic features (HCC)   . Intentional opiate overdose (HCC) 09/14/2014  . Suicidal ideation 09/14/2014    Past Surgical History:  Procedure Laterality Date  . CESAREAN SECTION     times 4  . KNEE SURGERY    . TONSILLECTOMY      Prior to Admission medications   Medication Sig Start Date End Date Taking? Authorizing Provider  cephALEXin (KEFLEX) 500 MG capsule Take 1 capsule (500 mg total) by mouth 2 (two) times daily. 08/04/16 08/11/16  Sophiah Rolin, Charlesetta Ivory, PA-C  diphenhydrAMINE (BENADRYL) 25 mg capsule Take 50 mg by mouth every 6 (six) hours as needed for itching.    [provider]  HYDROcodone-acetaminophen (NORCO/VICODIN) 5-325 MG tablet Take one-two tabs po q 4-6 hrs prn pain 12/17/15   Triplett, Tammy, PA-C  HYDROcodone-acetaminophen (NORCO/VICODIN) 5-325 MG tablet Take one-two tabs po q 4-6 hrs prn pain 12/17/15   Triplett, Tammy, PA-C  hydrOXYzine (ATARAX/VISTARIL) 25 MG tablet Take 1 tablet (25 mg total) by mouth every 6 (six) hours. Patient not taking: Reported on 09/14/2014 08/01/14   Ivery Quale, PA-C  ibuprofen (ADVIL,MOTRIN) 800 MG tablet  Take 1 tablet (800 mg total) by mouth 3 (three) times daily. Take with food 12/17/15   Triplett, Tammy, PA-C  meloxicam (MOBIC) 15 MG tablet Take 1 tablet (15 mg total) by mouth daily. 06/11/16   Evon Slack, PA-C  metroNIDAZOLE (FLAGYL) 500 MG tablet Take 1 tablet (500 mg total) by mouth 2 (two) times daily. 01/30/16   Emily Filbert, MD  nystatin-triamcinolone ointment [facility]) Apply 1 application topically 2 (two) times daily. 01/30/16   Emily Filbert, MD  PARoxetine (PAXIL) 20 MG tablet Take 1 tablet (20 mg total) by mouth daily. 09/17/14   Pucilowska, Ellin Goodie, MD  predniSONE (DELTASONE) 10 MG tablet 5,4,3,2,1 - take with food Patient not taking: Reported on 09/14/2014 08/01/14   Ivery Quale, PA-C  traMADol (ULTRAM) 50 MG tablet Take 1 tablet (50 mg total) by mouth every 6 (six) hours as needed. 06/11/16   Evon Slack, PA-C  triamcinolone cream (KENALOG) 0.1 % Apply 1 application topically 2 (two) times daily. Patient not taking: Reported on 09/14/2014 08/01/14   Ivery Quale, PA-C    Allergies Penicillins and Sulfa antibiotics  Family History  Problem Relation Age of Onset  . Mental illness Father   . Depression Father   . Mental illness Sister   . Schizophrenia Sister   . Depression Mother     Social History Social History  Substance Use Topics  . Smoking status: Never Smoker  . Smokeless tobacco: Never Used  . Alcohol use No     Comment: rare  Review of Systems  Constitutional: Negative for fever. Cardiovascular: Negative for chest pain. Respiratory: Negative for shortness of breath. Gastrointestinal: Negative for abdominal pain, vomiting and diarrhea. Genitourinary: Positive for dysuria. ____________________________________________  PHYSICAL EXAM:  VITAL SIGNS: ED Triage Vitals  Enc Vitals Group     BP 08/04/16 1554 (!) 146/92     Pulse Rate 08/04/16 1554 99     Resp 08/04/16 1554 20     Temp 08/04/16 1554 98.5 F (36.9 C)     Temp  Source 08/04/16 1554 Oral     SpO2 08/04/16 1554 97 %     Weight 08/04/16 1555 227 lb (103 kg)     Height 08/04/16 1555 5\' 4"  (1.626 m)     Head Circumference --      Peak Flow --      Pain Score 08/04/16 1557 5     Pain Loc --      Pain Edu? --      Excl. in GC? --     Constitutional: Alert and oriented. Well appearing and in no distress. Head: Normocephalic and atraumatic. Cardiovascular: Normal rate, regular rhythm. Normal distal pulses. Respiratory: Normal respiratory effort. No wheezes/rales/rhonchi. Gastrointestinal: Soft and nontender. No distention. Positive left flank pain. Musculoskeletal: Nontender with normal range of motion in all extremities.  Neurologic:  Normal gait without ataxia. Normal speech and language. No gross focal neurologic deficits are appreciated. Skin:  Skin is warm, dry and intact. No rash noted. ____________________________________________   LABS (pertinent positives/negatives) Labs Reviewed  URINALYSIS, COMPLETE (UACMP) WITH MICROSCOPIC - Abnormal; Notable for the following:       Result Value   Color, Urine ORANGE (*)    APPearance CLOUDY (*)    Glucose, UA   (*)    Value: TEST NOT REPORTED DUE TO COLOR INTERFERENCE OF URINE PIGMENT   Hgb urine dipstick   (*)    Value: TEST NOT REPORTED DUE TO COLOR INTERFERENCE OF URINE PIGMENT   Bilirubin Urine   (*)    Value: TEST NOT REPORTED DUE TO COLOR INTERFERENCE OF URINE PIGMENT   Ketones, ur   (*)    Value: TEST NOT REPORTED DUE TO COLOR INTERFERENCE OF URINE PIGMENT   Protein, ur   (*)    Value: TEST NOT REPORTED DUE TO COLOR INTERFERENCE OF URINE PIGMENT   Nitrite   (*)    Value: TEST NOT REPORTED DUE TO COLOR INTERFERENCE OF URINE PIGMENT   Leukocytes, UA   (*)    Value: TEST NOT REPORTED DUE TO COLOR INTERFERENCE OF URINE PIGMENT   Bacteria, UA RARE (*)    Squamous Epithelial / LPF 6-30 (*)    Non Squamous Epithelial 0-5 (*)    All other components within normal limits  URINE CULTURE  POC  URINE PREG, ED  POCT PREGNANCY, URINE  ____________________________________________  INITIAL IMPRESSION / ASSESSMENT AND PLAN / ED COURSE  Patient with clinical presentation consistent with an acute cystitis. Her urinalysis is consistent with a leukocyturia. She'll be discharged with a prescription for Keflex to dose as directed. Her urine culture is pending at the time of discharge. A work note is provided for today as requested. Return precautions are reviewed. ____________________________________________  FINAL CLINICAL IMPRESSION(S) / ED DIAGNOSES  Final diagnoses:  Lower urinary tract infectious disease      Lissa HoardMenshew, Emerie Vanderkolk V Bacon, PA-C 08/04/16 1748    Loleta RoseForbach, Cory, MD 08/04/16 [phone]

## 2016-08-07 LAB — URINE CULTURE: Special Requests: NORMAL

## 2016-08-13 ENCOUNTER — Emergency Department: Payer: Self-pay

## 2016-08-13 ENCOUNTER — Encounter: Payer: Self-pay | Admitting: Emergency Medicine

## 2016-08-13 ENCOUNTER — Telehealth: Payer: Self-pay | Admitting: Emergency Medicine

## 2016-08-13 ENCOUNTER — Emergency Department
Admission: EM | Admit: 2016-08-13 | Discharge: 2016-08-13 | Disposition: A | Discharge status: Home / Self Care | Payer: Self-pay | Attending: Emergency Medicine | Admitting: Emergency Medicine

## 2016-08-13 DIAGNOSIS — Z5321 Procedure and treatment not carried out due to patient leaving prior to being seen by health care provider: Secondary | ICD-10-CM | POA: Insufficient documentation

## 2016-08-13 DIAGNOSIS — R079 Chest pain, unspecified: Secondary | ICD-10-CM | POA: Insufficient documentation

## 2016-08-13 LAB — CBC
HEMATOCRIT: 31.4 % — AB (ref 35.0–47.0)
Hemoglobin: 10.6 g/dL — ABNORMAL LOW (ref 12.0–16.0)
MCH: 26.6 pg (ref 26.0–34.0)
MCHC: 33.8 g/dL (ref 32.0–36.0)
MCV: 78.7 fL — AB (ref 80.0–100.0)
PLATELETS: 297 10*3/uL (ref 150–440)
RBC: 3.99 MIL/uL (ref 3.80–5.20)
RDW: 14.3 % (ref 11.5–14.5)
WBC: 7.5 10*3/uL (ref 3.6–11.0)

## 2016-08-13 LAB — BASIC METABOLIC PANEL
Anion gap: 6 (ref 5–15)
BUN: 13 mg/dL (ref 6–20)
CHLORIDE: 106 mmol/L (ref 101–111)
CO2: 25 mmol/L (ref 22–32)
Calcium: 9.2 mg/dL (ref 8.9–10.3)
Creatinine, Ser: 0.74 mg/dL (ref 0.44–1.00)
Glucose, Bld: 97 mg/dL (ref 65–99)
POTASSIUM: 4.1 mmol/L (ref 3.5–5.1)
SODIUM: 137 mmol/L (ref 135–145)

## 2016-08-13 LAB — TROPONIN I: Troponin I: <0.03 ng/mL (ref <0.03)

## 2016-08-13 NOTE — ED Notes (Signed)
ekg was done at 1037

## 2016-08-13 NOTE — Telephone Encounter (Signed)
Called patient due to lwot to inquire about condition and follow up plans. Left message.   

## 2016-08-13 NOTE — ED Triage Notes (Signed)
Pt c/o chest tightness since last night. Went away and came back. Respirations unlabored. Skin warm and dry. NAD. VSS. Also c/o mild headache in triage.

## 2017-03-23 ENCOUNTER — Other Ambulatory Visit: Payer: Self-pay

## 2017-03-23 ENCOUNTER — Emergency Department
Admission: EM | Admit: 2017-03-23 | Discharge: 2017-03-23 | Disposition: A | Discharge status: Home / Self Care | Payer: Medicaid Other | Attending: Emergency Medicine | Admitting: Emergency Medicine

## 2017-03-23 ENCOUNTER — Emergency Department: Payer: Medicaid Other

## 2017-03-23 ENCOUNTER — Encounter: Payer: Self-pay | Admitting: Emergency Medicine

## 2017-03-23 DIAGNOSIS — Z3490 Encounter for supervision of normal pregnancy, unspecified, unspecified trimester: Secondary | ICD-10-CM

## 2017-03-23 DIAGNOSIS — N760 Acute vaginitis: Secondary | ICD-10-CM | POA: Insufficient documentation

## 2017-03-23 DIAGNOSIS — R102 Pelvic and perineal pain: Secondary | ICD-10-CM | POA: Diagnosis present

## 2017-03-23 DIAGNOSIS — B9689 Other specified bacterial agents as the cause of diseases classified elsewhere: Secondary | ICD-10-CM | POA: Insufficient documentation

## 2017-03-23 DIAGNOSIS — Z79899 Other long term (current) drug therapy: Secondary | ICD-10-CM | POA: Insufficient documentation

## 2017-03-23 LAB — COMPREHENSIVE METABOLIC PANEL
ALT: 15 U/L (ref 14–54)
AST: 23 U/L (ref 15–41)
Albumin: 4.3 g/dL (ref 3.5–5.0)
Alkaline Phosphatase: 91 U/L (ref 38–126)
Anion gap: 9 (ref 5–15)
BUN: 11 mg/dL (ref 6–20)
CHLORIDE: 104 mmol/L (ref 101–111)
CO2: 25 mmol/L (ref 22–32)
CREATININE: 0.73 mg/dL (ref 0.44–1.00)
Calcium: 9.3 mg/dL (ref 8.9–10.3)
Glucose, Bld: 87 mg/dL (ref 65–99)
POTASSIUM: 4 mmol/L (ref 3.5–5.1)
Sodium: 138 mmol/L (ref 135–145)
Total Bilirubin: 0.5 mg/dL (ref 0.3–1.2)
Total Protein: 7.6 g/dL (ref 6.5–8.1)

## 2017-03-23 LAB — CHLAMYDIA/NGC RT PCR (ARMC ONLY)
Chlamydia Tr: NOT DETECTED
N GONORRHOEAE: NOT DETECTED

## 2017-03-23 LAB — WET PREP, GENITAL
Sperm: NONE SEEN
Trich, Wet Prep: NONE SEEN
YEAST WET PREP: NONE SEEN

## 2017-03-23 LAB — URINALYSIS, COMPLETE (UACMP) WITH MICROSCOPIC
BILIRUBIN URINE: NEGATIVE
Glucose, UA: NEGATIVE mg/dL
KETONES UR: NEGATIVE mg/dL
LEUKOCYTES UA: NEGATIVE
Nitrite: NEGATIVE
PH: 5 (ref 5.0–8.0)
Protein, ur: NEGATIVE mg/dL
Specific Gravity, Urine: 1.029 (ref 1.005–1.030)

## 2017-03-23 LAB — LIPASE, BLOOD: LIPASE: 38 U/L (ref 11–51)

## 2017-03-23 LAB — CBC
HEMATOCRIT: 32.3 % — AB (ref 35.0–47.0)
Hemoglobin: 10.8 g/dL — ABNORMAL LOW (ref 12.0–16.0)
MCH: 27.3 pg (ref 26.0–34.0)
MCHC: 33.5 g/dL (ref 32.0–36.0)
MCV: 81.3 fL (ref 80.0–100.0)
PLATELETS: 305 10*3/uL (ref 150–440)
RBC: 3.98 MIL/uL (ref 3.80–5.20)
RDW: 14 % (ref 11.5–14.5)
WBC: 8 10*3/uL (ref 3.6–11.0)

## 2017-03-23 LAB — HCG, QUANTITATIVE, PREGNANCY: hCG, Beta Chain, Quant, S: 4265 m[IU]/mL — ABNORMAL HIGH (ref <5)

## 2017-03-23 MED ORDER — ACETAMINOPHEN 500 MG PO TABS
1000.0000 mg | ORAL_TABLET | Freq: Once | ORAL | Status: AC
  Administered 2017-03-23: 1000 mg via ORAL
  Filled 2017-03-23: qty 2

## 2017-03-23 NOTE — Discharge Instructions (Signed)
You are evaluated for abdominal cramping in early pregnancy, and your ultrasound shows a sac consistent with 5 weeks, 2 days.  I am recommending today repeat beta hCG blood hormone testing given the abnormal period in December.  This may be drawn in the ER, or at the OB/GYN office.  You are referred to Dr. Oretha Milchherry's office.  We discussed your urine sample was borderline, but I suspect contamination rather than infection.  We are going to wait for the culture rather than treat upfront without obvious symptoms of urinary tract infection.  You are vaginal swabs showed bacterial overgrowth called bacterial vaginosis.  Discussed with OB/GYN whether or not to treat now or wait until second trimester.  Return to the emergency room immediately for any worsening symptoms including vaginal bleeding, dizziness passing out, uncontrolled abdominal pain, or any other symptoms concerning to you.

## 2017-03-23 NOTE — ED Triage Notes (Signed)
First nurse note:  Pt states found out she was pregnant [redacted] weeks ago, reports abd cramping. Denies vaginal bleeding.

## 2017-03-23 NOTE — ED Provider Notes (Signed)
[facility] Emergency Department Provider Note ____________________________________________   I have reviewed the triage vital signs and the triage nursing note.  HISTORY  Chief Complaint Abdominal Pain   Historian Patient  HPI Michele [name] is a 32 y.o. female G5P4 last menstrual period December 17, although it was 4 days instead of 7 days, states that she found positive pregnancy test 2 weeks ago, and today started experiencing low back pain and pelvic pressure.  Not exactly dysuria, but pressure to urinate.  She had some mild vaginal discharge.  No vaginal bleeding.  Mild lower abdominal cramping.  Symptoms are mild, nothing makes it worse or better.   Past Medical History:  Diagnosis Date  . Anxiety   . Depression     Patient Active Problem List   Diagnosis Date Noted  . Major depressive disorder, recurrent, severe without psychotic features (HCC)   . Intentional opiate overdose (HCC) 09/14/2014  . Suicidal ideation 09/14/2014    Past Surgical History:  Procedure Laterality Date  . CESAREAN SECTION     times 4  . KNEE SURGERY    . TONSILLECTOMY      Prior to Admission medications   Medication Sig Start Date End Date Taking? Authorizing Provider  diphenhydrAMINE (BENADRYL) 25 mg capsule Take 50 mg by mouth every 6 (six) hours as needed for itching.    [provider]  HYDROcodone-acetaminophen (NORCO/VICODIN) 5-325 MG tablet Take one-two tabs po q 4-6 hrs prn pain 12/17/15   Triplett, Tammy, PA-C  HYDROcodone-acetaminophen (NORCO/VICODIN) 5-325 MG tablet Take one-two tabs po q 4-6 hrs prn pain 12/17/15   Triplett, Tammy, PA-C  hydrOXYzine (ATARAX/VISTARIL) 25 MG tablet Take 1 tablet (25 mg total) by mouth every 6 (six) hours. Patient not taking: Reported on 09/14/2014 08/01/14   Ivery Quale, PA-C  ibuprofen (ADVIL,MOTRIN) 800 MG tablet Take 1 tablet (800 mg total) by mouth 3 (three) times daily. Take with food 12/17/15    Triplett, Tammy, PA-C  meloxicam (MOBIC) 15 MG tablet Take 1 tablet (15 mg total) by mouth daily. 06/11/16   Evon Slack, PA-C  metroNIDAZOLE (FLAGYL) 500 MG tablet Take 1 tablet (500 mg total) by mouth 2 (two) times daily. 01/30/16   Emily Filbert, MD  nystatin-triamcinolone ointment [facility]) Apply 1 application topically 2 (two) times daily. 01/30/16   Emily Filbert, MD  PARoxetine (PAXIL) 20 MG tablet Take 1 tablet (20 mg total) by mouth daily. 09/17/14   Pucilowska, Ellin Goodie, MD  predniSONE (DELTASONE) 10 MG tablet 5,4,3,2,1 - take with food Patient not taking: Reported on 09/14/2014 08/01/14   Ivery Quale, PA-C  traMADol (ULTRAM) 50 MG tablet Take 1 tablet (50 mg total) by mouth every 6 (six) hours as needed. 06/11/16   Evon Slack, PA-C  triamcinolone cream (KENALOG) 0.1 % Apply 1 application topically 2 (two) times daily. Patient not taking: Reported on 09/14/2014 08/01/14   Ivery Quale, PA-C    Allergies  Allergen Reactions  . Penicillins Hives  . Sulfa Antibiotics Hives    Family History  Problem Relation Age of Onset  . Mental illness Father   . Depression Father   . Mental illness Sister   . Schizophrenia Sister   . Depression Mother     Social History Social History   Tobacco Use  . Smoking status: Never Smoker  . Smokeless tobacco: Never Used  Substance Use Topics  . Alcohol use: No    Comment: rare  . Drug use: No  Review of Systems  Constitutional: Negative for fever. Eyes: Negative for visual changes. ENT: Negative for sore throat. Cardiovascular: Negative for chest pain. Respiratory: Negative for shortness of breath. Gastrointestinal: Negative for vomiting and diarrhea. Genitourinary: Negative for dysuria, but pressure as per HPI. Musculoskeletal: Positive for mild low back pain as per HPI.   Skin: Negative for rash. Neurological: Negative for headache.  ____________________________________________   PHYSICAL  EXAM:  VITAL SIGNS: ED Triage Vitals  Enc Vitals Group     BP 03/23/17 1147 134/85     Pulse Rate 03/23/17 1147 (!) 103     Resp 03/23/17 1147 18     Temp 03/23/17 1147 99.1 F (37.3 C)     Temp Source 03/23/17 1147 Oral     SpO2 03/23/17 1147 100 %     Weight 03/23/17 1148 220 lb (99.8 kg)     Height 03/23/17 1148 5\' 3"  (1.6 m)     Head Circumference --      Peak Flow --      Pain Score 03/23/17 1147 8     Pain Loc --      Pain Edu? --      Excl. in GC? --      Constitutional: Alert and oriented. Well appearing and in no distress. HEENT   Head: Normocephalic and atraumatic.      Eyes: Conjunctivae are normal. Pupils equal and round.       Ears:         Nose: No congestion/rhinnorhea.   Mouth/Throat: Mucous membranes are moist.   Neck: No stridor. Cardiovascular/Chest: Normal rate, regular rhythm.  No murmurs, rubs, or gallops. Respiratory: Normal respiratory effort without tachypnea nor retractions. Breath sounds are clear and equal bilaterally. No wheezes/rales/rhonchi. Gastrointestinal: Soft. No distention, no guarding, no rebound.  Obese, mild discomfort in the lower abdomen. Genitourinary/rectal: Vaginal bleeding.  No cervicitis.  Mild to moderate amount of white discharge. Musculoskeletal: Nontender with normal range of motion in all extremities. No joint effusions.  No lower extremity tenderness.  No edema. Neurologic:  Normal speech and language. No gross or focal neurologic deficits are appreciated. Skin:  Skin is warm, dry and intact. No rash noted. Psychiatric: Mood and affect are normal. Speech and behavior are normal. Patient exhibits appropriate insight and judgment.   ____________________________________________  LABS (pertinent positives/negatives) I, Governor Rooksebecca Dorn Hartshorne, MD the attending physician have reviewed the labs noted below.  Labs Reviewed  WET PREP, GENITAL - Abnormal; Notable for the following components:      Result Value   Clue Cells Wet  Prep HPF POC PRESENT (*)    WBC, Wet Prep HPF POC FEW (*)    All other components within normal limits  CBC - Abnormal; Notable for the following components:   Hemoglobin 10.8 (*)    HCT 32.3 (*)    All other components within normal limits  URINALYSIS, COMPLETE (UACMP) WITH MICROSCOPIC - Abnormal; Notable for the following components:   Color, Urine YELLOW (*)    APPearance CLOUDY (*)    Hgb urine dipstick SMALL (*)    Bacteria, UA RARE (*)    Squamous Epithelial / LPF 6-30 (*)    All other components within normal limits  HCG, QUANTITATIVE, PREGNANCY - Abnormal; Notable for the following components:   hCG, Beta Chain, Quant, S 4,265 (*)    All other components within normal limits  CHLAMYDIA/NGC RT PCR ([facility] ONLY)  URINE CULTURE  LIPASE, BLOOD  COMPREHENSIVE METABOLIC PANEL  ____________________________________________  RADIOLOGY All Xrays were viewed by me.  Imaging interpreted by Radiologist, and I, Governor Rooks, MD the attending physician have reviewed the radiologist interpretation noted below.  Ultrasound pelvic and transvaginal less than 14 weeks OB:  IMPRESSION: Intrauterine gestational sac without yolk sac or embryo. Mean sac diameter compatible with estimated gestational age [redacted] weeks 2 days. Recommend serial follow-up quantitative HCG and follow-up ultrasound in 2 weeks. __________________________________________  PROCEDURES  Procedure(s) performed: None  Critical Care performed: None   ____________________________________________  ED COURSE / ASSESSMENT AND PLAN  Pertinent labs & imaging results that were available during my care of the patient were reviewed by me and considered in my medical decision making (see chart for details).   Patient here for evaluation of low back and low suprapubic pain, history of positive pregnancy test at home.  Some vaginal discharge without any vaginal bleeding.  Laboratory studies are overall  reassuring, beta-hCG about 4000.  Ultrasound ordered.  Pelvic exam  Does not really feel like she is having significant dysuria or symptoms consistent with urinary tract infection.  Her urinalysis shows some likely contamination with squamous cells, rare bacteria, 0-5 white blood cells 0-5 red blood cells, however no leukocytes or nitrites.  Because of her drug allergies including penicillins, I would consider treating with Macrobid, however she is in her first trimester, up-to-date recommends holding off if possible.  I think based off of this, it is better to wait and see if the culture actually grows a urinary tract infection, because I am not convinced that she is actually having a urinary tract infection.  I'd like to reduce the risk of antibiotic with possible side effect in 1st trimester if at all possible.  Wet prep shows consistent with bacterial vaginosis.  Given first trimester pregnancy, and questionable whether or not to treat, patient is not significantly symptomatic pump, I am going to have her follow-up with OB/GYN to discuss whether to treat or wait till second trimester.  I think the gestational sac measuring 5 weeks seems the most consistent with her last menstrual period, however given that that was an unusual period for her, and out of concern for possible abnormal progressing pregnancy, I am going to recommend 48-hour recheck beta hCG.  I have referred her to OB/GYN.   CONSULTATIONS: None  Patient / Family / Caregiver informed of clinical course, medical decision-making process, and agree with plan.   I discussed return precautions, follow-up instructions, and discharge instructions with patient and/or family.  Discharge Instructions : You are evaluated for abdominal cramping in early pregnancy, and your ultrasound shows a sac consistent with 5 weeks, 2 days.  I am recommending today repeat beta hCG blood hormone testing given the abnormal period in December.  This may be  drawn in the ER, or at the OB/GYN office.  You are referred to Dr. Oretha Milch office.  We discussed your urine sample was borderline, but I suspect contamination rather than infection.  We are going to wait for the culture rather than treat upfront without obvious symptoms of urinary tract infection.  You are vaginal swabs showed bacterial overgrowth called bacterial vaginosis.  Discussed with OB/GYN whether or not to treat now or wait until second trimester.  Return to the emergency room immediately for any worsening symptoms including vaginal bleeding, dizziness passing out, uncontrolled abdominal pain, or any other symptoms concerning to you.    ___________________________________________   FINAL CLINICAL IMPRESSION(S) / ED DIAGNOSES   Final diagnoses:  BV (bacterial  vaginosis)  Early stage of pregnancy      ___________________________________________        Note: This dictation was prepared with Dragon dictation. Any transcriptional errors that result from this process are unintentional    Governor Rooks, MD 03/23/17 1550

## 2017-03-23 NOTE — ED Triage Notes (Signed)
Pt presents with abdominal cramping. She found out she was pregnant [redacted] weeks ago and has not been to her first appointment yet. Pt states she has had discharge but no bleeding. Pt alert & oriented with NAD Noted.

## 2017-03-23 NOTE — ED Notes (Signed)
Pt discharged to home.  Family member driving.  Discharge instructions reviewed.  Verbalized understanding.  No questions or concerns at this time.  Teach back verified.  Pt in NAD.  No items left in ED.   

## 2017-03-24 LAB — URINE CULTURE

## 2017-03-29 ENCOUNTER — Other Ambulatory Visit: Payer: Self-pay

## 2017-03-29 ENCOUNTER — Emergency Department: Payer: Medicaid Other

## 2017-03-29 ENCOUNTER — Emergency Department
Admission: EM | Admit: 2017-03-29 | Discharge: 2017-03-29 | Disposition: A | Discharge status: Home / Self Care | Payer: Medicaid Other | Attending: Emergency Medicine | Admitting: Emergency Medicine

## 2017-03-29 ENCOUNTER — Encounter: Payer: Self-pay | Admitting: Emergency Medicine

## 2017-03-29 DIAGNOSIS — Z3A01 Less than 8 weeks gestation of pregnancy: Secondary | ICD-10-CM | POA: Insufficient documentation

## 2017-03-29 DIAGNOSIS — O4690 Antepartum hemorrhage, unspecified, unspecified trimester: Secondary | ICD-10-CM

## 2017-03-29 DIAGNOSIS — Z79899 Other long term (current) drug therapy: Secondary | ICD-10-CM | POA: Insufficient documentation

## 2017-03-29 DIAGNOSIS — O209 Hemorrhage in early pregnancy, unspecified: Secondary | ICD-10-CM | POA: Insufficient documentation

## 2017-03-29 LAB — CBC
HCT: 30.4 % — ABNORMAL LOW (ref 35.0–47.0)
Hemoglobin: 10.4 g/dL — ABNORMAL LOW (ref 12.0–16.0)
MCH: 27.8 pg (ref 26.0–34.0)
MCHC: 34.1 g/dL (ref 32.0–36.0)
MCV: 81.7 fL (ref 80.0–100.0)
PLATELETS: 303 10*3/uL (ref 150–440)
RBC: 3.72 MIL/uL — AB (ref 3.80–5.20)
RDW: 13.8 % (ref 11.5–14.5)
WBC: 7.9 10*3/uL (ref 3.6–11.0)

## 2017-03-29 LAB — ABO/RH: ABO/RH(D): O POS

## 2017-03-29 LAB — HCG, QUANTITATIVE, PREGNANCY: HCG, BETA CHAIN, QUANT, S: 20670 m[IU]/mL — AB (ref <5)

## 2017-03-29 NOTE — ED Triage Notes (Signed)
Was seen her for abdominal pain a week ago.  Since seen has had on and off spotting and then a gush of blood 2 nights ago.  Some pelvic pressure at times but no pain per pt.  Ambulatory. VSS

## 2017-03-29 NOTE — Discharge Instructions (Signed)
Today, your Beta HCG is 20,670. Schedule an appointment with gynecology as soon as possible. Continue taking your prenatal vitamins. If your bleeding increases or you begin to have abdominal pain or cramping, call your gynecologist or come to the ER.

## 2017-03-29 NOTE — ED Notes (Signed)
Pt verbalizes understanding of discharge instructions.

## 2017-03-29 NOTE — ED Notes (Signed)
See triage note  Presents with some vaginal spotting and pressure   Pos preg.

## 2017-03-29 NOTE — ED Provider Notes (Signed)
[facility] Emergency Department Provider Note  ____________________________________________  Time seen: Approximately 8:55 PM  I have reviewed the triage vital signs and the nursing notes.   HISTORY  Chief Complaint Vaginal Bleeding    HPI Michele [name] is a 32 y.o. female who presents to the emergency department for evaluation and treatment of vaginal bleeding.  She was seen here for abdominal pain approximately 1 week ago.  She has had intermittent vaginal spotting since that time, but had a "gush" of blood 2 nights ago with some cramping.  She has some pelvic pressure at this time but no pain.  She states that now she is having dark brown discharge/spotting.  She is gravida 5 para 4.  Past Medical History:  Diagnosis Date  . Anxiety   . Depression     Patient Active Problem List   Diagnosis Date Noted  . Major depressive disorder, recurrent, severe without psychotic features (HCC)   . Intentional opiate overdose (HCC) 09/14/2014  . Suicidal ideation 09/14/2014    Past Surgical History:  Procedure Laterality Date  . CESAREAN SECTION     times 4  . KNEE SURGERY    . TONSILLECTOMY      Prior to Admission medications   Medication Sig Start Date End Date Taking? Authorizing Provider  diphenhydrAMINE (BENADRYL) 25 mg capsule Take 50 mg by mouth every 6 (six) hours as needed for itching.    [provider]  HYDROcodone-acetaminophen (NORCO/VICODIN) 5-325 MG tablet Take one-two tabs po q 4-6 hrs prn pain 12/17/15   Kealan Buchan, Tammy, PA-C  HYDROcodone-acetaminophen (NORCO/VICODIN) 5-325 MG tablet Take one-two tabs po q 4-6 hrs prn pain 12/17/15   Jovann Luse, Tammy, PA-C  hydrOXYzine (ATARAX/VISTARIL) 25 MG tablet Take 1 tablet (25 mg total) by mouth every 6 (six) hours. Patient not taking: Reported on 09/14/2014 08/01/14   Ivery QualeBryant, Hobson, PA-C  ibuprofen (ADVIL,MOTRIN) 800 MG tablet Take 1 tablet (800 mg total) by mouth 3 (three) times  daily. Take with food 12/17/15   Kanani Mowbray, Tammy, PA-C  meloxicam (MOBIC) 15 MG tablet Take 1 tablet (15 mg total) by mouth daily. 06/11/16   Evon SlackGaines, Thomas C, PA-C  metroNIDAZOLE (FLAGYL) 500 MG tablet Take 1 tablet (500 mg total) by mouth 2 (two) times daily. 01/30/16   Emily FilbertWilliams, Jonathan E, MD  nystatin-triamcinolone ointment [facility](MYCOL[facility]) Apply 1 application topically 2 (two) times daily. 01/30/16   Emily FilbertWilliams, Jonathan E, MD  PARoxetine (PAXIL) 20 MG tablet Take 1 tablet (20 mg total) by mouth daily. 09/17/14   Pucilowska, Ellin GoodieJolanta B, MD  predniSONE (DELTASONE) 10 MG tablet 5,4,3,2,1 - take with food Patient not taking: Reported on 09/14/2014 08/01/14   Ivery QualeBryant, Hobson, PA-C  traMADol (ULTRAM) 50 MG tablet Take 1 tablet (50 mg total) by mouth every 6 (six) hours as needed. 06/11/16   Evon SlackGaines, Thomas C, PA-C  triamcinolone cream (KENALOG) 0.1 % Apply 1 application topically 2 (two) times daily. Patient not taking: Reported on 09/14/2014 08/01/14   Ivery QualeBryant, Hobson, PA-C    Allergies Penicillins and Sulfa antibiotics  Family History  Problem Relation Age of Onset  . Mental illness Father   . Depression Father   . Mental illness Sister   . Schizophrenia Sister   . Depression Mother     Social History Social History   Tobacco Use  . Smoking status: Never Smoker  . Smokeless tobacco: Never Used  Substance Use Topics  . Alcohol use: No    Comment: rare  . Drug use:  No    Review of Systems Constitutional: Negative for fever. Respiratory: Negative for shortness of breath or cough. Gastrointestinal: Negative for abdominal pain; negative negative for nausea , negative for vomiting. Genitourinary: Negative for dysuria , positive for vaginal bleeding.  Musculoskeletal: Negative for back pain. Skin: Negative for rash, lesion, or wound.. ____________________________________________   PHYSICAL EXAM:  VITAL SIGNS: ED Triage Vitals  Enc Vitals Group     BP 03/29/17 2043 118/74     Pulse Rate  03/29/17 2043 97     Resp 03/29/17 2043 18     Temp 03/29/17 2043 98.6 F (37 C)     Temp Source 03/29/17 2043 Oral     SpO2 03/29/17 2043 100 %     Weight 03/29/17 1721 220 lb (99.8 kg)     Height 03/29/17 1721 5\' 3"  (1.6 m)     Head Circumference --      Peak Flow --      Pain Score --      Pain Loc --      Pain Edu? --      Excl. in GC? --     Constitutional: Alert and oriented. Well appearing and in no acute distress. Eyes: Conjunctivae are normal. PERRL. EOMI. Head: Atraumatic. Nose: No congestion/rhinnorhea. Mouth/Throat: Mucous membranes are moist. Respiratory: Normal respiratory effort.  No retractions. Gastrointestinal: Abdomen is soft, nontender, no rebound or guarding. Genitourinary: Pelvic exam: Exam deferred Musculoskeletal: No extremity tenderness nor edema.  Neurologic:  Normal speech and language. No gross focal neurologic deficits are appreciated. Speech is normal. No gait instability. Skin:  Skin is warm, dry and intact. No rash noted. Psychiatric: Mood and affect are normal. Speech and behavior are normal.  ____________________________________________   LABS (all labs ordered are listed, but only abnormal results are displayed)  Labs Reviewed  HCG, QUANTITATIVE, PREGNANCY - Abnormal; Notable for the following components:      Result Value   hCG, Beta Chain, Quant, S 20,670 (*)    All other components within normal limits  CBC - Abnormal; Notable for the following components:   RBC 3.72 (*)    Hemoglobin 10.4 (*)    HCT 30.4 (*)    All other components within normal limits  ABO/RH   ____________________________________________  RADIOLOGY  Ultrasound indicates single intrauterine pregnancy.  6 weeks 1 day.  Fetal pole is not identified at this time.  There is no evidence of subchorionic hemorrhage, free fluid, or adnexal mass per radiology. ____________________________________________   PROCEDURES  Procedure(s) performed:  None  ____________________________________________  32 year old female presenting to the emergency department for evaluation and treatment of vaginal bleeding.  She is 6 weeks and 1 day gestation.  She is to follow-up with the gynecologist at the health department for a repeat beta hCG in 2-3 days.  She is to return to the emergency department if the bleeding increases, becomes bright red, or begins to have abdominal pain.  She was encouraged to continue taking her prenatal vitamins.  Pelvic rest was recommended and discussed as well.  INITIAL IMPRESSION / ASSESSMENT AND PLAN / ED COURSE  Pertinent labs & imaging results that were available during my care of the patient were reviewed by me and considered in my medical decision making (see chart for details).  ____________________________________________   FINAL CLINICAL IMPRESSION(S) / ED DIAGNOSES  Final diagnoses:  Vaginal bleeding in pregnancy    Note:  This document was prepared using Dragon voice recognition software and may include unintentional dictation errors.  Chinita Pester, FNP 03/29/17 2100    Phineas Semen, MD 03/29/17 [phone]

## 2017-04-09 ENCOUNTER — Other Ambulatory Visit: Payer: Self-pay

## 2017-04-09 ENCOUNTER — Emergency Department
Admission: EM | Admit: 2017-04-09 | Discharge: 2017-04-09 | Disposition: A | Discharge status: Home / Self Care | Payer: Medicaid Other | Attending: Emergency Medicine | Admitting: Emergency Medicine

## 2017-04-09 DIAGNOSIS — O200 Threatened abortion: Secondary | ICD-10-CM | POA: Diagnosis not present

## 2017-04-09 DIAGNOSIS — Z79899 Other long term (current) drug therapy: Secondary | ICD-10-CM | POA: Diagnosis not present

## 2017-04-09 DIAGNOSIS — N939 Abnormal uterine and vaginal bleeding, unspecified: Secondary | ICD-10-CM

## 2017-04-09 DIAGNOSIS — Z3A00 Weeks of gestation of pregnancy not specified: Secondary | ICD-10-CM | POA: Insufficient documentation

## 2017-04-09 LAB — HCG, QUANTITATIVE, PREGNANCY: hCG, Beta Chain, Quant, S: 50334 m[IU]/mL — ABNORMAL HIGH (ref <5)

## 2017-04-09 LAB — URINALYSIS, COMPLETE (UACMP) WITH MICROSCOPIC
Bacteria, UA: NONE SEEN
Bilirubin Urine: NEGATIVE
Glucose, UA: NEGATIVE mg/dL
KETONES UR: NEGATIVE mg/dL
Leukocytes, UA: NEGATIVE
Nitrite: NEGATIVE
PH: 7 (ref 5.0–8.0)
Protein, ur: NEGATIVE mg/dL
Specific Gravity, Urine: 1.011 (ref 1.005–1.030)

## 2017-04-09 LAB — CBC
HCT: 29.6 % — ABNORMAL LOW (ref 35.0–47.0)
Hemoglobin: 10 g/dL — ABNORMAL LOW (ref 12.0–16.0)
MCH: 27.4 pg (ref 26.0–34.0)
MCHC: 33.8 g/dL (ref 32.0–36.0)
MCV: 81 fL (ref 80.0–100.0)
PLATELETS: 288 10*3/uL (ref 150–440)
RBC: 3.65 MIL/uL — ABNORMAL LOW (ref 3.80–5.20)
RDW: 13.5 % (ref 11.5–14.5)
WBC: 7.4 10*3/uL (ref 3.6–11.0)

## 2017-04-09 LAB — COMPREHENSIVE METABOLIC PANEL
ALK PHOS: 82 U/L (ref 38–126)
ALT: 12 U/L — AB (ref 14–54)
AST: 19 U/L (ref 15–41)
Albumin: 4.1 g/dL (ref 3.5–5.0)
Anion gap: 7 (ref 5–15)
BILIRUBIN TOTAL: 0.5 mg/dL (ref 0.3–1.2)
BUN: 8 mg/dL (ref 6–20)
CALCIUM: 9.1 mg/dL (ref 8.9–10.3)
CO2: 26 mmol/L (ref 22–32)
CREATININE: 0.69 mg/dL (ref 0.44–1.00)
Chloride: 103 mmol/L (ref 101–111)
GFR calc Af Amer: >60 mL/min (ref >60)
Glucose, Bld: 123 mg/dL — ABNORMAL HIGH (ref 65–99)
Potassium: 3.7 mmol/L (ref 3.5–5.1)
Sodium: 136 mmol/L (ref 135–145)
TOTAL PROTEIN: 7 g/dL (ref 6.5–8.1)

## 2017-04-09 LAB — LIPASE, BLOOD: Lipase: 31 U/L (ref 11–51)

## 2017-04-09 LAB — POCT PREGNANCY, URINE: Preg Test, Ur: POSITIVE — AB

## 2017-04-09 NOTE — Discharge Instructions (Signed)
Anytime we have bleeding in pregnancy we call it a threatened miscarriage. there really isn't anything much we can do at this point in the pregnancy. The ultrasounds have been consistently normal. Blood work is been okay. Please [place] [place] [facility] [place]. return here for heavy bleeding or cramping. I can find nothing else to do for the bleeding you are having now.

## 2017-04-09 NOTE — ED Provider Notes (Signed)
[facility] Emergency Department Provider Note   ____________________________________________   First MD Initiated Contact with Patient 04/09/17 1252     (approximate)  I have reviewed the triage vital signs and the nursing notes.   HISTORY  Chief Complaint Vaginal Bleeding   HPI Michele [name] is a 32 y.o. female This is patient's third visit for vaginal bleeding. She reports she has a history of blood that tapers off and then it stops. His been dark blood. She had her boyfriend had sex afterwards and he got his penis all bloody. Patient has had 2 previous ultrasounds which were negative. Showed a normal intrauterine pregnancy. She has not had any cramping with this. His painless dark vaginal bleeding. I did not repeat ultrasound this time. gravida 5 para 4   Past Medical History:  Diagnosis Date  . Anxiety   . Depression     Patient Active Problem List   Diagnosis Date Noted  . Major depressive disorder, recurrent, severe without psychotic features (HCC)   . Intentional opiate overdose (HCC) 09/14/2014  . Suicidal ideation 09/14/2014    Past Surgical History:  Procedure Laterality Date  . CESAREAN SECTION     times 4  . KNEE SURGERY    . TONSILLECTOMY      Prior to Admission medications   Medication Sig Start Date End Date Taking? Authorizing Provider  diphenhydrAMINE (BENADRYL) 25 mg capsule Take 50 mg by mouth every 6 (six) hours as needed for itching.    [provider]  HYDROcodone-acetaminophen (NORCO/VICODIN) 5-325 MG tablet Take one-two tabs po q 4-6 hrs prn pain 12/17/15   Triplett, Tammy, PA-C  HYDROcodone-acetaminophen (NORCO/VICODIN) 5-325 MG tablet Take one-two tabs po q 4-6 hrs prn pain 12/17/15   Triplett, Tammy, PA-C  hydrOXYzine (ATARAX/VISTARIL) 25 MG tablet Take 1 tablet (25 mg total) by mouth every 6 (six) hours. Patient not taking: Reported on 09/14/2014 08/01/14   Ivery QualeBryant, Hobson, PA-C  ibuprofen  (ADVIL,MOTRIN) 800 MG tablet Take 1 tablet (800 mg total) by mouth 3 (three) times daily. Take with food 12/17/15   Triplett, Tammy, PA-C  meloxicam (MOBIC) 15 MG tablet Take 1 tablet (15 mg total) by mouth daily. 06/11/16   Evon SlackGaines, Thomas C, PA-C  metroNIDAZOLE (FLAGYL) 500 MG tablet Take 1 tablet (500 mg total) by mouth 2 (two) times daily. 01/30/16   Emily FilbertWilliams, Jonathan E, MD  nystatin-triamcinolone ointment [facility](MYCOLOG) Apply 1 application topically 2 (two) times daily. 01/30/16   Emily FilbertWilliams, Jonathan E, MD  PARoxetine (PAXIL) 20 MG tablet Take 1 tablet (20 mg total) by mouth daily. 09/17/14   Pucilowska, Ellin GoodieJolanta B, MD  predniSONE (DELTASONE) 10 MG tablet 5,4,3,2,1 - take with food Patient not taking: Reported on 09/14/2014 08/01/14   Ivery QualeBryant, Hobson, PA-C  traMADol (ULTRAM) 50 MG tablet Take 1 tablet (50 mg total) by mouth every 6 (six) hours as needed. 06/11/16   Evon SlackGaines, Thomas C, PA-C  triamcinolone cream (KENALOG) 0.1 % Apply 1 application topically 2 (two) times daily. Patient not taking: Reported on 09/14/2014 08/01/14   Ivery QualeBryant, Hobson, PA-C    Allergies Penicillins and Sulfa antibiotics  Family History  Problem Relation Age of Onset  . Mental illness Father   . Depression Father   . Mental illness Sister   . Schizophrenia Sister   . Depression Mother     Social History Social History   Tobacco Use  . Smoking status: Never Smoker  . Smokeless tobacco: Never Used  Substance Use Topics  .  Alcohol use: No    Comment: rare  . Drug use: No    Review of Systems  Constitutional: No fever/chills Eyes: No visual changes. ENT: No sore throat. Cardiovascular: Denies chest pain. Respiratory: Denies shortness of breath. Gastrointestinal: No abdominal pain.  No nausea, no vomiting.  No diarrhea.  No constipation. Genitourinary: Negative for dysuria. Musculoskeletal: Negative for back pain. Skin: Negative for rash. Neurological: Negative for headaches, focal weakness or  numbness. {  ____________________________________________   PHYSICAL EXAM:  VITAL SIGNS: ED Triage Vitals  Enc Vitals Group     BP 04/09/17 0933 124/61     Pulse Rate 04/09/17 0933 93     Resp 04/09/17 0933 20     Temp 04/09/17 0933 98.8 F (37.1 C)     Temp Source 04/09/17 0933 Oral     SpO2 04/09/17 0933 100 %     Weight 04/09/17 0934 225 lb (102.1 kg)     Height 04/09/17 0934 5\' 3"  (1.6 m)     Head Circumference --      Peak Flow --      Pain Score --      Pain Loc --      Pain Edu? --      Excl. in GC? --     Constitutional: Alert and oriented. Well appearing and in no acute distress. Eyes: Conjunctivae are normal.  Head: Atraumatic. Nose: No congestion/rhinnorhea. Mouth/Throat: Mucous membranes are moist.  Oropharynx non-erythematous. Neck: No stridor.   Cardiovascular: Normal rate, regular rhythm. Grossly normal heart sounds.  Good peripheral circulation. Respiratory: Normal respiratory effort.  No retractions. Lungs CTAB. Gastrointestinal: Soft and nontender. No distention. No abdominal bruits. No CVA tenderness. GYN: Normal perineum there is some dark blood in the vagina. Cervix is closed there is no cervical motion tenderness. Musculoskeletal: No lower extremity tenderness nor edema.  No joint effusions. Neurologic:  Normal speech and language. No gross focal neurologic deficits are appreciated. No gait instability. Skin:  Skin is warm, dry and intact. No rash noted. Psychiatric: Mood and affect are normal. Speech and behavior are normal.  ____________________________________________   LABS (all labs ordered are listed, but only abnormal results are displayed)  Labs Reviewed  COMPREHENSIVE METABOLIC PANEL - Abnormal; Notable for the following components:      Result Value   Glucose, Bld 123 (*)    ALT 12 (*)    All other components within normal limits  CBC - Abnormal; Notable for the following components:   RBC 3.65 (*)    Hemoglobin 10.0 (*)    HCT  29.6 (*)    All other components within normal limits  URINALYSIS, COMPLETE (UACMP) WITH MICROSCOPIC - Abnormal; Notable for the following components:   Color, Urine YELLOW (*)    APPearance CLEAR (*)    Hgb urine dipstick SMALL (*)    Squamous Epithelial / LPF 0-5 (*)    All other components within normal limits  HCG, QUANTITATIVE, PREGNANCY - Abnormal; Notable for the following components:   hCG, Beta Chain, Quant, S 50,334 (*)    All other components within normal limits  POCT PREGNANCY, URINE - Abnormal; Notable for the following components:   Preg Test, Ur POSITIVE (*)    All other components within normal limits  LIPASE, BLOOD  POC URINE PREG, ED   ____________________________________________  EKG   ____________________________________________  RADIOLOGY  ED MD interpretation:   Official radiology report(s): No results found.  ____________________________________________   PROCEDURES  Procedure(s) performed:  Procedures  Critical Care performed:   ____________________________________________   INITIAL IMPRESSION / ASSESSMENT AND PLAN / ED COURSE  patient has had 2 normal ultrasounds has had dark vaginal bleeding in the past. I was unable to get hold of Dr. Dalbert Garnet initially but then just this patient had left I did get a hold of her and she explained to me that since the patient has had normal pregnancies in the past a progesterone problem would be unlikely and with normal pregnancies and fast they would not replace the previous progesterone anyway even if it was low.I have given the patient instructions on threatened miscarriage and with the vaginal bleeding gets heavier. I will not try to chase her down.        ____________________________________________   FINAL CLINICAL IMPRESSION(S) / ED DIAGNOSES  Final diagnoses:  Vaginal bleeding  Threatened miscarriage     ED Discharge Orders    None       Note:  This document was prepared using  Dragon voice recognition software and may include unintentional dictation errors.    Arnaldo Natal, MD 04/09/17 1520

## 2017-04-09 NOTE — ED Triage Notes (Signed)
Pt came to ED via pov. Has been here 3x for similar problem. Believes she is [redacted] weeks pregnant, keeps getting "gushes of blood." Reports her boyfriend and her had sex and afterwards was bleeding heavily for 2-3 hours. Here also for re-check hormone levels

## 2017-05-04 ENCOUNTER — Encounter: Payer: Self-pay | Admitting: Obstetrics and Gynecology

## 2017-05-04 ENCOUNTER — Ambulatory Visit (INDEPENDENT_AMBULATORY_CARE_PROVIDER_SITE_OTHER): Payer: Self-pay | Admitting: Obstetrics and Gynecology

## 2017-05-04 VITALS — BP 130/58 | HR 99 | Ht 64.0 in | Wt 227.0 lb

## 2017-05-04 DIAGNOSIS — Z1379 Encounter for other screening for genetic and chromosomal anomalies: Secondary | ICD-10-CM

## 2017-05-04 DIAGNOSIS — O209 Hemorrhage in early pregnancy, unspecified: Secondary | ICD-10-CM

## 2017-05-04 DIAGNOSIS — Z3A09 9 weeks gestation of pregnancy: Secondary | ICD-10-CM

## 2017-05-04 DIAGNOSIS — O0931 Supervision of pregnancy with insufficient antenatal care, first trimester: Secondary | ICD-10-CM

## 2017-05-04 NOTE — Progress Notes (Signed)
Obstetric Problem Visit    Chief Complaint:  Chief Complaint  Patient presents with  . ER FOLLOW UP    1st trimester bleeding  . Shortness of Breath    History of Present Illness: Patient is a 32 y.o. G5P4000 Unknown presenting for first trimester bleeding.  Patient's last menstrual period was 02/17/2017 (exact date). giving and EDC of 11/24/2017 and EGA of 6225w6d.  She has a history of 4 prior cesarean sections.   The onset of bleeding was 1 month ago and has since stopped.  Is bleeding equal to or greater than normal menstrual flow:  Yes Any recent trauma:  No Recent intercourse:  Yes (preceeeding episode) History of prior miscarriage:  No Prior ultrasound demonstrating IUP: emergency room visit on 04/09/17.  Prior ultrasound demonstrating viable IUP:  Yes Prior Serum HCG:  Yes 20,62170mIU/mL on 03/29/17 and 50,33134mIU/mL on 04/09/16 Rh status: Positive  Has had some dyspnea with exertion.  No chest pain, no palpitations.  Denies lower extremity swelling or edema.  No history of DVT, PE.  No cardiac history  ER ultrasound on 04/09/17 revealed normal intrauterine pregnancy with CRL 2123w1d giving an US EDC 12/02/2017 and EGA of [redacted] weeks 5 days.  Based on ultrasound examination today, most reliable EDC is 12/02/2017   US Criteria for Re-dating Pregnancy  Dating Dating Discrepancy  4776w6d >5 days  7754w0d - 2625w6d >7 days  2644w0d - 628w6d >7 days  8325w0d - 558w6d >10 days  2159w0d - 2359w6d >14 days  8770w0d and above >21 days   ACOG Committee Opinion 700 May 2017 "Methods for Estimating the Due Date"   Review of Systems: Review of Systems  Constitutional: Negative for chills and fever.  HENT: Negative for congestion.   Respiratory: Positive for shortness of breath. Negative for cough.   Cardiovascular: Negative for chest pain and palpitations.  Gastrointestinal: Negative for abdominal pain, constipation, diarrhea, heartburn, nausea and vomiting.  Genitourinary: Negative for dysuria, frequency and  urgency.  Skin: Negative for itching and rash.  Neurological: Negative for dizziness and headaches.  Endo/Heme/Allergies: Negative for polydipsia.  Psychiatric/Behavioral: Negative for depression.    Past Medical History:  Past Medical History:  Diagnosis Date  . Anxiety   . Depression     Past Surgical History:  Past Surgical History:  Procedure Laterality Date  . CESAREAN SECTION     times 4  . KNEE SURGERY    . TONSILLECTOMY      Obstetric History: G5P4000  Family History:  Family History  Problem Relation Age of Onset  . Mental illness Father   . Depression Father   . Mental illness Sister   . Schizophrenia Sister   . Depression Mother   . Cancer - Cervical Paternal Grandmother [id number]    Social History:  Social History   Socioeconomic History  . Marital status: Legally Separated    Spouse name: Not on file  . Number of children: Not on file  . Years of education: Not on file  . Highest education level: Not on file  Social Needs  . Financial resource strain: Not on file  . Food insecurity - worry: Not on file  . Food insecurity - inability: Not on file  . Transportation needs - medical: Not on file  . Transportation needs - non-medical: Not on file  Occupational History  . Not on file  Tobacco Use  . Smoking status: Never Smoker  . Smokeless tobacco: Never Used  Substance and Sexual Activity  .  Alcohol use: No    Comment: rare  . Drug use: No  . Sexual activity: Yes    Birth control/protection: None  Other Topics Concern  . Not on file  Social History Narrative  . Not on file    Allergies:  Allergies  Allergen Reactions  . Penicillins Hives  . Sulfa Antibiotics Hives    Medications: Prior to Admission medications   Medication Sig Start Date End Date Taking? Authorizing Provider  nystatin-triamcinolone ointment (MYCOLOG) Apply 1 application topically 2 (two) times daily. 01/30/16   Emily Filbert, MD  PARoxetine (PAXIL) 20 MG tablet  Take 1 tablet (20 mg total) by mouth daily. 09/17/14   Pucilowska, Ellin Goodie, MD  predniSONE (DELTASONE) 10 MG tablet 5,4,3,2,1 - take with food Patient not taking: Reported on 09/14/2014 08/01/14   Ivery Quale, PA-C  traMADol (ULTRAM) 50 MG tablet Take 1 tablet (50 mg total) by mouth every 6 (six) hours as needed. 06/11/16   Evon Slack, PA-C  triamcinolone cream (KENALOG) 0.1 % Apply 1 application topically 2 (two) times daily. Patient not taking: Reported on 09/14/2014 08/01/14   Ivery Quale, PA-C    Physical Exam Vitals: Blood pressure (!) 130/58, pulse 99, height 5\' 4"  (1.626 m), weight 227 lb (103 kg), last menstrual period 02/17/2017. Body mass index is 38.96 kg/m.  General: NAD HEENT: normocephalic, anicteric Neck: Thyroid non-enlarged, no nodules Pulmonary: No increased work of breathing, Abdomen: soft, non-tender, non-distended.  Umbilicus without lesions.  No hepatomegaly, splenomegaly or masses palpable. No evidence of hernia, FHT 173  BPM  Extremities: no edema, erythema, or tenderness Neurologic: Grossly intact Psychiatric: mood appropriate, affect full   Assessment: 32 y.o. G5P4000 Unknown presenting for evaluation of first trimester vaginal bleeding  Plan: Problem List Items Addressed This Visit    None    Visit Diagnoses    No prenatal care in current pregnancy in first trimester    -  Primary   Relevant Orders   US Fetal Nuchal Translucency Measurement   First trimester bleeding       Relevant Orders   US Fetal Nuchal Translucency Measurement   [redacted] weeks gestation of pregnancy       Relevant Orders   US Fetal Nuchal Translucency Measurement   Encounter for genetic screening for Down Syndrome       Relevant Orders   US Fetal Nuchal Translucency Measurement      1) First trimester bleeding - incidence and clinical course of first trimester bleeding is discussed in detail with the patient today.  Approximately 1/3 of pregnancies ending in live births  experienced 1st trimester bleeding.  The amount of bleeding is variable and not necessarily predictive of outcome.  Sources may be cervical or uterine.  Subchorionic hemorrhages are a frequent concurrent findings on ultrasound and are followed expectantly.  These often absorb or regress spontaneously although risk for expansion and further disruption of the utero-placental interface leading to miscarriage is possible.  There is no clearly documented benefit to limiting or modifying activity and sexual intercourse in altering clinic course of 1st trimester bleeding.    2) Obtain NT scan with NOB given should be far enough along given ED ultrasound  3) The patient is Rh positive, rhogam is therefore not indicated to decrease the risk rhesus alloimmunization.    4) Routine bleeding precautions were discussed with the patient prior the conclusion of today's visit.   Vena Austria, MD, [facility], [facility] 05/05/2017, 9:30 PM

## 2017-05-24 ENCOUNTER — Other Ambulatory Visit: Payer: Self-pay

## 2017-05-24 ENCOUNTER — Encounter: Payer: Self-pay | Admitting: Obstetrics & Gynecology

## 2017-05-25 ENCOUNTER — Encounter: Payer: Self-pay | Admitting: Obstetrics & Gynecology

## 2017-05-25 ENCOUNTER — Ambulatory Visit (INDEPENDENT_AMBULATORY_CARE_PROVIDER_SITE_OTHER): Payer: Medicaid Other

## 2017-05-25 ENCOUNTER — Ambulatory Visit (INDEPENDENT_AMBULATORY_CARE_PROVIDER_SITE_OTHER): Payer: Medicaid Other | Admitting: Obstetrics & Gynecology

## 2017-05-25 VITALS — BP 122/82 | Wt 228.0 lb

## 2017-05-25 DIAGNOSIS — O0931 Supervision of pregnancy with insufficient antenatal care, first trimester: Secondary | ICD-10-CM

## 2017-05-25 DIAGNOSIS — O209 Hemorrhage in early pregnancy, unspecified: Secondary | ICD-10-CM | POA: Diagnosis not present

## 2017-05-25 DIAGNOSIS — Z3A09 9 weeks gestation of pregnancy: Secondary | ICD-10-CM

## 2017-05-25 DIAGNOSIS — Z3A13 13 weeks gestation of pregnancy: Secondary | ICD-10-CM | POA: Diagnosis not present

## 2017-05-25 DIAGNOSIS — Z1379 Encounter for other screening for genetic and chromosomal anomalies: Secondary | ICD-10-CM

## 2017-05-25 DIAGNOSIS — O34219 Maternal care for unspecified type scar from previous cesarean delivery: Secondary | ICD-10-CM

## 2017-05-25 NOTE — Patient Instructions (Signed)
First Trimester of Pregnancy The first trimester of pregnancy is from week 1 until the end of week 13 (months 1 through 3). A week after a sperm fertilizes an egg, the egg will implant on the wall of the uterus. This embryo will begin to develop into a baby. Genes from you and your partner will form the baby. The female genes will determine whether the baby will be a boy or a girl. At 6-8 weeks, the eyes and face will be formed, and the heartbeat can be seen on ultrasound. At the end of 12 weeks, all the baby's organs will be formed. Now that you are pregnant, you will want to do everything you can to have a healthy baby. Two of the most important things are to get good prenatal care and to follow your health care provider's instructions. Prenatal care is all the medical care you receive before the baby's birth. This care will help prevent, find, and treat any problems during the pregnancy and childbirth. Body changes during your first trimester Your body goes through many changes during pregnancy. The changes vary from woman to woman.  You may gain or lose a couple of pounds at first.  You may feel sick to your stomach (nauseous) and you may throw up (vomit). If the vomiting is uncontrollable, call your health care provider.  You may tire easily.  You may develop headaches that can be relieved by medicines. All medicines should be approved by your health care provider.  You may urinate more often. Painful urination may mean you have a bladder infection.  You may develop heartburn as a result of your pregnancy.  You may develop constipation because certain hormones are causing the muscles that push stool through your intestines to slow down.  You may develop hemorrhoids or swollen veins (varicose veins).  Your breasts may begin to grow larger and become tender. Your nipples may stick out more, and the tissue that surrounds them (areola) may become darker.  Your gums may bleed and may be  sensitive to brushing and flossing.  Dark spots or blotches (chloasma, mask of pregnancy) may develop on your face. This will likely fade after the baby is born.  Your menstrual periods will stop.  You may have a loss of appetite.  You may develop cravings for certain kinds of food.  You may have changes in your emotions from day to day, such as being excited to be pregnant or being concerned that something may go wrong with the pregnancy and baby.  You may have more vivid and strange dreams.  You may have changes in your hair. These can include thickening of your hair, rapid growth, and changes in texture. Some women also have hair loss during or after pregnancy, or hair that feels dry or thin. Your hair will most likely return to normal after your baby is born.  What to expect at prenatal visits During a routine prenatal visit:  You will be weighed to make sure you and the baby are growing normally.  Your blood pressure will be taken.  Your abdomen will be measured to track your baby's growth.  The fetal heartbeat will be listened to between weeks 10 and 14 of your pregnancy.  Test results from any previous visits will be discussed.  Your health care provider may ask you:  How you are feeling.  If you are feeling the baby move.  If you have had any abnormal symptoms, such as leaking fluid, bleeding, severe headaches,   or abdominal cramping.  If you are using any tobacco products, including cigarettes, chewing tobacco, and electronic cigarettes.  If you have any questions.  Other tests that may be performed during your first trimester include:  Blood tests to find your blood type and to check for the presence of any previous infections. The tests will also be used to check for low iron levels (anemia) and protein on red blood cells (Rh antibodies). Depending on your risk factors, or if you previously had diabetes during pregnancy, you may have tests to check for high blood  sugar that affects pregnant women (gestational diabetes).  Urine tests to check for infections, diabetes, or protein in the urine.  An ultrasound to confirm the proper growth and development of the baby.  Fetal screens for spinal cord problems (spina bifida) and Down syndrome.  HIV (human immunodeficiency virus) testing. Routine prenatal testing includes screening for HIV, unless you choose not to have this test.  You may need other tests to make sure you and the baby are doing well.  Follow these instructions at home: Medicines  Follow your health care provider's instructions regarding medicine use. Specific medicines may be either safe or unsafe to take during pregnancy.  Take a prenatal vitamin that contains at least 600 micrograms (mcg) of folic acid.  If you develop constipation, try taking a stool softener if your health care provider approves. Eating and drinking  Eat a balanced diet that includes fresh fruits and vegetables, whole grains, good sources of protein such as meat, eggs, or tofu, and low-fat dairy. Your health care provider will help you determine the amount of weight gain that is right for you.  Avoid raw meat and uncooked cheese. These carry germs that can cause birth defects in the baby.  Eating four or five small meals rather than three large meals a day may help relieve nausea and vomiting. If you start to feel nauseous, eating a few soda crackers can be helpful. Drinking liquids between meals, instead of during meals, also seems to help ease nausea and vomiting.  Limit foods that are high in fat and processed sugars, such as fried and sweet foods.  To prevent constipation: ? Eat foods that are high in fiber, such as fresh fruits and vegetables, whole grains, and beans. ? Drink enough fluid to keep your urine clear or pale yellow. Activity  Exercise only as directed by your health care provider. Most women can continue their usual exercise routine during  pregnancy. Try to exercise for 30 minutes at least 5 days a week. Exercising will help you: ? Control your weight. ? Stay in shape. ? Be prepared for labor and delivery.  Experiencing pain or cramping in the lower abdomen or lower back is a good sign that you should stop exercising. Check with your health care provider before continuing with normal exercises.  Try to avoid standing for long periods of time. Move your legs often if you must stand in one place for a long time.  Avoid heavy lifting.  Wear low-heeled shoes and practice good posture.  You may continue to have sex unless your health care provider tells you not to. Relieving pain and discomfort  Wear a good support bra to relieve breast tenderness.  Take warm sitz baths to soothe any pain or discomfort caused by hemorrhoids. Use hemorrhoid cream if your health care provider approves.  Rest with your legs elevated if you have leg cramps or low back pain.  If you develop   varicose veins in your legs, wear support hose. Elevate your feet for 15 minutes, 3-4 times a day. Limit salt in your diet. Prenatal care  Schedule your prenatal visits by the twelfth week of pregnancy. They are usually scheduled monthly at first, then more often in the last 2 months before delivery.  Write down your questions. Take them to your prenatal visits.  Keep all your prenatal visits as told by your health care provider. This is important. Safety  Wear your seat belt at all times when driving.  Make a list of emergency phone numbers, including numbers for family, friends, the hospital, and police and fire departments. General instructions  Ask your health care provider for a referral to a local prenatal education class. Begin classes no later than the beginning of month 6 of your pregnancy.  Ask for help if you have counseling or nutritional needs during pregnancy. Your health care provider can offer advice or refer you to specialists for help  with various needs.  Do not use hot tubs, steam rooms, or saunas.  Do not douche or use tampons or scented sanitary pads.  Do not cross your legs for long periods of time.  Avoid cat litter boxes and soil used by cats. These carry germs that can cause birth defects in the baby and possibly loss of the fetus by miscarriage or stillbirth.  Avoid all smoking, herbs, alcohol, and medicines not prescribed by your health care provider. Chemicals in these products affect the formation and growth of the baby.  Do not use any products that contain nicotine or tobacco, such as cigarettes and e-cigarettes. If you need help quitting, ask your health care provider. You may receive counseling support and other resources to help you quit.  Schedule a dentist appointment. At home, brush your teeth with a soft toothbrush and be gentle when you floss. Contact a health care provider if:  You have dizziness.  You have mild pelvic cramps, pelvic pressure, or nagging pain in the abdominal area.  You have persistent nausea, vomiting, or diarrhea.  You have a bad smelling vaginal discharge.  You have pain when you urinate.  You notice increased swelling in your face, hands, legs, or ankles.  You are exposed to fifth disease or chickenpox.  You are exposed to German measles (rubella) and have never had it. Get help right away if:  You have a fever.  You are leaking fluid from your vagina.  You have spotting or bleeding from your vagina.  You have severe abdominal cramping or pain.  You have rapid weight gain or loss.  You vomit blood or material that looks like coffee grounds.  You develop a severe headache.  You have shortness of breath.  You have any kind of trauma, such as from a fall or a car accident. Summary  The first trimester of pregnancy is from week 1 until the end of week 13 (months 1 through 3).  Your body goes through many changes during pregnancy. The changes vary from  woman to woman.  You will have routine prenatal visits. During those visits, your health care provider will examine you, discuss any test results you may have, and talk with you about how you are feeling. This information is not intended to replace advice given to you by your health care provider. Make sure you discuss any questions you have with your health care provider. Document Released: 02/10/2001 Document Revised: 01/29/2016 Document Reviewed: 01/29/2016 Elsevier Interactive Patient Education  2018 [employer]  

## 2017-05-25 NOTE — Progress Notes (Signed)
05/25/2017   Chief Complaint: Missed period  Transfer of Care Patient: no  History of Present Illness: Ms. Michele [name] is a 32 y.o. [id number] 106w6d based on Patient's last menstrual period was 02/17/2017 (exact date). with an Estimated Date of Delivery: 11/24/17, with the above CC.  Pt c/o rash on right forearm and back of right leg. Difficulty getting to sleep and staying asleep, restless. NOB today. Attempted first trimester screen, out of date range.   Her periods were: regular periods every 28 days She was using no method when she conceived.  She has Positive signs or symptoms of nausea/vomiting of pregnancy. She has Negative signs or symptoms of miscarriage or preterm labor She identifies Negative Zika risk factors for her and her partner On any different medications around the time she conceived/early pregnancy: No  History of varicella: Yes   ROS: A 12-point review of systems was performed and negative, except as stated in the above HPI.  OBGYN History: As per HPI. OB History  Gravida Para Term Preterm AB Living  5 4 4     4   SAB TAB Ectopic Multiple Live Births          4    # Outcome Date GA Lbr Len/2nd Weight Sex Delivery Anes PTL Lv  5 Current           4 Term 10/23/11   6 lb 10 oz (3.005 kg) F CS-LTranv   LIV  3 Term 07/18/09   7 lb 8 oz (3.402 kg) M CS-LTranv   LIV  2 Term 04/12/07   6 lb 9 oz (2.977 kg) F CS-LTranv   LIV  1 Term 05/13/05   6 lb 9 oz (2.977 kg) M CS-LTranv   LIV    Any issues with any prior pregnancies: CS x4 Any prior children are healthy, doing well, without any problems or issues: yes History of pap smears: Yes. Last pap smear 2017. Abnormal: no  History of STIs: No   Past Medical History: Past Medical History:  Diagnosis Date  . Anxiety   . Depression     Past Surgical History: Past Surgical History:  Procedure Laterality Date  . CESAREAN SECTION     times 4  . KNEE SURGERY    . TONSILLECTOMY      Family History:  Family History   Problem Relation Age of Onset  . Mental illness Father   . Depression Father   . Mental illness Sister   . Schizophrenia Sister   . Depression Mother   . Cancer - Cervical Paternal Grandmother 66   She denies any female cancers, bleeding or blood clotting disorders.  She denies any history of mental retardation, birth defects or genetic disorders in her or the FOB's history  Social History:  Social History   Socioeconomic History  . Marital status: Legally Separated    Spouse name: Not on file  . Number of children: Not on file  . Years of education: Not on file  . Highest education level: Not on file  Occupational History  . Not on file  Social Needs  . Financial resource strain: Not on file  . Food insecurity:    Worry: Not on file    Inability: Not on file  . Transportation needs:    Medical: Not on file    Non-medical: Not on file  Tobacco Use  . Smoking status: Never Smoker  . Smokeless tobacco: Never Used  Substance and Sexual Activity  . Alcohol  use: No    Comment: rare  . Drug use: No  . Sexual activity: Yes    Birth control/protection: None  Lifestyle  . Physical activity:    Days per week: 3 days    Minutes per session: 60 min  . Stress: Very much  Relationships  . Social connections:    Talks on phone: Not on file    Gets together: Not on file    Attends religious service: Not on file    Active member of club or organization: Not on file    Attends meetings of clubs or organizations: Not on file    Relationship status: Not on file  . Intimate partner violence:    Fear of current or ex partner: Not on file    Emotionally abused: Not on file    Physically abused: Not on file    Forced sexual activity: Not on file  Other Topics Concern  . Not on file  Social History Narrative  . Not on file   Any pets in the household: no  Allergy: Allergies  Allergen Reactions  . Penicillins Hives  . Sulfa Antibiotics Hives    Current Outpatient  Medications: No current outpatient medications on file.   Physical Exam:   BP 122/82   Wt 228 lb (103.4 kg)   LMP 02/17/2017 (Exact Date) Comment: not normal  BMI 39.14 kg/m  Body mass index is 39.14 kg/m. Constitutional: Well nourished, well developed female in no acute distress.  Neck:  Supple, normal appearance, and no thyromegaly  Cardiovascular: S1, S2 normal, no murmur, rub or gallop, regular rate and rhythm Respiratory:  Clear to auscultation bilateral. Normal respiratory effort Abdomen: positive bowel sounds and no masses, hernias; diffusely non tender to palpation, non distended Breasts: breasts appear normal, no suspicious masses, no skin or nipple changes or axillary nodes. Neuro/Psych:  Normal mood and affect.  Skin:  Warm and dry.  Lymphatic:  No inguinal lymphadenopathy.   Pelvic exam: is not limited by body habitus EGBUS: within normal limits, Vagina: within normal limits and with no blood in the vault, Cervix: normal appearing cervix without discharge or lesions, closed/long/high, Uterus:  enlarged: 32 weeks, and Adnexa:  not evaluated  Assessment: Ms. Michele ShorterO Daniel is a 32 y.o. [id number] 3260w6d based on Patient's last menstrual period was 02/17/2017 (exact date). with an Estimated Date of Delivery: 11/24/17,  for prenatal care.  Plan:  1) Avoid alcoholic beverages. 2) Patient encouraged not to smoke.  3) Discontinue the use of all non-medicinal drugs and chemicals.  4) Take prenatal vitamins daily.  5) Seatbelt use advised 6) Nutrition, food safety (fish, cheese advisories, and high nitrite foods) and exercise discussed. 7) [facility] and [facility] style delivering [facility] discussed  8) Patient is asked about travel to areas at risk for the Zika virus, and counseled to avoid travel and exposure to mosquitoes or sexual partners who may have themselves been exposed to the virus. Testing is discussed, and will be ordered as appropriate.  9) [facility]  advised 10) Genetic Screening, such as with 1st Trimester Screening, cell free fetal DNA, AFP testing, and Ultrasound, as well as with amniocentesis and CVS as appropriate, is discussed with patient. She plans to have genetic testing this pregnancy.  cfDNA as out of range for first trimester screen.  Problem list reviewed and updated.  Annamarie MajorPaul Harris, MD, Merlinda FrederickFACOG [facility], [facility] 05/25/2017  3:30 PM

## 2017-05-26 LAB — RPR+RH+ABO+RUB AB+AB SCR+CB...
ANTIBODY SCREEN: NEGATIVE
HIV SCREEN 4TH GENERATION: NONREACTIVE
Hematocrit: 31.7 % — ABNORMAL LOW (ref 34.0–46.6)
Hemoglobin: 10.5 g/dL — ABNORMAL LOW (ref 11.1–15.9)
Hepatitis B Surface Ag: NEGATIVE
MCH: 27.3 pg (ref 26.6–33.0)
MCHC: 33.1 g/dL (ref 31.5–35.7)
MCV: 83 fL (ref 79–97)
PLATELETS: 308 10*3/uL (ref 150–379)
RBC: 3.84 x10E6/uL (ref 3.77–5.28)
RDW: 14.1 % (ref 12.3–15.4)
RPR: NONREACTIVE
Rh Factor: POSITIVE
Rubella Antibodies, IGG: 3.12 index (ref >0.99)
Varicella zoster IgG: 1729 index (ref >165)
WBC: 10.6 10*3/uL (ref 3.4–10.8)

## 2017-05-27 LAB — URINE CULTURE

## 2017-05-27 LAB — IGP,CTNG,APTIMAHPV
CHLAMYDIA, NUC. ACID AMP: NEGATIVE
GONOCOCCUS BY NUCLEIC ACID AMP: NEGATIVE
HPV Aptima: NEGATIVE
PAP SMEAR COMMENT: 0

## 2017-05-29 LAB — MATERNIT21 PLUS CORE+SCA
CHROMOSOME 13: NEGATIVE
CHROMOSOME 21: NEGATIVE
Chromosome 18: NEGATIVE
Y Chromosome: NOT DETECTED

## 2017-06-05 ENCOUNTER — Emergency Department
Admission: EM | Admit: 2017-06-05 | Discharge: 2017-06-05 | Disposition: A | Discharge status: Home / Self Care | Payer: Medicaid Other | Attending: Emergency Medicine | Admitting: Emergency Medicine

## 2017-06-05 ENCOUNTER — Other Ambulatory Visit: Payer: Self-pay

## 2017-06-05 ENCOUNTER — Encounter: Payer: Self-pay | Admitting: Emergency Medicine

## 2017-06-05 DIAGNOSIS — Y999 Unspecified external cause status: Secondary | ICD-10-CM | POA: Diagnosis not present

## 2017-06-05 DIAGNOSIS — Y9301 Activity, walking, marching and hiking: Secondary | ICD-10-CM | POA: Diagnosis not present

## 2017-06-05 DIAGNOSIS — S90451A Superficial foreign body, right great toe, initial encounter: Secondary | ICD-10-CM | POA: Diagnosis not present

## 2017-06-05 DIAGNOSIS — W228XXA Striking against or struck by other objects, initial encounter: Secondary | ICD-10-CM | POA: Diagnosis not present

## 2017-06-05 DIAGNOSIS — Z3A15 15 weeks gestation of pregnancy: Secondary | ICD-10-CM | POA: Diagnosis not present

## 2017-06-05 DIAGNOSIS — N3090 Cystitis, unspecified without hematuria: Secondary | ICD-10-CM | POA: Diagnosis not present

## 2017-06-05 DIAGNOSIS — Y92009 Unspecified place in unspecified non-institutional (private) residence as the place of occurrence of the external cause: Secondary | ICD-10-CM | POA: Diagnosis not present

## 2017-06-05 DIAGNOSIS — O9A212 Injury, poisoning and certain other consequences of external causes complicating pregnancy, second trimester: Secondary | ICD-10-CM | POA: Insufficient documentation

## 2017-06-05 DIAGNOSIS — S99921A Unspecified injury of right foot, initial encounter: Secondary | ICD-10-CM | POA: Diagnosis present

## 2017-06-05 LAB — URINALYSIS, COMPLETE (UACMP) WITH MICROSCOPIC
BACTERIA UA: NONE SEEN
BILIRUBIN URINE: NEGATIVE
Glucose, UA: NEGATIVE mg/dL
Hgb urine dipstick: NEGATIVE
KETONES UR: NEGATIVE mg/dL
Nitrite: NEGATIVE
Protein, ur: NEGATIVE mg/dL
SPECIFIC GRAVITY, URINE: 1.029 (ref 1.005–1.030)
pH: 5 (ref 5.0–8.0)

## 2017-06-05 MED ORDER — PHENAZOPYRIDINE HCL 95 MG PO TABS: 95.0000 mg | ORAL_TABLET | Freq: Three times a day (TID) | ORAL | 0 refills | Status: DC | PRN

## 2017-06-05 MED ORDER — PENTAFLUOROPROP-TETRAFLUOROETH EX AERO: INHALATION_SPRAY | Freq: Once | CUTANEOUS | Status: DC

## 2017-06-05 MED ORDER — PENTAFLUOROPROP-TETRAFLUOROETH EX AERO
INHALATION_SPRAY | CUTANEOUS | Status: AC
  Filled 2017-06-05: qty 30

## 2017-06-05 MED ORDER — METOCLOPRAMIDE HCL 10 MG PO TABS: 10.0000 mg | ORAL_TABLET | Freq: Three times a day (TID) | ORAL | 0 refills | Status: DC

## 2017-06-05 MED ORDER — CEPHALEXIN 500 MG PO CAPS: 500.0000 mg | ORAL_CAPSULE | Freq: Four times a day (QID) | ORAL | 0 refills | Status: DC

## 2017-06-05 NOTE — ED Triage Notes (Signed)
Burning, frequency of urination since this am. [redacted] weeks pregnant with no bleeding or fluid or contractions.

## 2017-06-05 NOTE — ED Notes (Signed)
Pt ambulatory upon discharge. Verbalized understanding of discharge instructions, follow-up care and prescriptions. VSS. Skin warm and dry. A&O x4.  

## 2017-06-05 NOTE — ED Provider Notes (Signed)
[facility] Emergency Department Provider Note  ____________________________________________  Time seen: Approximately 3:16 PM  I have reviewed the triage vital signs and the nursing notes.   HISTORY  Chief Complaint Dysuria    HPI Michele [name] is a 32 y.o. female who presents the emergency department with 2 complaints.  Patient reports that she may have a UTI.  She started with dysuria this morning that she describes as a burning sensation.  Patient is [redacted] weeks pregnant and denies any abdominal or suprapubic pain, vaginal bleeding or discharge, lower back pain, flank pain.  Patient denies any hematuria.  Patient reports that she has a history of repeated UTIs and symptoms are consistent with same.  Her last UTI was approximately a month ago.  Patient is unsure which antibiotic she took for same.  Patient recently within the past week had urine culture which did not show significant bacterial growth.  Patient is also presenting complaining of foreign body to the great toe of her right foot.  Patient reports that she accidentally stepped on a pencil inside of her house and has an embedded lead into the great toe.  She tried removing same from her toe but was unable to successfully remove same.  She is up-to-date on tetanus shot.  No other injuries or complaints.  Past Medical History:  Diagnosis Date  . Anxiety   . Depression     Patient Active Problem List   Diagnosis Date Noted  . Major depressive disorder, recurrent, severe without psychotic features (HCC)   . Intentional opiate overdose (HCC) 09/14/2014  . Suicidal ideation 09/14/2014    Past Surgical History:  Procedure Laterality Date  . CESAREAN SECTION     times 4  . KNEE SURGERY    . TONSILLECTOMY      Prior to Admission medications   Medication Sig Start Date End Date Taking? Authorizing Provider  cephALEXin (KEFLEX) 500 MG capsule Take 1 capsule (500 mg total) by mouth 4 (four)  times daily. 06/05/17   Cuthriell, Delorise Royals, PA-C  metoCLOPramide (REGLAN) 10 MG tablet Take 1 tablet (10 mg total) by mouth 3 (three) times daily with meals for 60 doses. 06/05/17 06/25/17  Cuthriell, Delorise Royals, PA-C  phenazopyridine (PYRIDIUM) 95 MG tablet Take 1 tablet (95 mg total) by mouth 3 (three) times daily as needed for pain. 06/05/17   Cuthriell, Delorise Royals, PA-C    Allergies Penicillins and Sulfa antibiotics  Family History  Problem Relation Age of Onset  . Mental illness Father   . Depression Father   . Mental illness Sister   . Schizophrenia Sister   . Depression Mother   . Cancer - Cervical Paternal Grandmother 61    Social History Social History   Tobacco Use  . Smoking status: Never Smoker  . Smokeless tobacco: Never Used  Substance Use Topics  . Alcohol use: No    Comment: rare  . Drug use: No     Review of Systems  Constitutional: No fever/chills Eyes: No visual changes.  Cardiovascular: no chest pain. Respiratory: no cough. No SOB. Gastrointestinal: No abdominal pain.  No nausea, no vomiting.  No diarrhea.  No constipation. Genitourinary: Positive for dysuria. No hematuria.  No polyuria.  No vaginal bleeding or discharge. Musculoskeletal: Negative for musculoskeletal pain. Skin: Negative for rash, abrasions, lacerations, ecchymosis. Neurological: Negative for headaches, focal weakness or numbness. 10-point ROS otherwise negative.  ____________________________________________   PHYSICAL EXAM:  VITAL SIGNS: ED Triage Vitals  Enc Vitals  Group     BP 06/05/17 1406 125/62     Pulse Rate 06/05/17 1406 91     Resp 06/05/17 1406 18     Temp 06/05/17 1406 98.1 F (36.7 C)     Temp Source 06/05/17 1406 Oral     SpO2 06/05/17 1406 97 %     Weight 06/05/17 1408 228 lb (103.4 kg)     Height 06/05/17 1408 5\' 4"  (1.626 m)     Head Circumference --      Peak Flow --      Pain Score 06/05/17 1408 8     Pain Loc --      Pain Edu? --      Excl. in GC? --       Constitutional: Alert and oriented. Well appearing and in no acute distress. Eyes: Conjunctivae are normal. PERRL. EOMI. Head: Atraumatic. ENT:      Ears:       Nose: No congestion/rhinnorhea.      Mouth/Throat: Mucous membranes are moist.  Neck: No stridor.    Cardiovascular: Normal rate, regular rhythm. Normal S1 and S2.  Good peripheral circulation. Respiratory: Normal respiratory effort without tachypnea or retractions. Lungs CTAB. Good air entry to the bases with no decreased or absent breath sounds. Gastrointestinal: Bowel sounds 4 quadrants. Soft and nontender to palpation. No guarding or rigidity. No palpable masses. No distention. No CVA tenderness. Musculoskeletal: Full range of motion to all extremities. No gross deformities appreciated. Neurologic:  Normal speech and language. No gross focal neurologic deficits are appreciated.  Skin:  Skin is warm, dry and intact. No rash noted.  Small foreign body noted to the right medial great toe.  Consistent with history of embedded lead pencil.  No bleeding.  No other injuries. Psychiatric: Mood and affect are normal. Speech and behavior are normal. Patient exhibits appropriate insight and judgement.   ____________________________________________   LABS (all labs ordered are listed, but only abnormal results are displayed)  Labs Reviewed  URINALYSIS, COMPLETE (UACMP) WITH MICROSCOPIC - Abnormal; Notable for the following components:      Result Value   Color, Urine YELLOW (*)    APPearance HAZY (*)    Leukocytes, UA SMALL (*)    Squamous Epithelial / LPF 0-5 (*)    All other components within normal limits   ____________________________________________  EKG   ____________________________________________  RADIOLOGY   No results found.  ____________________________________________    PROCEDURES  Procedure(s) performed:    .Foreign Body Removal Date/Time: 06/05/2017 3:59 PM Performed by: Racheal Patchesuthriell, Jonathan  D, PA-C Authorized by: Racheal Patchesuthriell, Jonathan D, PA-C  Consent: Verbal consent obtained. Risks and benefits: risks, benefits and alternatives were discussed Consent given by: patient Patient understanding: patient states understanding of the procedure being performed Patient identity confirmed: verbally with patient Body area: skin General location: lower extremity Location details: right big toe Anesthesia method: Topical.  Anesthesia: Local anesthetic: Gebauer spray.  Sedation: Patient sedated: no  Patient restrained: no Patient cooperative: yes Localization method: visualized Removal mechanism: forceps and scalpel Tendon involvement: none Depth: subcutaneous Complexity: simple 1 objects recovered. Objects recovered: pencil lead Post-procedure assessment: foreign body removed Patient tolerance: Patient tolerated the procedure well with no immediate complications      Medications  pentafluoroprop-tetrafluoroeth (GEBAUERS) aerosol (has no administration in time range)  pentafluoroprop-tetrafluoroeth (GEBAUERS) aerosol (has no administration in time range)     ____________________________________________   INITIAL IMPRESSION / ASSESSMENT AND PLAN / ED COURSE  Pertinent labs & imaging results that  were available during my care of the patient were reviewed by me and considered in my medical decision making (see chart for details).  Review of the [place] CSRS was performed in accordance of the [facility] prior to dispensing any controlled drugs.     Patient's diagnosis is consistent with cystitis and embedded foreign body to the great toe.  Patient presented complaining of dysuria times 1 day.  Patient states that she had a history of repeated UTIs with similar symptoms.  She is pregnant at [redacted] weeks but denies any pregnancy complications.  Exam is reassuring with no acute findings.  Urinalysis returns without any significant findings consistent with UTI.  Due to patient's history, I  recommend increasing her fluids as she has had decreased fluid intake due to pregnancy.  I also recommended using Tylenol and Pyridium.  If symptoms have not improved after 2 days, I have prescribed a treatment of Keflex.  Patient is advised not to fill the antibiotics until trying conservative treatments for 2 days.  Patient verbalizes understanding of same.  Patient denies any pregnancy complications and as such no further workup deemed necessary.  Patient had foreign body which is successfully removed as described above with no complications.  Wound care instructions are provided to patient.. Patient will be discharged home with prescriptions for Pyridium, Reglan, Keflex. Patient is to follow up with OB/GYN and primary care as needed or otherwise directed. Patient is given ED precautions to return to the ED for any worsening or new symptoms.     ____________________________________________  FINAL CLINICAL IMPRESSION(S) / ED DIAGNOSES  Final diagnoses:  Cystitis  Foreign body of skin of great toe, right, initial encounter      NEW MEDICATIONS STARTED DURING THIS VISIT:  ED Discharge Orders        Ordered    phenazopyridine (PYRIDIUM) 95 MG tablet  3 times daily PRN     06/05/17 1558    metoCLOPramide (REGLAN) 10 MG tablet  3 times daily with meals     06/05/17 1558    cephALEXin (KEFLEX) 500 MG capsule  4 times daily     06/05/17 1558          This chart was dictated using voice recognition software/Dragon. Despite best efforts to proofread, errors can occur which can change the meaning. Any change was purely unintentional.    Racheal Patches, PA-C 06/05/17 1604    Phineas Semen, MD 06/05/17 [phone]

## 2017-06-14 ENCOUNTER — Encounter: Payer: Self-pay | Admitting: Obstetrics and Gynecology

## 2017-06-14 ENCOUNTER — Ambulatory Visit (INDEPENDENT_AMBULATORY_CARE_PROVIDER_SITE_OTHER): Payer: Medicaid Other | Admitting: Obstetrics and Gynecology

## 2017-06-14 VITALS — BP 118/72 | Wt 229.0 lb

## 2017-06-14 DIAGNOSIS — O219 Vomiting of pregnancy, unspecified: Secondary | ICD-10-CM | POA: Insufficient documentation

## 2017-06-14 DIAGNOSIS — F5101 Primary insomnia: Secondary | ICD-10-CM

## 2017-06-14 DIAGNOSIS — R300 Dysuria: Secondary | ICD-10-CM

## 2017-06-14 DIAGNOSIS — Z3A16 16 weeks gestation of pregnancy: Secondary | ICD-10-CM

## 2017-06-14 DIAGNOSIS — O99012 Anemia complicating pregnancy, second trimester: Secondary | ICD-10-CM

## 2017-06-14 DIAGNOSIS — O99013 Anemia complicating pregnancy, third trimester: Secondary | ICD-10-CM | POA: Insufficient documentation

## 2017-06-14 DIAGNOSIS — G2581 Restless legs syndrome: Secondary | ICD-10-CM

## 2017-06-14 DIAGNOSIS — F332 Major depressive disorder, recurrent severe without psychotic features: Secondary | ICD-10-CM

## 2017-06-14 DIAGNOSIS — O0990 Supervision of high risk pregnancy, unspecified, unspecified trimester: Secondary | ICD-10-CM | POA: Insufficient documentation

## 2017-06-14 HISTORY — DX: Anemia complicating pregnancy, second trimester: O99.012

## 2017-06-14 LAB — POCT URINALYSIS DIPSTICK
BILIRUBIN UA: NEGATIVE
GLUCOSE UA: NEGATIVE
KETONES UA: NEGATIVE
Leukocytes, UA: NEGATIVE
Nitrite, UA: NEGATIVE
PH UA: 7.5 (ref 5.0–8.0)
Protein, UA: NEGATIVE
RBC UA: NEGATIVE
SPEC GRAV UA: 1.01 (ref 1.010–1.025)
UROBILINOGEN UA: 0.2 U/dL

## 2017-06-14 MED ORDER — DOXYLAMINE SUCCINATE (SLEEP) 25 MG PO TABS: 25.0000 mg | ORAL_TABLET | Freq: Four times a day (QID) | ORAL | 2 refills | Status: DC | PRN

## 2017-06-14 MED ORDER — ONDANSETRON 4 MG PO TBDP: 4.0000 mg | ORAL_TABLET | Freq: Four times a day (QID) | ORAL | 0 refills | Status: DC | PRN

## 2017-06-14 MED ORDER — PYRIDOXINE HCL 25 MG PO TABS: 25.0000 mg | ORAL_TABLET | Freq: Four times a day (QID) | ORAL | 3 refills | Status: DC | PRN

## 2017-06-14 MED ORDER — VITAMIN C 250 MG PO TABS: 250.0000 mg | ORAL_TABLET | Freq: Two times a day (BID) | ORAL | 11 refills | Status: DC

## 2017-06-14 MED ORDER — FERROUS SULFATE 325 (65 FE) MG PO TABS: 325.0000 mg | ORAL_TABLET | Freq: Two times a day (BID) | ORAL | 11 refills | Status: DC

## 2017-06-14 NOTE — Progress Notes (Signed)
Routine Prenatal Care Visit  Subjective  Michele [name] is a 32 y.o. [id number] at 697w5d being seen today for ongoing prenatal care.  She is currently monitored for the following issues for this high-risk pregnancy and has Intentional opiate overdose (HCC); Suicidal ideation; Major depressive disorder, recurrent, severe without psychotic features (HCC); Supervision of high risk pregnancy, antepartum; Restless leg syndrome; and Primary insomnia on their problem list.  ----------------------------------------------------------------------------------- Patient reports fatigue, headache, nausea, vomiting and insomnia.  She reports 2-3 hours of sleep a night. She reports constant prickly pain of her legs at night which she feels that she needs to constantly move.  Contractions: Not present. Vag. Bleeding: None.   . Denies leaking of fluid.  ----------------------------------------------------------------------------------- The following portions of the patient's history were reviewed and updated as appropriate: allergies, current medications, past family history, past medical history, past social history, past surgical history and problem list. Problem list updated.   Objective  Blood pressure 118/72, weight 229 lb (103.9 kg), last menstrual period 02/17/2017. Pregravid weight 223 lb (101.2 kg) Total Weight Gain 6 lb (2.722 kg) Urinalysis: Urine Protein: Negative Urine Glucose: Negative  Fetal Status: Fetal Heart Rate (bpm): 147         General:  Alert, oriented and cooperative. Patient is in no acute distress.  Skin: Skin is warm and dry. No rash noted.   Cardiovascular: Normal heart rate noted  Respiratory: Normal respiratory effort, no problems with respiration noted  Abdomen: Soft, gravid, appropriate for gestational age. Pain/Pressure: Absent     Pelvic:  Cervical exam deferred        Extremities: Normal range of motion.     ental Status: Normal mood and affect. Normal behavior.  Normal judgment and thought content.     Assessment   32 y.o. [id number] at 477w5d by  11/24/2017, by Last Menstrual Period presenting for routine prenatal visit  Plan   pregnancy #5 Problems (from 05/25/17 to present)    Problem Noted Resolved   Supervision of high risk pregnancy, antepartum 06/14/2017 by Natale MilchSchuman, Christanna R, MD No   Overview Signed 06/14/2017  5:05 PM by Natale MilchSchuman, Christanna R, MD      [facility] Prenatal Labs  Dating  Blood type: O/Positive/-- (03/26 1543)   Genetic Screen 1 Screen:     AFP:      Quad:      NIPS:    Antibody:Negative (03/26 1543)  Anatomic US  Rubella: 3.12 (03/26 1543) Varicella: Immune  GTT Early:        28 wk:      RPR: Non Reactive (03/26 1543)   Rhogam  HBsAg: Negative (03/26 1543)   TDaP vaccine                       HIV: Non Reactive (03/26 1543)   Flu Shot                                GBS:   Contraception  Pap:  CBB     CS/VBAC    Baby Food    Support Person                 Gestational age appropriate obstetric precautions including but not limited to vaginal bleeding, contractions, leaking of fluid and fetal movement were reviewed in detail with the patient.    Insomnia: will send for sleep study. Discussed sleep routine, no  screen time 2 hours before bed, trying for 8 hours of sleep a night, white noise machine, black out curtains, shower and clean pjs before bed, long walk or exercise in the evening, etc.   Restless leg syndrome: patient given information from up to date about restless leg syndrome treatment in pregnancy. Up to date recommends exercise and iron supplementation as first line. Discussed with patient, prescriptions sent.  Nausea and vomiting in pregnancy:  Discussed B6 QID,  unisom QHS,  and zofran PRN. Anatomy scan at next visit.    Return in about 1 month (around 07/12/2017) for ROB and [place].  Adelene Idler MD [facility], [facility] 06/14/2017, 5:08 PM

## 2017-06-14 NOTE — Progress Notes (Signed)
Pt c/o headaches for the past week with decreased appetite, weakness, insomnia and feeling "weighed down" Pt completed course of keflex for UTI and states reglan has not helped with nausea.

## 2017-06-20 ENCOUNTER — Other Ambulatory Visit: Payer: Self-pay

## 2017-06-20 ENCOUNTER — Emergency Department
Admission: EM | Admit: 2017-06-20 | Discharge: 2017-06-20 | Disposition: A | Discharge status: Home / Self Care | Payer: Medicaid Other | Attending: Emergency Medicine | Admitting: Emergency Medicine

## 2017-06-20 ENCOUNTER — Emergency Department: Payer: Medicaid Other

## 2017-06-20 DIAGNOSIS — Z79899 Other long term (current) drug therapy: Secondary | ICD-10-CM | POA: Diagnosis not present

## 2017-06-20 DIAGNOSIS — O209 Hemorrhage in early pregnancy, unspecified: Secondary | ICD-10-CM | POA: Insufficient documentation

## 2017-06-20 DIAGNOSIS — Z3A18 18 weeks gestation of pregnancy: Secondary | ICD-10-CM | POA: Insufficient documentation

## 2017-06-20 DIAGNOSIS — O4692 Antepartum hemorrhage, unspecified, second trimester: Secondary | ICD-10-CM

## 2017-06-20 DIAGNOSIS — O26852 Spotting complicating pregnancy, second trimester: Secondary | ICD-10-CM | POA: Diagnosis present

## 2017-06-20 LAB — URINALYSIS, COMPLETE (UACMP) WITH MICROSCOPIC
Bilirubin Urine: NEGATIVE
Glucose, UA: NEGATIVE mg/dL
Ketones, ur: 80 mg/dL — AB
Leukocytes, UA: NEGATIVE
Nitrite: NEGATIVE
Protein, ur: NEGATIVE mg/dL
SPECIFIC GRAVITY, URINE: 1.018 (ref 1.005–1.030)
pH: 6 (ref 5.0–8.0)

## 2017-06-20 LAB — COMPREHENSIVE METABOLIC PANEL
ALBUMIN: 3.4 g/dL — AB (ref 3.5–5.0)
ALK PHOS: 79 U/L (ref 38–126)
ALT: 15 U/L (ref 14–54)
AST: 16 U/L (ref 15–41)
Anion gap: 6 (ref 5–15)
BILIRUBIN TOTAL: 0.2 mg/dL — AB (ref 0.3–1.2)
BUN: 7 mg/dL (ref 6–20)
CALCIUM: 8.7 mg/dL — AB (ref 8.9–10.3)
CO2: 25 mmol/L (ref 22–32)
CREATININE: 0.5 mg/dL (ref 0.44–1.00)
Chloride: 105 mmol/L (ref 101–111)
GFR calc Af Amer: >60 mL/min (ref >60)
GFR calc non Af Amer: >60 mL/min (ref >60)
GLUCOSE: 81 mg/dL (ref 65–99)
Potassium: 4 mmol/L (ref 3.5–5.1)
SODIUM: 136 mmol/L (ref 135–145)
Total Protein: 6.4 g/dL — ABNORMAL LOW (ref 6.5–8.1)

## 2017-06-20 LAB — POCT PREGNANCY, URINE: Preg Test, Ur: POSITIVE — AB

## 2017-06-20 LAB — HCG, QUANTITATIVE, PREGNANCY: HCG, BETA CHAIN, QUANT, S: 17627 m[IU]/mL — AB (ref <5)

## 2017-06-20 LAB — CBC
HEMATOCRIT: 28.1 % — AB (ref 35.0–47.0)
HEMOGLOBIN: 9.7 g/dL — AB (ref 12.0–16.0)
MCH: 28.2 pg (ref 26.0–34.0)
MCHC: 34.6 g/dL (ref 32.0–36.0)
MCV: 81.6 fL (ref 80.0–100.0)
Platelets: 241 10*3/uL (ref 150–440)
RBC: 3.44 MIL/uL — AB (ref 3.80–5.20)
RDW: 13.7 % (ref 11.5–14.5)
WBC: 7.8 10*3/uL (ref 3.6–11.0)

## 2017-06-20 NOTE — ED Provider Notes (Signed)
[facility] Emergency Department Provider Note   ____________________________________________   First MD Initiated Contact with Patient 06/20/17 1718     (approximate)  I have reviewed the triage vital signs and the nursing notes.   HISTORY  Chief Complaint Back Pain and Abdominal Pain    HPI Michele [name] is a 32 y.o. female who reports that she is about 18-1/[redacted] weeks pregnant today. (She is currently 18 weeks 2 days based on her estimated due date)  Patient reports about 3 days ago she experiencing discomfort in her lower back, this morning when she got up to began experiencing vaginal spotting.  She reports she is pregnant about a little over 18 weeks.  She has had 4 previous C-sections.  She is also reporting a slight to moderate crampy discomfort in the lower abdomen.  She did have vaginal bleeding during her early part of this pregnancy, but she reports that that everything checked out again that seems to be improved.  No nausea or vomiting.  No diarrhea.  No pain or burning with urination.  No fevers.  Reports the bleeding seemed to increase slightly while she was at triage.  Started initially today this morning at about 11 AM.  She reports that she is gone through about the equivalent of 1 pad.  She has not seen any fetal parts past.  Reports she thinks the bleeding is improving now, and has not seen bleeding more than about the equivalent of 1 pad in a slight amount when wiping tissue paper after urination.  Past Medical History:  Diagnosis Date  . Anxiety   . Depression     Patient Active Problem List   Diagnosis Date Noted  . Supervision of high risk pregnancy, antepartum 06/14/2017  . Restless leg syndrome 06/14/2017  . Primary insomnia 06/14/2017  . Anemia during pregnancy in second trimester 06/14/2017  . Nausea and vomiting in pregnancy 06/14/2017  . Major depressive disorder, recurrent, severe without psychotic features (HCC)     . Intentional opiate overdose (HCC) 09/14/2014  . Suicidal ideation 09/14/2014    Past Surgical History:  Procedure Laterality Date  . CESAREAN SECTION     times 4  . KNEE SURGERY    . TONSILLECTOMY      Prior to Admission medications   Medication Sig Start Date End Date Taking? Authorizing Provider  doxylamine, Sleep, (UNISOM) 25 MG tablet Take 1 tablet (25 mg total) by mouth 4 (four) times daily as needed (nausea and vomiting). 06/14/17   Schuman, Jaquelyn Bitter, MD  ferrous sulfate (FERROUSUL) 325 (65 FE) MG tablet Take 1 tablet (325 mg total) by mouth 2 (two) times daily. 06/14/17   Schuman, Jaquelyn Bitter, MD  metoCLOPramide (REGLAN) 10 MG tablet Take 1 tablet (10 mg total) by mouth 3 (three) times daily with meals for 60 doses. Patient not taking: Reported on 06/14/2017 06/05/17 06/25/17  Cuthriell, Delorise Royals, PA-C  ondansetron (ZOFRAN ODT) 4 MG disintegrating tablet Take 1 tablet (4 mg total) by mouth every 6 (six) hours as needed for nausea. 06/14/17   Schuman, Jaquelyn Bitter, MD  phenazopyridine (PYRIDIUM) 95 MG tablet Take 1 tablet (95 mg total) by mouth 3 (three) times daily as needed for pain. Patient not taking: Reported on 06/14/2017 06/05/17   Cuthriell, Delorise Royals, PA-C  pyridOXINE (VITAMIN B-6) 25 MG tablet Take 1 tablet (25 mg total) by mouth 4 (four) times daily as needed (nausea and vomiting). 06/14/17   Natale Milch, MD  vitamin C (ASCORBIC ACID) 250 MG tablet Take 1 tablet (250 mg total) by mouth 2 (two) times daily. 06/14/17   Natale MilchSchuman, Christanna R, MD    Allergies Penicillins and Sulfa antibiotics  Family History  Problem Relation Age of Onset  . Mental illness Father   . Depression Father   . Mental illness Sister   . Schizophrenia Sister   . Depression Mother   . Cancer - Cervical Paternal Grandmother [phone]    Social History Social History   Tobacco Use  . Smoking status: Never Smoker  . Smokeless tobacco: Never Used  Substance Use Topics  . Alcohol use:  No    Comment: rare  . Drug use: No    Review of Systems Constitutional: No fever/chills Eyes: No visual changes. ENT: No sore throat. Cardiovascular: Denies chest pain. Respiratory: Denies shortness of breath. Gastrointestinal:  No nausea, no vomiting.  No diarrhea.  No constipation. Genitourinary: Negative for dysuria. Gynecology: See HPI Musculoskeletal: Negative for back pain. Skin: Negative for rash. Neurological: Negative for headaches, focal weakness or numbness.    ____________________________________________   PHYSICAL EXAM:  VITAL SIGNS: ED Triage Vitals  Enc Vitals Group     BP 06/20/17 1340 125/85     Pulse Rate 06/20/17 1340 96     Resp 06/20/17 1340 16     Temp 06/20/17 1340 98.7 F (37.1 C)     Temp Source 06/20/17 1340 Oral     SpO2 06/20/17 1340 99 %     Weight 06/20/17 1341 229 lb (103.9 kg)     Height 06/20/17 1341 5\' 4"  (1.626 m)     Head Circumference --      Peak Flow --      Pain Score 06/20/17 1340 8     Pain Loc --      Pain Edu? --      Excl. in GC? --     Constitutional: Alert and oriented. Well appearing and in no acute distress.  She is somewhat tearful, reports she is worried about the health of her baby. Eyes: Conjunctivae are normal. Head: Atraumatic. Nose: No congestion/rhinnorhea. Mouth/Throat: Mucous membranes are moist. Neck: No stridor.   Cardiovascular: Normal rate, regular rhythm. Grossly normal heart sounds.  Good peripheral circulation. Respiratory: Normal respiratory effort.  No retractions. Lungs CTAB. Gastrointestinal: Soft and nontender except for some mild tenderness reported suprapubically without rebound or guarding. No distention. Musculoskeletal: No lower extremity tenderness nor edema. Neurologic:  Normal speech and language. No gross focal neurologic deficits are appreciated.  Skin:  Skin is warm, dry and intact. No rash noted. Psychiatric: Mood and affect are normal. Speech and behavior are  normal.  ____________________________________________   LABS (all labs ordered are listed, but only abnormal results are displayed)  Labs Reviewed  HCG, QUANTITATIVE, PREGNANCY - Abnormal; Notable for the following components:      Result Value   hCG, Beta Chain, Quant, S 17,627 (*)    All other components within normal limits  CBC - Abnormal; Notable for the following components:   RBC 3.44 (*)    Hemoglobin 9.7 (*)    HCT 28.1 (*)    All other components within normal limits  COMPREHENSIVE METABOLIC PANEL - Abnormal; Notable for the following components:   Calcium 8.7 (*)    Total Protein 6.4 (*)    Albumin 3.4 (*)    Total Bilirubin 0.2 (*)    All other components within normal limits  URINALYSIS, COMPLETE (UACMP) WITH MICROSCOPIC -  Abnormal; Notable for the following components:   Color, Urine YELLOW (*)    APPearance HAZY (*)    Hgb urine dipstick LARGE (*)    Ketones, ur 80 (*)    Squamous Epithelial / LPF 0-5 (*)    All other components within normal limits  POCT PREGNANCY, URINE - Abnormal; Notable for the following components:   Preg Test, Ur POSITIVE (*)    All other components within normal limits  POC URINE PREG, ED   ____________________________________________  EKG   ____________________________________________  RADIOLOGY  Limited OB ultrasound, no acute maternal findings.  Reviewed by me.  ____________________________________________   PROCEDURES  Procedure(s) performed: None  Procedures  Critical Care performed: No  ____________________________________________   INITIAL IMPRESSION / ASSESSMENT AND PLAN / ED COURSE  Pertinent labs & imaging results that were available during my care of the patient were reviewed by me and considered in my medical decision making (see chart for details).  Patient in the second trimester reporting low back pain starting 3 days ago with vaginal bleeding today.  Previous hemoglobin 10.02 months ago.  Stable  today at 9.7.  No signs or symptoms of heavy hemorrhage.  Denies infectious symptoms.  Reassuring exam except for suprapubic tenderness.  No white count. No evidence of infection denoted by exam or history.  Clinical Course as of Jun 20 1912  Sun Jun 20, 2017  1723 18w 2 days reported. Due date estimated Sept 20 per patient.    [MQ]  1742 Pelvic exam performed with tech, Tammy.  The patient's cervix appears slightly edematous, but no heavy bleeding.  The also appears closed.  There is no large clot within the vaginal canal except for a very small amount of slightly stringy purplish clot.  No evidence of active hemorrhage.   [MQ]  1901 Page twice, continue to await callback from gynecology for disposition recommendations and follow-up.   [MQ]    Clinical Course User Index [MQ] Sharyn Creamer, MD   Discussed case with Dr. Jerene Pitch (OBGYN). Reviewed ultrasound, labs, history. No further recommendation for acute treatment at this time. Os closed. Cervix 5.5 cm. Advises follow-up with Dr. Tiburcio Pea and pelvic rest.  Fetal heart tones and ultrasound reassuring.  ----------------------------------------- 7:14 PM on 06/20/2017 -----------------------------------------  Patient resting comfortably.  Reviewed recommendations from OB/GYN.  Also discussed careful return precautions.  Patient agreeable.  Return precautions and treatment recommendations and follow-up discussed with the patient who is agreeable with the plan.   ____________________________________________   FINAL CLINICAL IMPRESSION(S) / ED DIAGNOSES  Final diagnoses:  Vaginal bleeding in pregnancy, second trimester      NEW MEDICATIONS STARTED DURING THIS VISIT:  New Prescriptions   No medications on file     Note:  This document was prepared using Dragon voice recognition software and may include unintentional dictation errors.     Sharyn Creamer, MD 06/20/17 [phone]

## 2017-06-20 NOTE — ED Notes (Signed)
Pt back to registration desk, crying states that she is bleeding even more, why can't we get her to the back or upstairs to save her baby. Pt reassurred and ob called to run her due date, pt is 18 weeks one day, pt given  A pad and placed in subwait

## 2017-06-20 NOTE — ED Triage Notes (Signed)
Pt states that she has been having back pain, reports that she is [redacted] weeks pregnant, pt states that the she thought back was related to working, but states this am she had blood in her underwear and when she wiped

## 2017-06-22 ENCOUNTER — Ambulatory Visit (INDEPENDENT_AMBULATORY_CARE_PROVIDER_SITE_OTHER): Payer: Medicaid Other | Admitting: Obstetrics and Gynecology

## 2017-06-22 ENCOUNTER — Encounter (INDEPENDENT_AMBULATORY_CARE_PROVIDER_SITE_OTHER): Payer: Self-pay

## 2017-06-22 ENCOUNTER — Other Ambulatory Visit: Payer: Self-pay | Admitting: Obstetrics and Gynecology

## 2017-06-22 VITALS — BP 126/72 | Wt 228.0 lb

## 2017-06-22 DIAGNOSIS — N760 Acute vaginitis: Secondary | ICD-10-CM

## 2017-06-22 DIAGNOSIS — O0992 Supervision of high risk pregnancy, unspecified, second trimester: Secondary | ICD-10-CM

## 2017-06-22 DIAGNOSIS — Z3A17 17 weeks gestation of pregnancy: Secondary | ICD-10-CM

## 2017-06-22 DIAGNOSIS — O23592 Infection of other part of genital tract in pregnancy, second trimester: Secondary | ICD-10-CM

## 2017-06-22 DIAGNOSIS — O4420 Partial placenta previa NOS or without hemorrhage, unspecified trimester: Secondary | ICD-10-CM

## 2017-06-22 DIAGNOSIS — O0990 Supervision of high risk pregnancy, unspecified, unspecified trimester: Secondary | ICD-10-CM

## 2017-06-22 MED ORDER — TINIDAZOLE 500 MG PO TABS: 1.0000 g | ORAL_TABLET | Freq: Every day | ORAL | 0 refills | Status: AC

## 2017-06-22 NOTE — Progress Notes (Signed)
ROB ER follow up for spotting

## 2017-06-22 NOTE — Progress Notes (Signed)
Routine Prenatal Care Visit  Subjective  Michele [name] is a 32 y.o. [id number] at 712w6d being seen today for ongoing prenatal care.  She is currently monitored for the following issues for this high-risk pregnancy and has Intentional opiate overdose (HCC); Suicidal ideation; Major depressive disorder, recurrent, severe without psychotic features (HCC); Supervision of high risk pregnancy, antepartum; Restless leg syndrome; Primary insomnia; Anemia during pregnancy in second trimester; and Nausea and vomiting in pregnancy on their problem list.  ----------------------------------------------------------------------------------- Patient reports that she experience vaginal bleeding over the weekend and was seen in the ER.  Bleeding was after rough intercoruse. She also has been working a lot and having to pick up heavy objects at work. She also reports home stressors. No further active bleeding, some pelvic discomfort similar to when she had infections in the past.   Contractions: Not present. Vag. Bleeding: None.   . Denies leaking of fluid.  ----------------------------------------------------------------------------------- The following portions of the patient's history were reviewed and updated as appropriate: allergies, current medications, past family history, past medical history, past social history, past surgical history and problem list. Problem list updated.   Objective  Blood pressure 126/72, weight 228 lb (103.4 kg), last menstrual period 02/17/2017. Pregravid weight 223 lb (101.2 kg) Total Weight Gain 5 lb (2.268 kg) Urinalysis:      Fetal Status: Fetal Heart Rate (bpm): 148         General:  Alert, oriented and cooperative. Patient is in no acute distress.  Skin: Skin is warm and dry. No rash noted.   Cardiovascular: Normal heart rate noted  Respiratory: Normal respiratory effort, no problems with respiration noted  Abdomen: Soft, gravid, appropriate for gestational age.  Pain/Pressure: Present     Pelvic:  Cervical exam performed Dilation: Closed Effacement (%): 0 Station: -3  Speculum exam showed malodorous brown discharge, no active bleeding from cervical os, os closed.   Extremities: Normal range of motion.     Mental Status: Normal mood and affect. Normal behavior. Normal judgment and thought content.   Microscopic wet-mount exam shows KOH done, clue cells, +whiff, no yeast.   Assessment   32 y.o. A5W0981G5P4004 at 792w6d by  11/24/2017, by Last Menstrual Period presenting for routine prenatal visit  Plan   pregnancy #5 Problems (from 05/25/17 to present)    Problem Noted Resolved   Supervision of high risk pregnancy, antepartum 06/14/2017 by Natale MilchSchuman, Christanna R, MD No   Overview Signed 06/14/2017  5:05 PM by Natale MilchSchuman, Christanna R, MD      Clinic [facility] Prenatal Labs  Dating  Blood type: O/Positive/-- (03/26 1543)   Genetic Screen 1 Screen:     AFP:      Quad:      NIPS:    Antibody:Negative (03/26 1543)  Anatomic US  Rubella: 3.12 (03/26 1543) Varicella: Immune  GTT Early:        28 wk:      RPR: Non Reactive (03/26 1543)   Rhogam  HBsAg: Negative (03/26 1543)   TDaP vaccine                       HIV: Non Reactive (03/26 1543)   Flu Shot                                GBS:   Contraception  Pap:  CBB     CS/VBAC    Baby  Food    Support Person             Anemia during pregnancy in second trimester 06/14/2017 by Natale Milch, MD No   Nausea and vomiting in pregnancy 06/14/2017 by Natale Milch, MD No       Gestational age appropriate obstetric precautions including but not limited to vaginal bleeding, contractions, leaking of fluid and fetal movement were reviewed in detail with the patient.    Return for ROB as scheduled.  Nuswab sent for confirmation, appears to be recurrent Bacterial Vaginosis. Prescription sent for tinidazole. Reassurance about vaginal bleeding. Advised pelvic rest. Emailed patient about marginal  previa seen on ER ultrasound report.  Follow up as planned.   Adelene Idler MD [facility], [facility] 06/22/17 5:44 PM

## 2017-06-23 ENCOUNTER — Other Ambulatory Visit: Payer: Self-pay | Admitting: Obstetrics and Gynecology

## 2017-06-23 DIAGNOSIS — N760 Acute vaginitis: Principal | ICD-10-CM

## 2017-06-23 DIAGNOSIS — B9689 Other specified bacterial agents as the cause of diseases classified elsewhere: Secondary | ICD-10-CM

## 2017-06-23 MED ORDER — CLINDAMYCIN HCL 300 MG PO CAPS: 300.0000 mg | ORAL_CAPSULE | Freq: Two times a day (BID) | ORAL | 0 refills | Status: AC

## 2017-06-23 NOTE — Progress Notes (Unsigned)
clndamy

## 2017-06-26 LAB — NUSWAB VAGINITIS PLUS (VG+)
CANDIDA ALBICANS, NAA: NEGATIVE
CANDIDA GLABRATA, NAA: NEGATIVE
CHLAMYDIA TRACHOMATIS, NAA: NEGATIVE
Neisseria gonorrhoeae, NAA: NEGATIVE
Trich vag by NAA: NEGATIVE

## 2017-06-27 NOTE — Progress Notes (Signed)
Released to mychart

## 2017-07-06 ENCOUNTER — Ambulatory Visit (INDEPENDENT_AMBULATORY_CARE_PROVIDER_SITE_OTHER): Payer: Medicaid Other | Admitting: Obstetrics & Gynecology

## 2017-07-06 VITALS — BP 130/80 | Wt 232.0 lb

## 2017-07-06 DIAGNOSIS — R319 Hematuria, unspecified: Secondary | ICD-10-CM

## 2017-07-06 DIAGNOSIS — N390 Urinary tract infection, site not specified: Secondary | ICD-10-CM

## 2017-07-06 LAB — POCT URINALYSIS DIPSTICK
Bilirubin, UA: NEGATIVE
Blood, UA: POSITIVE
Glucose, UA: NEGATIVE
KETONES UA: NEGATIVE
Nitrite, UA: POSITIVE
PH UA: 5.5 (ref 5.0–8.0)
Protein, UA: NEGATIVE
SPEC GRAV UA: 1.015 (ref 1.010–1.025)
UROBILINOGEN UA: 1 U/dL

## 2017-07-06 MED ORDER — NITROFURANTOIN MONOHYD MACRO 100 MG PO CAPS: 100.0000 mg | ORAL_CAPSULE | Freq: Two times a day (BID) | ORAL | 0 refills | Status: DC

## 2017-07-06 NOTE — Progress Notes (Signed)
  HPI:      Ms. Michele [name] is a 32 y.o. Z6X0960 who LMP was Patient's last menstrual period was 02/17/2017 (exact date)., presents today for a problem visit.    Urinary Tract Infection: Patient complains of burning with urination . She has had symptoms for 1 day. Patient also complains of no other concerns; pregnant with pos FM. Patient denies back pain, fever, headache and vaginal discharge. Patient does not have a history of recurrent UTI.  Patient does not have a history of pyelonephritis.   PMHx: She  has a past medical history of Anxiety and Depression. Also,  has a past surgical history that includes Cesarean section; Tonsillectomy; and Knee surgery., family history includes Cancer - Cervical (age of onset: 21) in her paternal grandmother; Depression in her father and mother; Mental illness in her father and sister; Schizophrenia in her sister.,  reports that she has never smoked. She has never used smokeless tobacco. She reports that she does not drink alcohol or use drugs.  She has a current medication list which includes the following prescription(s): doxylamine (sleep), ferrous sulfate, metoclopramide, nitrofurantoin (macrocrystal-monohydrate), and ondansetron. Also, is allergic to penicillins and sulfa antibiotics.  Review of Systems  Constitutional: Negative for chills, fever and malaise/fatigue.  HENT: Negative for congestion, sinus pain and sore throat.   Eyes: Negative for blurred vision and pain.  Respiratory: Negative for cough and wheezing.   Cardiovascular: Negative for chest pain and leg swelling.  Gastrointestinal: Negative for abdominal pain, constipation, diarrhea, heartburn, nausea and vomiting.  Genitourinary: Negative for dysuria, frequency, hematuria and urgency.  Musculoskeletal: Negative for back pain, joint pain, myalgias and neck pain.  Skin: Negative for itching and rash.  Neurological: Negative for dizziness, tremors and weakness.    Endo/Heme/Allergies: Does not bruise/bleed easily.  Psychiatric/Behavioral: Negative for depression. The patient is not nervous/anxious and does not have insomnia.     Objective: BP 130/80   Wt 232 lb (105.2 kg)   LMP 02/17/2017 (Exact Date) Comment: not normal  BMI 39.82 kg/m  Physical Exam  Constitutional: She is oriented to person, place, and time. She appears well-developed and well-nourished. No distress.  Musculoskeletal: Normal range of motion.  Neurological: She is alert and oriented to person, place, and time.  Skin: Skin is warm and dry.  Psychiatric: She has a normal mood and affect.  Vitals reviewed. Back - mild right flank T, no CVAT  ASSESSMENT/PLAN:   Acute cystitis  Urinary tract infection with hematuria, site unspecified    -  Primary   Relevant Medications   nitrofurantoin, macrocrystal-monohydrate, (MACROBID) 100 MG capsule Azo PRN   Other Relevant Orders   POCT urinalysis dipstick (Completed)   Urine Culture  Consideration for chronic suppressive therapy w Macrobid if has recurrence of UTIs   Michele Major, MD, [facility], [facility] 07/06/2017  3:38 PM

## 2017-07-06 NOTE — Patient Instructions (Signed)
Nitrofurantoin tablets or capsules What is this medicine? NITROFURANTOIN (nye troe fyoor AN toyn) is an antibiotic. It is used to treat urinary tract infections. This medicine may be used for other purposes; ask your health care provider or pharmacist if you have questions. COMMON BRAND NAME(S): Macrobid, Macrodantin, Urotoin What should I tell my health care provider before I take this medicine? They need to know if you have any of these conditions: -anemia -diabetes -glucose-6-phosphate dehydrogenase deficiency -kidney disease -liver disease -lung disease -other chronic illness -an unusual or allergic reaction to nitrofurantoin, other antibiotics, other medicines, foods, dyes or preservatives -pregnant or trying to get pregnant -breast-feeding How should I use this medicine? Take this medicine by mouth with a glass of water. Follow the directions on the prescription label. Take this medicine with food or milk. Take your doses at regular intervals. Do not take your medicine more often than directed. Do not stop taking except on your doctor's advice. Talk to your pediatrician regarding the use of this medicine in children. While this drug may be prescribed for selected conditions, precautions do apply. Overdosage: If you think you have taken too much of this medicine contact a poison control center or emergency room at once. NOTE: This medicine is only for you. Do not share this medicine with others. What if I miss a dose? If you miss a dose, take it as soon as you can. If it is almost time for your next dose, take only that dose. Do not take double or extra doses. What may interact with this medicine? -antacids containing magnesium trisilicate -probenecid -quinolone antibiotics like ciprofloxacin, lomefloxacin, norfloxacin and ofloxacin -sulfinpyrazone This list may not describe all possible interactions. Give your health care provider a list of all the medicines, herbs,  non-prescription drugs, or dietary supplements you use. Also tell them if you smoke, drink alcohol, or use illegal drugs. Some items may interact with your medicine. What should I watch for while using this medicine? Tell your doctor or health care professional if your symptoms do not improve or if you get new symptoms. Drink several glasses of water a day. If you are taking this medicine for a long time, visit your doctor for regular checks on your progress. If you are diabetic, you may get a false positive result for sugar in your urine with certain brands of urine tests. Check with your doctor. What side effects may I notice from receiving this medicine? Side effects that you should report to your doctor or health care professional as soon as possible: -allergic reactions like skin rash or hives, swelling of the face, lips, or tongue -chest pain -cough -difficulty breathing -dizziness, drowsiness -fever or infection -joint aches or pains -pale or blue-tinted skin -redness, blistering, peeling or loosening of the skin, including inside the mouth -tingling, burning, pain, or numbness in hands or feet -unusual bleeding or bruising -unusually weak or tired -yellowing of eyes or skin Side effects that usually do not require medical attention (report to your doctor or health care professional if they continue or are bothersome): -dark urine -diarrhea -headache -loss of appetite -nausea or vomiting -temporary hair loss This list may not describe all possible side effects. Call your doctor for medical advice about side effects. You may report side effects to FDA at 1-800-FDA-1088. Where should I keep my medicine? Keep out of the reach of children. Store at room temperature between 15 and 30 degrees C (59 and 86 degrees F). Protect from light. Throw away any unused   medicine after the expiration date. NOTE: This sheet is a summary. It may not cover all possible information. If you have  questions about this medicine, talk to your doctor, pharmacist, or health care provider.  2018 Elsevier/Gold Standard (2007-09-07 15:56:47)  

## 2017-07-08 ENCOUNTER — Other Ambulatory Visit: Payer: Self-pay | Admitting: Obstetrics & Gynecology

## 2017-07-08 ENCOUNTER — Telehealth: Payer: Self-pay

## 2017-07-08 ENCOUNTER — Encounter: Payer: Self-pay | Admitting: Obstetrics & Gynecology

## 2017-07-08 LAB — URINE CULTURE

## 2017-07-08 NOTE — Telephone Encounter (Signed)
Pt states she was seen on Tuesday by [facility] for a reoccuring UTI. He put her on Macrobid. Her S&S are getting worse even after starting the antibiotic. He told her if she didn't feel better she may have to go to hospital for IV abx since she has UTI's often. Pt aware [facility] is on call but I would send message to provider here in office this morning. CB# [phone] (W)

## 2017-07-08 NOTE — Telephone Encounter (Signed)
No alternatives.  Finish the [employer] and we can retest on Monday if still has sx's.

## 2017-07-08 NOTE — Telephone Encounter (Signed)
Can you send in a different rx? 

## 2017-07-09 NOTE — Telephone Encounter (Signed)
Spoke with patient regarding urine culture result- 10-25k mixed flora. She is having leakage of urine. Discussed need for kegel exercises and to follow up with provider at next visit.

## 2017-07-13 ENCOUNTER — Ambulatory Visit (INDEPENDENT_AMBULATORY_CARE_PROVIDER_SITE_OTHER): Payer: Medicaid Other

## 2017-07-13 ENCOUNTER — Encounter: Payer: Self-pay | Admitting: Obstetrics and Gynecology

## 2017-07-13 ENCOUNTER — Ambulatory Visit (INDEPENDENT_AMBULATORY_CARE_PROVIDER_SITE_OTHER): Payer: Medicaid Other | Admitting: Obstetrics and Gynecology

## 2017-07-13 VITALS — BP 128/88 | Wt 234.0 lb

## 2017-07-13 DIAGNOSIS — O99012 Anemia complicating pregnancy, second trimester: Secondary | ICD-10-CM

## 2017-07-13 DIAGNOSIS — O0993 Supervision of high risk pregnancy, unspecified, third trimester: Secondary | ICD-10-CM

## 2017-07-13 DIAGNOSIS — Z3A21 21 weeks gestation of pregnancy: Secondary | ICD-10-CM | POA: Diagnosis not present

## 2017-07-13 DIAGNOSIS — O0990 Supervision of high risk pregnancy, unspecified, unspecified trimester: Secondary | ICD-10-CM

## 2017-07-13 DIAGNOSIS — O0992 Supervision of high risk pregnancy, unspecified, second trimester: Secondary | ICD-10-CM | POA: Diagnosis not present

## 2017-07-13 DIAGNOSIS — K5903 Drug induced constipation: Secondary | ICD-10-CM

## 2017-07-13 MED ORDER — DOCUSATE SODIUM 100 MG PO CAPS: 100.0000 mg | ORAL_CAPSULE | Freq: Two times a day (BID) | ORAL | 2 refills | Status: DC | PRN

## 2017-07-13 NOTE — Progress Notes (Signed)
Routine Prenatal Care Visit  Subjective  Michele [name] is a 31 y.o. Z6X0960 at [redacted]w[redacted]d being seen today for ongoing prenatal care.  She is currently monitored for the following issues for this high-risk pregnancy and has Intentional opiate overdose (HCC); Suicidal ideation; Major depressive disorder, recurrent, severe without psychotic features (HCC); Supervision of high risk pregnancy, antepartum; Restless leg syndrome; Primary insomnia; and Anemia during pregnancy in second trimester on their problem list.  ----------------------------------------------------------------------------------- Patient reports no complaints.   Contractions: Not present. Vag. Bleeding: None.  Movement: Present. Denies leaking of fluid.  ----------------------------------------------------------------------------------- The following portions of the patient's history were reviewed and updated as appropriate: allergies, current medications, past family history, past medical history, past social history, past surgical history and problem list. Problem list updated.   Objective  Blood pressure 128/88, weight 234 lb (106.1 kg), last menstrual period 02/17/2017. Pregravid weight 223 lb (101.2 kg) Total Weight Gain 11 lb (4.99 kg) Urinalysis: Urine Protein: Negative Urine Glucose: Negative  Fetal Status: Fetal Heart Rate (bpm): 162   Movement: Present     General:  Alert, oriented and cooperative. Patient is in no acute distress.  Skin: Skin is warm and dry. No rash noted.   Cardiovascular: Normal heart rate noted  Respiratory: Normal respiratory effort, no problems with respiration noted  Abdomen: Soft, gravid, appropriate for gestational age. Pain/Pressure: Absent     Pelvic:  Cervical exam deferred        Extremities: Normal range of motion.     ental Status: Normal mood and affect. Normal behavior. Normal judgment and thought content.     Assessment   32 y.o. A5W0981 at [redacted]w[redacted]d by  11/24/2017, by  Last Menstrual Period presenting for routine prenatal visit  Plan   pregnancy #5 Problems (from 05/25/17 to present)    Problem Noted Resolved   Supervision of high risk pregnancy, antepartum 06/14/2017 by Natale Milch, MD No   Overview Addendum 07/13/2017  4:37 PM by Natale Milch, MD      [facility]inic [facility] Prenatal Labs  Dating LMP = 5wk [place] Blood type: O/Positive/-- (03/26 1543)   Genetic Screen    NIPS: Normal XX   Antibody:Negative (03/26 1543)  Anatomic [place] incomplete Rubella: 3.12 (03/26 1543) Varicella: Immune  GTT Early:        28 wk:      RPR: Non Reactive (03/26 1543)   Rhogam  not inducated HBsAg: Negative (03/26 1543)   TDaP vaccine                       HIV: Non Reactive (03/26 1543)   Flu Shot                                GBS:   Contraception  Pap: NIL HPV negative  CBB   Given information   CS/VBAC Repeat Cesareanx5 with BTL   Baby Food    Support Person             Anemia during pregnancy in second trimester 06/14/2017 by Natale Milch, MD No   Marginal placenta previa 06/22/2017 by Natale Milch, MD 07/13/2017 by Natale Milch, MD   Overview Signed 06/22/2017  6:03 PM by Natale Milch, MD    Pelvic rest      Nausea and vomiting in pregnancy 06/14/2017 by Natale Milch, MD 07/13/2017 by Natale Milch,  MD      Gestational age appropriate obstetric precautions including but not limited to vaginal bleeding, contractions, leaking of fluid and fetal movement were reviewed in detail with the patient.    Anatomy US incomplete today- follow up in 2 weeks Patient taking IRON TID, but causing constipation, colace prescribes Discussed repeat cesarean section and risks, reassurance about process.  Return in about 2 weeks (around 07/27/2017) for ROB.  Adelene Idler MD [facility], [facility] 07/13/17 4:40 PM

## 2017-07-21 ENCOUNTER — Telehealth: Payer: Self-pay

## 2017-07-21 NOTE — Telephone Encounter (Signed)
Received fax from [facility] for pt's referral for sleep study. They have been trying to reach patient to schedule and have been unsuccessful. Left msg for pt to call them at [phone].   Will also send MyChart message for pt to call.

## 2017-07-28 ENCOUNTER — Encounter: Payer: Self-pay | Admitting: Advanced Practice Midwife

## 2017-07-28 ENCOUNTER — Ambulatory Visit (INDEPENDENT_AMBULATORY_CARE_PROVIDER_SITE_OTHER): Payer: Medicaid Other

## 2017-07-28 ENCOUNTER — Ambulatory Visit (INDEPENDENT_AMBULATORY_CARE_PROVIDER_SITE_OTHER): Payer: Medicaid Other | Admitting: Advanced Practice Midwife

## 2017-07-28 VITALS — BP 124/80 | Wt 232.0 lb

## 2017-07-28 DIAGNOSIS — F329 Major depressive disorder, single episode, unspecified: Secondary | ICD-10-CM

## 2017-07-28 DIAGNOSIS — Z3A23 23 weeks gestation of pregnancy: Secondary | ICD-10-CM

## 2017-07-28 DIAGNOSIS — O0990 Supervision of high risk pregnancy, unspecified, unspecified trimester: Secondary | ICD-10-CM

## 2017-07-28 DIAGNOSIS — F419 Anxiety disorder, unspecified: Secondary | ICD-10-CM

## 2017-07-28 DIAGNOSIS — O99340 Other mental disorders complicating pregnancy, unspecified trimester: Secondary | ICD-10-CM

## 2017-07-28 DIAGNOSIS — Z0489 Encounter for examination and observation for other specified reasons: Secondary | ICD-10-CM

## 2017-07-28 DIAGNOSIS — Z131 Encounter for screening for diabetes mellitus: Secondary | ICD-10-CM

## 2017-07-28 DIAGNOSIS — Z130 Encounter for screening for diseases of the blood and blood-forming organs and certain disorders involving the immune mechanism: Secondary | ICD-10-CM

## 2017-07-28 DIAGNOSIS — O99342 Other mental disorders complicating pregnancy, second trimester: Secondary | ICD-10-CM | POA: Diagnosis not present

## 2017-07-28 DIAGNOSIS — O0992 Supervision of high risk pregnancy, unspecified, second trimester: Secondary | ICD-10-CM

## 2017-07-28 DIAGNOSIS — F32A Depression, unspecified: Secondary | ICD-10-CM

## 2017-07-28 DIAGNOSIS — O0993 Supervision of high risk pregnancy, unspecified, third trimester: Secondary | ICD-10-CM

## 2017-07-28 DIAGNOSIS — Z113 Encounter for screening for infections with a predominantly sexual mode of transmission: Secondary | ICD-10-CM

## 2017-07-28 DIAGNOSIS — IMO0002 Reserved for concepts with insufficient information to code with codable children: Secondary | ICD-10-CM

## 2017-07-28 MED ORDER — CITALOPRAM HYDROBROMIDE 10 MG PO TABS: 10.0000 mg | ORAL_TABLET | Freq: Every day | ORAL | 2 refills | Status: DC

## 2017-07-28 NOTE — Patient Instructions (Signed)
Perinatal Depression When a woman feels excessive sadness, anger, or anxiety during pregnancy or during the first 12 months after she gives birth, she has a condition called perinatal depression. Depression can interfere with work, school, relationships, and other everyday activities. If it is not managed properly, it can also cause problems in the mother and her baby. Sometimes, perinatal depression is left untreated because symptoms are thought to be normal mood swings during and right after pregnancy. If you have symptoms of depression, it is important to talk with your health care provider. What are the causes? The exact cause of this condition is not known. Hormonal changes during and after pregnancy may play a role in causing perinatal depression. What increases the risk? You are more likely to develop this condition if:  You have a personal or family history of depression, anxiety, or mood disorders.  You experience a stressful life event during pregnancy, such as the death of a loved one.  You have a lot of regular life stress.  You do not have support from family members or loved ones, or you are in an abusive relationship.  What are the signs or symptoms? Symptoms of this condition include:  Feeling sad or hopeless.  Feelings of guilt.  Feeling irritable or overwhelmed.  Changes in your appetite.  Lack of energy or motivation.  Sleep problems.  Difficulty concentrating or completing tasks.  Loss of interest in hobbies or relationships.  Headaches or stomach problems that do not go away.  How is this diagnosed? This condition is diagnosed based on a physical exam and mental evaluation. In some cases, your health care provider may use a depression screening tool. These tools include a list of questions that can help a health care provider diagnose depression. Your health care provider may refer you to a mental health expert who specializes in depression. How is this  treated? This condition may be treated with:  Medicines. Your health care provider will only give you medicines that have been proven safe for pregnancy and breastfeeding.  Talk therapy with a mental health professional to help change your patterns of thinking (cognitive behavioral therapy).  Support groups.  Brain stimulation or light therapies.  Stress reduction therapies, such as mindfulness.  Follow these instructions at home: Lifestyle  Do not use any products that contain nicotine or tobacco, such as cigarettes and e-cigarettes. If you need help quitting, ask your health care provider.  Do not use alcohol when you are pregnant. After your baby is born, limit alcohol intake to no more than 1 drink a day. One drink equals 12 oz of beer, 5 oz of wine, or 1 oz of hard liquor.  Consider joining a support group for new mothers. Ask your health care provider for recommendations.  Take good care of yourself. Make sure you: ? Get plenty of sleep. If you are having trouble sleeping, talk with your health care provider. ? Eat a healthy diet. This includes plenty of fruits and vegetables, whole grains, and lean proteins. ? Exercise regularly, as told by your health care provider. Ask your health care provider what exercises are safe for you. General instructions  Take over-the-counter and prescription medicines only as told by your health care provider.  Talk with your partner or family members about your feelings during pregnancy. Share any concerns or anxieties that you may have.  Ask for help with tasks or chores when you need it. Ask friends and family members to provide meals, watch your children,   or help with cleaning.  Keep all follow-up visits as told by your health care provider. This is important. Contact a health care provider if:  You (or people close to you) notice that you have any symptoms of depression.  You have depression and your symptoms get worse.  You  experience side effects from medicines, such as nausea or sleep problems. Get help right away if:  You feel like hurting yourself, your baby, or someone else. If you ever feel like you may hurt yourself or others, or have thoughts about taking your own life, get help right away. You can go to your nearest emergency department or call:  Your local emergency services (911 in the [place]  A suicide crisis helpline, such as the [facility] at 517-082-5193. This is open 24 hours a day.  Summary  Perinatal depression is when a woman feels excessive sadness, anger, or anxiety during pregnancy or during the first 12 months after she gives birth.  If perinatal depression is not treated, it can lead to health problems for the mother and her baby.  This condition is treated with medicines, talk therapy, stress reduction therapies, or a combination of two or more treatments.  Talk with your partner or family members about your feelings. Do not be afraid to ask for help. This information is not intended to replace advice given to you by your health care provider. Make sure you discuss any questions you have with your health care provider. Document Released: 04/15/2016 Document Revised: 04/15/2016 Document Reviewed: 04/15/2016 Elsevier Interactive Patient Education  2018 [place] When a woman feels excessive tension or worry (anxiety) during pregnancy or during the first 12 months after she gives birth, she has a condition called perinatal anxiety. Anxiety can interfere with work, school, relationships, and other everyday activities. If it is not managed properly, it can also cause problems in the mother and her baby.  If you are pregnant and you have symptoms of an anxiety disorder, it is important to talk with your health care provider. What are the causes? The exact cause of this condition is not known. Hormonal changes during and after pregnancy may  play a role in causing perinatal anxiety. What increases the risk? You are more likely to develop this condition if:  You have a personal or family history of depression, anxiety, or mood disorders.  You experience a stressful life event during pregnancy, such as the death of a loved one.  You have a lot of regular life stress, such as being a single parent.  You have thyroid problems.  What are the signs or symptoms? Perinatal anxiety can be different for everyone. It may include:  Panic attacks (panic disorder). These are intense episodes of fear or discomfort that may also cause sweating, nausea, shortness of breath, or fear of dying. They usually last 5-15 minutes.  Reliving an upsetting (traumatic) event through distressing thoughts, dreams, or flashbacks (post-traumatic stress disorder, or PTSD).  Excessive worry about multiple problems (generalized anxiety disorder).  Fear and stress about leaving certain people or loved ones (separation anxiety).  Performing repetitive tasks (compulsions) to relieve stress or worry (obsessive compulsive disorder, or OCD).  Fear of certain objects or situations (phobias).  Excessive worrying, such as a constant feeling that something bad is going to happen.  Inability to relax.  Difficulty concentrating.  Sleep problems.  Frequent nightmares or disturbing thoughts.  How is this diagnosed? This condition is diagnosed based on a physical  exam and mental evaluation. In some cases, your health care provider may use an anxiety screening tool. These tools include a list of questions that can help a health care provider diagnose anxiety. Your health care provider may refer you to a mental health expert who specializes in anxiety. How is this treated? This condition may be treated with:  Medicines. Your health care provider will only give you medicines that have been proven safe for pregnancy and breastfeeding.  Talk therapy with a mental  health professional to help change your patterns of thinking (cognitive behavioral therapy).  Mindfulness-based stress reduction.  Other relaxation therapies, such as deep breathing or guided muscle relaxation.  Support groups.  Follow these instructions at home: Lifestyle  Do not use any products that contain nicotine or tobacco, such as cigarettes and e-cigarettes. If you need help quitting, ask your health care provider.  Do not use alcohol when you are pregnant. After your baby is born, limit alcohol intake to no more than 1 drink a day. One drink equals 12 oz of beer, 5 oz of wine, or 1 oz of hard liquor.  Consider joining a support group for new mothers. Ask your health care provider for recommendations.  Take good care of yourself. Make sure you: ? Get plenty of sleep. If you are having trouble sleeping, talk with your health care provider. ? Eat a healthy diet. This includes plenty of fruits and vegetables, whole grains, and lean proteins. ? Exercise regularly, as told by your health care provider. Ask your health care provider what exercises are safe for you. General instructions  Take over-the-counter and prescription medicines only as told by your health care provider.  Talk with your partner or family members about your feelings during pregnancy. Share any concerns or fears that you may have.  Ask for help with tasks or chores when you need it. Ask friends and family members to provide meals, watch your children, or help with cleaning.  Keep all follow-up visits as told by your health care provider. This is important. Contact a health care provider if:  You (or people close to you) notice that you have any symptoms of anxiety or depression.  You have anxiety and your symptoms get worse.  You experience side effects from medicines, such as nausea or sleep problems. Get help right away if:  You feel like hurting yourself, your baby, or someone else. If you ever feel  like you may hurt yourself or others, or have thoughts about taking your own life, get help right away. You can go to your nearest emergency department or call:  Your local emergency services (911 in the [place]  A suicide crisis helpline, such as the [facility] at 306-401-6663. This is open 24 hours a day.  Summary  Perinatal anxiety is when a woman feels excessive tension or worry during pregnancy or during the first 12 months after she gives birth.  Perinatal anxiety may include panic attacks, post-traumatic stress disorder, separation anxiety, phobias, or generalized anxiety.  Perinatal anxiety can cause physical health problems in the mother and baby if not properly managed.  This condition is treated with medicines, talk therapy, stress reduction therapies, or a combination of two or more treatments.  Talk with your partner or family members about your concerns or fears. Do not be afraid to ask for help. This information is not intended to replace advice given to you by your health care provider. Make sure you discuss any questions you  have with your health care provider. Document Released: 04/15/2016 Document Revised: 04/15/2016 Document Reviewed: 04/15/2016 Elsevier Interactive Patient Education  [employer].

## 2017-07-28 NOTE — Addendum Note (Signed)
Addended by: Tresea Mall on: 07/28/2017 11:41 AM   Modules accepted: Orders

## 2017-07-28 NOTE — Progress Notes (Signed)
ROB Back pain Anxiety/ depression

## 2017-07-28 NOTE — Progress Notes (Addendum)
Routine Prenatal Care Visit  Subjective  Michele [name] is a 32 y.o. Z6X0960 at [redacted]w[redacted]d being seen today for ongoing prenatal care.  She is currently monitored for the following issues for this high-risk pregnancy and has Intentional opiate overdose (HCC); Suicidal ideation; Major depressive disorder, recurrent, severe without psychotic features (HCC); Supervision of high risk pregnancy, antepartum; Restless leg syndrome; Primary insomnia; and Anemia during pregnancy in second trimester on their problem list.  ----------------------------------------------------------------------------------- Patient reports depression that has gotten worse. She is having crying spells and having difficulty getting out of bed. She denies adequate sleep. She feels like she has too much going on and is requesting something to help her mood be more steady. She has also had a low back ache.   Contractions: Not present. Vag. Bleeding: None.  Movement: Present. Denies leaking of fluid.  ----------------------------------------------------------------------------------- The following portions of the patient's history were reviewed and updated as appropriate: allergies, current medications, past family history, past medical history, past social history, past surgical history and problem list. Problem list updated.   Objective  Blood pressure 124/80, weight 232 lb (105.2 kg), last menstrual period 02/17/2017. Pregravid weight 223 lb (101.2 kg) Total Weight Gain 9 lb (4.082 kg) Urinalysis: Urine Protein: Negative Urine Glucose: Negative  Fetal Status: Fetal Heart Rate (bpm): 143   Movement: Present     Follow up anatomy scan is still incomplete for LVOT and diaphragm and sub-optimal for 4 chamber heart and RVOT  General:  Alert, oriented and cooperative. Patient is in no acute distress.  Skin: Skin is warm and dry. No rash noted.   Cardiovascular: Normal heart rate noted  Respiratory: Normal respiratory effort,  no problems with respiration noted  Abdomen: Soft, gravid, appropriate for gestational age. Pain/Pressure: Absent     Pelvic:  Cervical exam deferred        Extremities: Normal range of motion.     Mental Status: Normal mood and affect. Normal behavior. Normal judgment and thought content. EPDS score today is 20   Assessment   32 y.o. G5P4004 at [redacted]w[redacted]d by  11/24/2017, by Last Menstrual Period presenting for routine prenatal visit  Plan   pregnancy #5 Problems (from 05/25/17 to present)    Problem Noted Resolved   Supervision of high risk pregnancy, antepartum 06/14/2017 by Natale Milch, MD No   Overview Addendum 07/13/2017  4:37 PM by Natale Milch, MD      Clinic [facility] Prenatal Labs  Dating LMP = 5wk [place] Blood type: O/Positive/-- (03/26 1543)   Genetic Screen    NIPS: Normal XX   Antibody:Negative (03/26 1543)  Anatomic [place] incomplete Rubella: 3.12 (03/26 1543) Varicella: Immune  GTT Early:        28 wk:      RPR: Non Reactive (03/26 1543)   Rhogam  not inducated HBsAg: Negative (03/26 1543)   TDaP vaccine                       HIV: Non Reactive (03/26 1543)   Flu Shot                                GBS:   Contraception  Pap: NIL HPV negative  CBB   Given information   CS/VBAC Repeat Cesareanx5 with BTL   Baby Food    Support Person             Anemia  during pregnancy in second trimester 06/14/2017 by Natale Milch, MD No   Marginal placenta previa 06/22/2017 by Natale Milch, MD 07/13/2017 by Natale Milch, MD   Overview Signed 06/22/2017  6:03 PM by Natale Milch, MD    Pelvic rest      Nausea and vomiting in pregnancy 06/14/2017 by Natale Milch, MD 07/13/2017 by Natale Milch, MD       Preterm labor symptoms and general obstetric precautions including but not limited to vaginal bleeding, contractions, leaking of fluid and fetal movement were reviewed in detail with the patient. Please refer to After Visit  Summary for other counseling recommendations.   Return in about 1 month (around 08/25/2017) for f/u anatomy, 28 week labs and rob.  Tresea Mall, CNM 07/28/2017 11:39 AM

## 2017-08-02 ENCOUNTER — Other Ambulatory Visit: Payer: Self-pay | Admitting: Obstetrics and Gynecology

## 2017-08-02 DIAGNOSIS — O0992 Supervision of high risk pregnancy, unspecified, second trimester: Secondary | ICD-10-CM

## 2017-08-02 NOTE — Progress Notes (Signed)
Called and left message, released to mychart, would like to follow up with MFM to complete imaging.

## 2017-08-02 NOTE — Progress Notes (Signed)
Called and left message with patient, will obtain follow up ultrasounds with MFM for diaphragm and LVOT.  Adelene Idlerhristanna Kaya Klausing MD [facility], [facility] 08/02/17 3:11 PM

## 2017-08-04 ENCOUNTER — Other Ambulatory Visit: Payer: Self-pay | Admitting: Obstetrics and Gynecology

## 2017-08-04 DIAGNOSIS — O0990 Supervision of high risk pregnancy, unspecified, unspecified trimester: Secondary | ICD-10-CM

## 2017-08-19 ENCOUNTER — Other Ambulatory Visit: Payer: Self-pay | Admitting: *Deleted

## 2017-08-19 DIAGNOSIS — O0990 Supervision of high risk pregnancy, unspecified, unspecified trimester: Secondary | ICD-10-CM

## 2017-08-19 DIAGNOSIS — T40602A Poisoning by unspecified narcotics, intentional self-harm, initial encounter: Secondary | ICD-10-CM

## 2017-08-19 DIAGNOSIS — O99012 Anemia complicating pregnancy, second trimester: Secondary | ICD-10-CM

## 2017-08-23 ENCOUNTER — Ambulatory Visit
Admission: RE | Admit: 2017-08-23 | Discharge: 2017-08-23 | Disposition: A | Discharge status: Home / Self Care | Payer: Medicaid Other | Source: Ambulatory Visit | Attending: Obstetrics & Gynecology | Admitting: Obstetrics & Gynecology

## 2017-08-23 DIAGNOSIS — O99012 Anemia complicating pregnancy, second trimester: Secondary | ICD-10-CM | POA: Diagnosis not present

## 2017-08-23 DIAGNOSIS — T40602A Poisoning by unspecified narcotics, intentional self-harm, initial encounter: Secondary | ICD-10-CM | POA: Diagnosis not present

## 2017-08-23 DIAGNOSIS — O0992 Supervision of high risk pregnancy, unspecified, second trimester: Secondary | ICD-10-CM | POA: Insufficient documentation

## 2017-08-23 DIAGNOSIS — Z3A26 26 weeks gestation of pregnancy: Secondary | ICD-10-CM | POA: Diagnosis not present

## 2017-08-23 DIAGNOSIS — O0990 Supervision of high risk pregnancy, unspecified, unspecified trimester: Secondary | ICD-10-CM | POA: Diagnosis present

## 2017-08-23 DIAGNOSIS — O9A212 Injury, poisoning and certain other consequences of external causes complicating pregnancy, second trimester: Secondary | ICD-10-CM | POA: Insufficient documentation

## 2017-08-25 ENCOUNTER — Encounter: Payer: Medicaid Other | Admitting: Maternal Newborn

## 2017-08-25 ENCOUNTER — Other Ambulatory Visit: Payer: Medicaid Other

## 2017-08-30 ENCOUNTER — Other Ambulatory Visit: Payer: Medicaid Other

## 2017-08-30 ENCOUNTER — Ambulatory Visit (INDEPENDENT_AMBULATORY_CARE_PROVIDER_SITE_OTHER): Payer: Medicaid Other | Admitting: Certified Nurse Midwife

## 2017-08-30 VITALS — BP 104/64 | Wt 232.0 lb

## 2017-08-30 DIAGNOSIS — Z3A27 27 weeks gestation of pregnancy: Secondary | ICD-10-CM

## 2017-08-30 DIAGNOSIS — Z131 Encounter for screening for diabetes mellitus: Secondary | ICD-10-CM

## 2017-08-30 DIAGNOSIS — O0990 Supervision of high risk pregnancy, unspecified, unspecified trimester: Secondary | ICD-10-CM

## 2017-08-30 DIAGNOSIS — O99210 Obesity complicating pregnancy, unspecified trimester: Secondary | ICD-10-CM

## 2017-08-30 DIAGNOSIS — O34219 Maternal care for unspecified type scar from previous cesarean delivery: Secondary | ICD-10-CM

## 2017-08-30 DIAGNOSIS — Z113 Encounter for screening for infections with a predominantly sexual mode of transmission: Secondary | ICD-10-CM

## 2017-08-30 DIAGNOSIS — Z130 Encounter for screening for diseases of the blood and blood-forming organs and certain disorders involving the immune mechanism: Secondary | ICD-10-CM

## 2017-08-30 DIAGNOSIS — O26843 Uterine size-date discrepancy, third trimester: Secondary | ICD-10-CM

## 2017-08-30 NOTE — Progress Notes (Signed)
Pt c/o headaches on and off and feeling dizzy. 28 wk labs today.

## 2017-08-31 LAB — 28 WEEK RH+PANEL
BASOS ABS: 0 10*3/uL (ref 0.0–0.2)
Basos: 0 %
EOS (ABSOLUTE): 0.1 10*3/uL (ref 0.0–0.4)
EOS: 1 %
Gestational Diabetes Screen: 116 mg/dL (ref 65–139)
HIV SCREEN 4TH GENERATION: NONREACTIVE
Hematocrit: 25.8 % — ABNORMAL LOW (ref 34.0–46.6)
Hemoglobin: 8.4 g/dL — ABNORMAL LOW (ref 11.1–15.9)
IMMATURE GRANS (ABS): 0.1 10*3/uL (ref 0.0–0.1)
IMMATURE GRANULOCYTES: 1 %
Lymphocytes Absolute: 1.7 10*3/uL (ref 0.7–3.1)
Lymphs: 19 %
MCH: 27.1 pg (ref 26.6–33.0)
MCHC: 32.6 g/dL (ref 31.5–35.7)
MCV: 83 fL (ref 79–97)
MONOCYTES: 4 %
Monocytes Absolute: 0.3 10*3/uL (ref 0.1–0.9)
NEUTROS PCT: 75 %
Neutrophils Absolute: 6.8 10*3/uL (ref 1.4–7.0)
PLATELETS: 251 10*3/uL (ref 150–450)
RBC: 3.1 x10E6/uL — ABNORMAL LOW (ref 3.77–5.28)
RDW: 13.3 % (ref 12.3–15.4)
RPR Ser Ql: NONREACTIVE
WBC: 9 10*3/uL (ref 3.4–10.8)

## 2017-09-01 ENCOUNTER — Encounter: Payer: Self-pay | Admitting: Advanced Practice Midwife

## 2017-09-03 ENCOUNTER — Telehealth: Payer: Self-pay

## 2017-09-03 ENCOUNTER — Other Ambulatory Visit: Payer: Self-pay | Admitting: Advanced Practice Midwife

## 2017-09-03 DIAGNOSIS — D509 Iron deficiency anemia, unspecified: Secondary | ICD-10-CM

## 2017-09-03 DIAGNOSIS — O99019 Anemia complicating pregnancy, unspecified trimester: Principal | ICD-10-CM

## 2017-09-03 NOTE — Telephone Encounter (Signed)
Spoke with patient and sent referral to hematology

## 2017-09-03 NOTE — Telephone Encounter (Signed)
Pt aware.

## 2017-09-03 NOTE — Progress Notes (Signed)
Referral sent for hematology/iron deficiency anemia

## 2017-09-03 NOTE — Telephone Encounter (Signed)
Pt calling to see if she can set up the iron transfusion? Please advise

## 2017-09-03 NOTE — Telephone Encounter (Signed)
Sent referral for hematology

## 2017-09-05 ENCOUNTER — Encounter: Payer: Self-pay | Admitting: Certified Nurse Midwife

## 2017-09-05 DIAGNOSIS — O34219 Maternal care for unspecified type scar from previous cesarean delivery: Secondary | ICD-10-CM | POA: Insufficient documentation

## 2017-09-05 NOTE — Progress Notes (Signed)
HROB at 27wk5d: Baby active. O POS blood tye. 28 week labs today. [facility] 26 weeks was normal. Low lying placeta resolved. Posterior placenta. EFW in the 48th%. 30 day papers signed for BTL. Discussed permanence, risks of failure of 1:300, risks of tubal pregnancy if she does become pregnant, and risks of bleeding after surgery ROB/ growth scan in 2 weeks-to schedule repeat CS next visit. Farrel Connersolleen Jahrell Hamor, CNM

## 2017-09-13 ENCOUNTER — Encounter: Payer: Self-pay | Admitting: Obstetrics and Gynecology

## 2017-09-13 ENCOUNTER — Telehealth: Payer: Self-pay | Admitting: Obstetrics and Gynecology

## 2017-09-13 ENCOUNTER — Ambulatory Visit (INDEPENDENT_AMBULATORY_CARE_PROVIDER_SITE_OTHER): Payer: Medicaid Other

## 2017-09-13 ENCOUNTER — Ambulatory Visit (INDEPENDENT_AMBULATORY_CARE_PROVIDER_SITE_OTHER): Payer: Medicaid Other | Admitting: Obstetrics and Gynecology

## 2017-09-13 ENCOUNTER — Encounter: Payer: Self-pay | Admitting: Oncology

## 2017-09-13 VITALS — BP 118/64 | Wt 237.0 lb

## 2017-09-13 DIAGNOSIS — O0990 Supervision of high risk pregnancy, unspecified, unspecified trimester: Secondary | ICD-10-CM

## 2017-09-13 DIAGNOSIS — O99213 Obesity complicating pregnancy, third trimester: Secondary | ICD-10-CM

## 2017-09-13 DIAGNOSIS — Z3A29 29 weeks gestation of pregnancy: Secondary | ICD-10-CM

## 2017-09-13 DIAGNOSIS — O26843 Uterine size-date discrepancy, third trimester: Secondary | ICD-10-CM | POA: Diagnosis not present

## 2017-09-13 DIAGNOSIS — O99013 Anemia complicating pregnancy, third trimester: Secondary | ICD-10-CM

## 2017-09-13 DIAGNOSIS — O34219 Maternal care for unspecified type scar from previous cesarean delivery: Secondary | ICD-10-CM

## 2017-09-13 DIAGNOSIS — O99210 Obesity complicating pregnancy, unspecified trimester: Secondary | ICD-10-CM

## 2017-09-13 NOTE — Telephone Encounter (Signed)
Patient is aware [facility]17[facility]17 @ 8:10am, Pre-admit Testing afterwards, and OR on 11/18/17.

## 2017-09-13 NOTE — Telephone Encounter (Signed)
Thank you :)

## 2017-09-13 NOTE — Progress Notes (Signed)
Routine Prenatal Care Visit  Subjective  Michele [name] is a 32 y.o. Z6X0960G5P4004 at 5542w5d being seen today for ongoing prenatal care.  She is currently monitored for the following issues for this high-risk pregnancy and has Intentional opiate overdose (HCC); Suicidal ideation; Major depressive disorder, recurrent, severe without psychotic features (HCC); Supervision of high risk pregnancy, antepartum; Restless leg syndrome; Primary insomnia; Anemia during pregnancy in second trimester; and Previous cesarean section complicating pregnancy on their problem list.  ----------------------------------------------------------------------------------- Patient reports pain along her sternum and ribcage, tender to palpation on bone..   Contractions: Not present. Vag. Bleeding: None.  Movement: Present. Denies leaking of fluid.  ----------------------------------------------------------------------------------- The following portions of the patient's history were reviewed and updated as appropriate: allergies, current medications, past family history, past medical history, past social history, past surgical history and problem list. Problem list updated.   Objective  Blood pressure 118/64, weight 237 lb (107.5 kg), last menstrual period 02/17/2017. Pregravid weight 223 lb (101.2 kg) Total Weight Gain 14 lb (6.35 kg) Urinalysis: Urine Protein: Negative Urine Glucose: Negative  Fetal Status: Fetal Heart Rate (bpm): 138 Fundal Height: 40 cm Movement: Present  Presentation: Vertex  General:  Alert, oriented and cooperative. Patient is in no acute distress.  Skin: Skin is warm and dry. No rash noted.   Cardiovascular: Normal heart rate noted  Respiratory: Normal respiratory effort, no problems with respiration noted  Abdomen: Soft, gravid, appropriate for gestational age. Pain/Pressure: Absent     Pelvic:  Cervical exam deferred        Extremities: Normal range of motion.     ental Status: Normal  mood and affect. Normal behavior. Normal judgment and thought content.     Assessment   32 y.o. [id number] at 2942w5d by  11/24/2017, by Last Menstrual Period presenting for routine prenatal visit  Plan   pregnancy #5 Problems (from 05/25/17 to present)    Problem Noted Resolved   Supervision of high risk pregnancy, antepartum 06/14/2017 by Natale MilchSchuman, Jailynn Lavalais R, MD No   Overview Addendum 09/13/2017  9:39 AM by Natale MilchSchuman, Artist Bloom R, MD      Clinic [facility] Prenatal Labs  Dating LMP = 5wk US Blood type: O/Positive/-- (03/26 1543)   Genetic Screen    NIPS: Normal XX   Antibody:Negative (03/26 1543)  Anatomic US complete Rubella: 3.12 (03/26 1543) Varicella: Immune  GTT Early:        28 wk: 116      RPR: Non Reactive (03/26 1543)   Rhogam  not inducated HBsAg: Negative (03/26 1543)   TDaP vaccine                       HIV: Non Reactive (03/26 1543)   Flu Shot                                GBS:   Contraception  BTL Pap: NIL HPV negative  CBB   Given information   CS/VBAC Repeat Cesareanx5 with BTL   Baby Food    Support Person             Anemia during pregnancy in second trimester 06/14/2017 by Natale MilchSchuman, Fard Borunda R, MD No   Marginal placenta previa 06/22/2017 by Natale MilchSchuman, Eddrick Dilone R, MD 07/13/2017 by Natale MilchSchuman, Ardenia Stiner R, MD   Overview Signed 06/22/2017  6:03 PM by Natale MilchSchuman, Alexsis Branscom R, MD    Pelvic rest  Nausea and vomiting in pregnancy 06/14/2017 by Natale Milch, MD 07/13/2017 by Natale Milch, MD       Gestational age appropriate obstetric precautions including but not limited to vaginal bleeding, contractions, leaking of fluid and fetal movement were reviewed in detail with the patient.    Repeat cesarean section with bilateral tubal ligation scheduled for 11/18/17 Discussed warm showers and tylenol for pain.  Appointment with hematology next Monday. Will order anemia panel today. She has been taking an iron supplement and tried to add iron rich foods to  her diet such as liver.  Return in about 2 weeks (around 09/27/2017) for ROB.  Adelene Idler MD [facility], [facility] 09/13/17 10:00 AM

## 2017-09-13 NOTE — Progress Notes (Signed)
Growth scan today. C/o of LUQ Abd pn/soreness since last night. Sore to touch up into chest/left breast.

## 2017-09-14 LAB — FERRITIN: Ferritin: 10 ng/mL — ABNORMAL LOW (ref 15–150)

## 2017-09-14 LAB — VITAMIN B12: Vitamin B-12: 244 pg/mL (ref 232–1245)

## 2017-09-14 LAB — FOLATE: Folate: 19.2 ng/mL (ref >3.0)

## 2017-09-14 LAB — IRON AND TIBC
IRON SATURATION: 11 % — AB (ref 15–55)
Iron: 44 ug/dL (ref 27–159)
TIBC: 405 ug/dL (ref 250–450)
UIBC: 361 ug/dL (ref 131–425)

## 2017-09-14 NOTE — Progress Notes (Signed)
Patient following up with hematology. [facility]

## 2017-09-20 ENCOUNTER — Inpatient Hospital Stay: Payer: Medicaid Other | Attending: Oncology | Admitting: Oncology

## 2017-09-20 ENCOUNTER — Encounter: Payer: Self-pay | Admitting: Oncology

## 2017-09-20 ENCOUNTER — Other Ambulatory Visit: Payer: Self-pay

## 2017-09-20 VITALS — BP 117/77 | HR 105 | Temp 97.5°F | Resp 12 | Ht 64.0 in | Wt 233.6 lb

## 2017-09-20 DIAGNOSIS — F418 Other specified anxiety disorders: Secondary | ICD-10-CM | POA: Insufficient documentation

## 2017-09-20 DIAGNOSIS — D509 Iron deficiency anemia, unspecified: Secondary | ICD-10-CM | POA: Diagnosis present

## 2017-09-20 DIAGNOSIS — Z79899 Other long term (current) drug therapy: Secondary | ICD-10-CM | POA: Insufficient documentation

## 2017-09-20 DIAGNOSIS — Z331 Pregnant state, incidental: Secondary | ICD-10-CM | POA: Diagnosis not present

## 2017-09-20 DIAGNOSIS — Z8542 Personal history of malignant neoplasm of other parts of uterus: Secondary | ICD-10-CM | POA: Insufficient documentation

## 2017-09-20 DIAGNOSIS — R3915 Urgency of urination: Secondary | ICD-10-CM | POA: Diagnosis not present

## 2017-09-20 DIAGNOSIS — O99013 Anemia complicating pregnancy, third trimester: Secondary | ICD-10-CM

## 2017-09-20 NOTE — Progress Notes (Signed)
Patient here for initial visit. She is feeling  Extremely tired.

## 2017-09-20 NOTE — Progress Notes (Signed)
[facility]  [phone] [phone]  ID: Michele [name] OB: [birth year]  MR#: [id number]  AOZ#:[id number]#:[id number]  Patient Care Team: Patient, No Pcp Per as PCP - General (General Practice)  CHIEF COMPLAINT: Anemia during pregnancy in the third trimester.  INTERVAL HISTORY: Patient is a 32 year old female with her third trimester pregnancy have significantly decreased hemoglobin as well as iron stores on routine blood work.  She currently feels well and is asymptomatic.  She does not complain of weakness or fatigue today.  She has no neurologic complaints.  She denies any recent fevers or illnesses.  She is gaining weight appropriately.  She has no chest pain or shortness of breath.  She denies any nausea, vomiting, constipation, or diarrhea.  She has no urinary complaints.  Patient offers no specific complaints today.  REVIEW OF SYSTEMS:   Review of Systems  Constitutional: Negative.  Negative for fever, malaise/fatigue and weight loss.  Respiratory: Negative.  Negative for cough and shortness of breath.   Cardiovascular: Negative.  Negative for chest pain and leg swelling.  Gastrointestinal: Negative.  Negative for abdominal pain, blood in stool, melena, nausea and vomiting.  Genitourinary: Negative.  Negative for hematuria.  Musculoskeletal: Negative.  Negative for back pain.  Skin: Negative.  Negative for rash.  Neurological: Negative.  Negative for sensory change, focal weakness, weakness and headaches.  Psychiatric/Behavioral: Negative.  The patient is not nervous/anxious.     As per HPI. Otherwise, a complete review of systems is negative.  PAST MEDICAL HISTORY: Past Medical History:  Diagnosis Date  . Anemia during pregnancy in second trimester 06/14/2017  . Anxiety   . Depression     PAST SURGICAL HISTORY: Past Surgical History:  Procedure Laterality Date  . CESAREAN SECTION     times 4  . KNEE SURGERY    . TONSILLECTOMY       FAMILY HISTORY: Family History  Problem Relation Age of Onset  . Mental illness Father   . Depression Father   . Mental illness Sister   . Schizophrenia Sister   . Depression Mother   . Cancer - Cervical Paternal Grandmother [phone]    ADVANCED DIRECTIVES (Y/N):  N  HEALTH MAINTENANCE: Social History   Tobacco Use  . Smoking status: Never Smoker  . Smokeless tobacco: Never Used  Substance Use Topics  . Alcohol use: No    Comment: rare  . Drug use: No     Colonoscopy:  PAP:  Bone density:  Lipid panel:  Allergies  Allergen Reactions  . Penicillins Hives  . Sulfa Antibiotics Hives    Current Outpatient Medications  Medication Sig Dispense Refill  . citalopram (CELEXA) 10 MG tablet Take 1 tablet (10 mg total) by mouth daily. 30 tablet 2  . ferrous sulfate (FERROUSUL) 325 (65 FE) MG tablet Take 1 tablet (325 mg total) by mouth 2 (two) times daily. 60 tablet 11  . Prenatal Vit-Fe Fumarate-FA (MULTIVITAMIN-PRENATAL) 27-0.8 MG TABS tablet Take 1 tablet by mouth daily at 12 noon.     No current facility-administered medications for this visit.     OBJECTIVE: Vitals:   09/20/17 1459 09/20/17 1506  BP:  117/77  Pulse:  (!) 105  Resp: 12   Temp:  (!) 97.5 F (36.4 C)     Body mass index is 40.1 kg/m.    ECOG FS:0 - Asymptomatic  General: Well-developed, well-nourished, no acute distress. Eyes: Pink conjunctiva, anicteric sclera. HEENT: Normocephalic, moist mucous membranes, clear oropharnyx. Lungs: Clear  to auscultation bilaterally. Heart: Regular rate and rhythm. No rubs, murmurs, or gallops. Abdomen: Appears appropriate for gestational age. Musculoskeletal: No edema, cyanosis, or clubbing. Neuro: Alert, answering all questions appropriately. Cranial nerves grossly intact. Skin: No rashes or petechiae noted. Psych: Normal affect.  LAB RESULTS:  Lab Results  Component Value Date   NA 136 06/20/2017   K 4.0 06/20/2017   CL 105 06/20/2017   CO2 25  06/20/2017   GLUCOSE 81 06/20/2017   BUN 7 06/20/2017   CREATININE 0.50 06/20/2017   CALCIUM 8.7 (L) 06/20/2017   PROT 6.4 (L) 06/20/2017   ALBUMIN 3.4 (L) 06/20/2017   AST 16 06/20/2017   ALT 15 06/20/2017   ALKPHOS 79 06/20/2017   BILITOT 0.2 (L) 06/20/2017   GFRNONAA >60 06/20/2017   GFRAA >60 06/20/2017    Lab Results  Component Value Date   WBC 9.0 08/30/2017   NEUTROABS 6.8 08/30/2017   HGB 8.4 (L) 08/30/2017   HCT 25.8 (L) 08/30/2017   MCV 83 08/30/2017   PLT 251 08/30/2017   Lab Results  Component Value Date   IRON 44 09/13/2017   TIBC 405 09/13/2017   IRONPCTSAT 11 (L) 09/13/2017   Lab Results  Component Value Date   FERRITIN 10 (L) 09/13/2017      STUDIES: US Ob Follow Up  Result Date: 09/13/2017 ULTRASOUND REPORT Patient Name: Michele [name] DOB: 07/22/1985 MRN: [id number] Location: [facility] Date of Service: 09/13/2017 Indications:growth/afi Findings: Mason Jim intrauterine pregnancy is visualized with FHR at 148 BPM. Biometrics give an (U/S) Gestational age of [redacted]w[redacted]d and an (U/S) EDD of 11/21/2017; this correlates with the clinically established Estimated Date of Delivery: 11/24/17. Fetal presentation is Cephalic. Placenta: Posterior. AFI: 20.22 cm Growth percentile is 51st. EFW: 3lbs 3oz/ 1443g Impression: 1. [redacted]w[redacted]d Viable Singleton Intrauterine pregnancy previously established criteria. 2. Growth is 51st %ile.  AFI is 20.22 cm. Recommendations: 1.Clinical correlation with the patient's History and Physical Exam. Willette Alma, RDMS, RVT I have reviewed this ultrasound and the report. I agree with the above assessment and plan. Adelene Idler MD [facility] 09/13/17 9:31 AM     ASSESSMENT: Anemia during pregnancy in the third trimester.  PLAN:    1. Anemia during pregnancy in the third trimester: Patient's hemoglobin and iron stores are significantly reduced.  Patient states she takes oral iron supplementation  along with her prenatal vitamin.  She will benefit from IV iron and will return to clinic in 1 and 2 weeks to receive 510 mg IV Feraheme.  Patient will then return to clinic the week of November 08, 2017 1 week prior to her scheduled C-section for repeat laboratory work, further evaluation, and consideration of additional IV iron. 2.  Urgency: Patient reports that she has a scheduled C-section in mid September.  I spent a total of 45 minutes face-to-face with the patient of which greater than 50% of the visit was spent in counseling and coordination of care as detailed above.   Patient expressed understanding and was in agreement with this plan. She also understands that She can call clinic at any time with any questions, concerns, or complaints.   Cancer Staging No matching staging information was found for the patient.  Jeralyn Ruths, MD   09/22/2017 11:13 PM

## 2017-09-24 ENCOUNTER — Inpatient Hospital Stay: Payer: Medicaid Other

## 2017-09-24 VITALS — BP 98/64 | HR 90 | Temp 96.3°F | Resp 18

## 2017-09-24 DIAGNOSIS — D509 Iron deficiency anemia, unspecified: Secondary | ICD-10-CM | POA: Diagnosis not present

## 2017-09-24 DIAGNOSIS — O99013 Anemia complicating pregnancy, third trimester: Secondary | ICD-10-CM

## 2017-09-24 MED ORDER — SODIUM CHLORIDE 0.9 % IV SOLN
510.0000 mg | Freq: Once | INTRAVENOUS | Status: AC
  Administered 2017-09-24: 510 mg via INTRAVENOUS
  Filled 2017-09-24: qty 17

## 2017-09-24 MED ORDER — SODIUM CHLORIDE 0.9 % IV SOLN
INTRAVENOUS | Status: DC
  Administered 2017-09-24: 12:00:00 via INTRAVENOUS
  Filled 2017-09-24: qty 1000

## 2017-09-27 ENCOUNTER — Encounter: Payer: Self-pay | Admitting: Certified Nurse Midwife

## 2017-09-27 ENCOUNTER — Ambulatory Visit (INDEPENDENT_AMBULATORY_CARE_PROVIDER_SITE_OTHER): Payer: Medicaid Other | Admitting: Certified Nurse Midwife

## 2017-09-27 VITALS — BP 102/64 | Wt 236.0 lb

## 2017-09-27 DIAGNOSIS — Z3A31 31 weeks gestation of pregnancy: Secondary | ICD-10-CM

## 2017-09-27 DIAGNOSIS — O99213 Obesity complicating pregnancy, third trimester: Secondary | ICD-10-CM

## 2017-09-27 DIAGNOSIS — Z23 Encounter for immunization: Secondary | ICD-10-CM | POA: Diagnosis not present

## 2017-09-27 DIAGNOSIS — O34219 Maternal care for unspecified type scar from previous cesarean delivery: Secondary | ICD-10-CM

## 2017-09-27 DIAGNOSIS — O0992 Supervision of high risk pregnancy, unspecified, second trimester: Secondary | ICD-10-CM

## 2017-09-27 DIAGNOSIS — O99210 Obesity complicating pregnancy, unspecified trimester: Secondary | ICD-10-CM

## 2017-09-27 NOTE — Progress Notes (Signed)
ROB No concerns Tdap/Blood consent today

## 2017-09-30 NOTE — Progress Notes (Signed)
HROB at 31wk5days: Hx of previous CS x 4. Has repeat CS and BTL scheduled for 11/18/2017 Growing increasingly uncomfortable-difficulty sleeping\ Depression: Stopped taking the Celexa after 1 week. Did not like the way it made her feel She feels like her mood is stable H&H at 28 weeks: 8.4gm/dl & 78.2%25.8%. Seen at hematology ROB/ growth scan  in 2 weeks (BMI>40) TDAP and BT consent today.  Michele [name], CNM

## 2017-10-01 ENCOUNTER — Inpatient Hospital Stay: Payer: Medicaid Other | Attending: Oncology

## 2017-10-11 ENCOUNTER — Encounter: Payer: Medicaid Other | Admitting: Maternal Newborn

## 2017-10-11 ENCOUNTER — Other Ambulatory Visit: Payer: Medicaid Other

## 2017-10-14 ENCOUNTER — Ambulatory Visit (INDEPENDENT_AMBULATORY_CARE_PROVIDER_SITE_OTHER): Payer: Medicaid Other

## 2017-10-14 ENCOUNTER — Ambulatory Visit (INDEPENDENT_AMBULATORY_CARE_PROVIDER_SITE_OTHER): Payer: Medicaid Other | Admitting: Obstetrics and Gynecology

## 2017-10-14 VITALS — BP 128/58 | Wt 232.0 lb

## 2017-10-14 DIAGNOSIS — O99213 Obesity complicating pregnancy, third trimester: Secondary | ICD-10-CM

## 2017-10-14 DIAGNOSIS — Z3A34 34 weeks gestation of pregnancy: Secondary | ICD-10-CM

## 2017-10-14 DIAGNOSIS — O26843 Uterine size-date discrepancy, third trimester: Secondary | ICD-10-CM

## 2017-10-14 DIAGNOSIS — O0992 Supervision of high risk pregnancy, unspecified, second trimester: Secondary | ICD-10-CM

## 2017-10-14 DIAGNOSIS — O99210 Obesity complicating pregnancy, unspecified trimester: Secondary | ICD-10-CM

## 2017-10-14 DIAGNOSIS — O0993 Supervision of high risk pregnancy, unspecified, third trimester: Secondary | ICD-10-CM

## 2017-10-14 DIAGNOSIS — D509 Iron deficiency anemia, unspecified: Secondary | ICD-10-CM

## 2017-10-14 DIAGNOSIS — O99019 Anemia complicating pregnancy, unspecified trimester: Secondary | ICD-10-CM

## 2017-10-14 DIAGNOSIS — O34219 Maternal care for unspecified type scar from previous cesarean delivery: Secondary | ICD-10-CM

## 2017-10-14 DIAGNOSIS — O99013 Anemia complicating pregnancy, third trimester: Secondary | ICD-10-CM

## 2017-10-14 NOTE — Progress Notes (Signed)
Routine Prenatal Care Visit  Subjective  Duwayne HeckDanielle Geralynn OchsMarie O [name] is a 32 y.o. [id number] at 5946w1d being seen today for ongoing prenatal care.  She is currently monitored for the following issues for this high-risk pregnancy and has Intentional opiate overdose (HCC); Suicidal ideation; Major depressive disorder, recurrent, severe without psychotic features (HCC); Supervision of high risk pregnancy, antepartum; Restless leg syndrome; Primary insomnia; Anemia during pregnancy in third trimester; and Previous cesarean section complicating pregnancy on their problem list.  ----------------------------------------------------------------------------------- Patient reports swelling of bilateral extremities, worse at night. Pain in left knee with popping. She swa hematology and received one iron transfusion so far.   Contractions: Not present. Vag. Bleeding: None.  Movement: Present. Denies leaking of fluid.  ----------------------------------------------------------------------------------- The following portions of the patient's history were reviewed and updated as appropriate: allergies, current medications, past family history, past medical history, past social history, past surgical history and problem list. Problem list updated.   Objective  Blood pressure (!) 128/58, weight 232 lb (105.2 kg), last menstrual period 02/17/2017, unknown if currently breastfeeding. Pregravid weight 223 lb (101.2 kg) Total Weight Gain 9 lb (4.082 kg) Urinalysis:      Fetal Status: Fetal Heart Rate (bpm): 134 Fundal Height: 40 cm Movement: Present  Presentation: Vertex  General:  Alert, oriented and cooperative. Patient is in no acute distress.  Skin: Skin is warm and dry. No rash noted.   Cardiovascular: Normal heart rate noted  Respiratory: Normal respiratory effort, no problems with respiration noted  Abdomen: Soft, gravid, appropriate for gestational age. Pain/Pressure: Present     Pelvic:  Cervical exam  deferred        Extremities: Normal range of motion.     ental Status: Normal mood and affect. Normal behavior. Normal judgment and thought content.     Assessment   32 y.o. [id number] at 7146w1d by  11/24/2017, by Last Menstrual Period presenting for routine prenatal visit  Plan   pregnancy #5 Problems (from 05/25/17 to present)    Problem Noted Resolved   Supervision of high risk pregnancy, antepartum 06/14/2017 by Natale MilchSchuman, Christanna R, MD No   Overview Addendum 09/13/2017  9:39 AM by Natale MilchSchuman, Christanna R, MD      Clinic [facility] Prenatal Labs  Dating LMP = 5wk US Blood type: O/Positive/-- (03/26 1543)   Genetic Screen    NIPS: Normal XX   Antibody:Negative (03/26 1543)  Anatomic US complete Rubella: 3.12 (03/26 1543) Varicella: Immune  GTT Early:        28 wk: 116      RPR: Non Reactive (03/26 1543)   Rhogam  not inducated HBsAg: Negative (03/26 1543)   TDaP vaccine                       HIV: Non Reactive (03/26 1543)   Flu Shot                                GBS:   Contraception  BTL Pap: NIL HPV negative  CBB   Given information   CS/VBAC Repeat Cesareanx5 with BTL   Baby Food    Support Person             Anemia during pregnancy in third trimester 06/14/2017 by Natale MilchSchuman, Christanna R, MD No   Marginal placenta previa 06/22/2017 by Natale MilchSchuman, Christanna R, MD 07/13/2017 by Natale MilchSchuman, Christanna R, MD   Overview Signed 06/22/2017  6:03 PM by Natale MilchSchuman, Christanna R, MD    Pelvic rest      Nausea and vomiting in pregnancy 06/14/2017 by Natale MilchSchuman, Christanna R, MD 07/13/2017 by Natale MilchSchuman, Christanna R, MD       Gestational age appropriate obstetric precautions including but not limited to vaginal bleeding, contractions, leaking of fluid and fetal movement were reviewed in detail with the patient.    Return in about 2 weeks (around 10/28/2017) for ROB.  [name] MD [facility], [facility] 10/14/17 3:50 PM

## 2017-10-14 NOTE — Progress Notes (Signed)
ROB  Growth scan 

## 2017-10-18 ENCOUNTER — Other Ambulatory Visit: Payer: Self-pay | Admitting: Obstetrics and Gynecology

## 2017-10-18 DIAGNOSIS — R2243 Localized swelling, mass and lump, lower limb, bilateral: Secondary | ICD-10-CM

## 2017-10-19 ENCOUNTER — Other Ambulatory Visit: Payer: Self-pay | Admitting: Obstetrics and Gynecology

## 2017-10-19 ENCOUNTER — Telehealth: Payer: Self-pay | Admitting: Obstetrics and Gynecology

## 2017-10-19 DIAGNOSIS — O1203 Gestational edema, third trimester: Secondary | ICD-10-CM

## 2017-10-19 NOTE — Telephone Encounter (Signed)
Patient is aware of appointment on Thursday, 10/21/17 [facility], and [facility]. Patient was offered an appointment on 10/19/17, but had to be rescheduled due to work.

## 2017-10-19 NOTE — Telephone Encounter (Signed)
-----   Message from Natale Milchhristanna R Schuman, MD sent at 10/18/2017 12:03 PM EDT ----- I ordered this patient a leg US, can you help schedule it for this week? Thank you,  Dr. Sherrie GeorgeSchuma

## 2017-10-19 NOTE — Telephone Encounter (Signed)
Lmtrc

## 2017-10-20 ENCOUNTER — Ambulatory Visit: Payer: Medicaid Other

## 2017-10-21 ENCOUNTER — Ambulatory Visit
Admission: RE | Admit: 2017-10-21 | Discharge: 2017-10-21 | Disposition: A | Discharge status: Home / Self Care | Payer: Medicaid Other | Source: Ambulatory Visit | Attending: Obstetrics and Gynecology | Admitting: Obstetrics and Gynecology

## 2017-10-21 DIAGNOSIS — Z3A32 32 weeks gestation of pregnancy: Secondary | ICD-10-CM | POA: Diagnosis not present

## 2017-10-21 DIAGNOSIS — O1203 Gestational edema, third trimester: Secondary | ICD-10-CM | POA: Diagnosis not present

## 2017-10-24 NOTE — Progress Notes (Signed)
[facility]  Telephone:(336) [phone] Fax:(336) [phone]  ID: Michele [name] OB: [birth year]  MR#: [id number]  YQM#:[id number]  Patient Care Team: Center, [facility] as PCP - General (General Practice)  CHIEF COMPLAINT: Anemia during pregnancy in the third trimester.  INTERVAL HISTORY: Patient returns to clinic today for repeat laboratory can further evaluation.  She continues to feel well and remains asymptomatic.  She does not complain of weakness or fatigue today.  She has no neurologic complaints.  She denies any recent fevers or illnesses.  She is gaining weight appropriately.  She has no chest pain or shortness of breath.  She denies any nausea, vomiting, constipation, or diarrhea.  She has no urinary complaints.  Patient offers no specific complaints today.  REVIEW OF SYSTEMS:   Review of Systems  Constitutional: Negative.  Negative for fever, malaise/fatigue and weight loss.  Respiratory: Negative.  Negative for cough and shortness of breath.   Cardiovascular: Negative.  Negative for chest pain and leg swelling.  Gastrointestinal: Negative.  Negative for abdominal pain, blood in stool, melena, nausea and vomiting.  Genitourinary: Negative.  Negative for hematuria.  Musculoskeletal: Negative.  Negative for back pain.  Skin: Negative.  Negative for rash.  Neurological: Negative.  Negative for sensory change, focal weakness, weakness and headaches.  Psychiatric/Behavioral: Negative.  The patient is not nervous/anxious.     As per HPI. Otherwise, a complete review of systems is negative.  PAST MEDICAL HISTORY: Past Medical History:  Diagnosis Date  . Anemia during pregnancy in second trimester 06/14/2017  . Anxiety   . Depression     PAST SURGICAL HISTORY: Past Surgical History:  Procedure Laterality Date  . CESAREAN SECTION     times 4  . KNEE SURGERY    . TONSILLECTOMY      FAMILY HISTORY: Family History  Problem Relation Age of  Onset  . Mental illness Father   . Depression Father   . Mental illness Sister   . Schizophrenia Sister   . Depression Mother   . Cancer - Cervical Paternal Grandmother 35    ADVANCED DIRECTIVES (Y/N):  N  HEALTH MAINTENANCE: Social History   Tobacco Use  . Smoking status: Never Smoker  . Smokeless tobacco: Never Used  Substance Use Topics  . Alcohol use: No    Comment: rare  . Drug use: No     Colonoscopy:  PAP:  Bone density:  Lipid panel:  Allergies  Allergen Reactions  . Penicillins Hives  . Sulfa Antibiotics Hives    Current Outpatient Medications  Medication Sig Dispense Refill  . ferrous sulfate (FERROUSUL) 325 (65 FE) MG tablet Take 1 tablet (325 mg total) by mouth 2 (two) times daily. 60 tablet 11  . Prenatal Vit-Fe Fumarate-FA (MULTIVITAMIN-PRENATAL) 27-0.8 MG TABS tablet Take 1 tablet by mouth daily at 12 noon.     No current facility-administered medications for this visit.     OBJECTIVE: Vitals:   10/28/17 1443 10/28/17 1448  BP:  122/77  Pulse:  93  Resp: 16   Temp:  98.2 F (36.8 C)     Body mass index is 41.37 kg/m.    ECOG FS:0 - Asymptomatic  General: Well-developed, well-nourished, no acute distress. Eyes: Pink conjunctiva, anicteric sclera. HEENT: Normocephalic, moist mucous membranes, clear oropharnyx. Lungs: Clear to auscultation bilaterally. Heart: Regular rate and rhythm. No rubs, murmurs, or gallops. Abdomen: Appears appropriate for gestational age. Musculoskeletal: No edema, cyanosis, or clubbing. Neuro: Alert, answering all questions appropriately. Cranial nerves  grossly intact. Skin: No rashes or petechiae noted. Psych: Normal affect.  LAB RESULTS:  Lab Results  Component Value Date   NA 136 06/20/2017   K 4.0 06/20/2017   CL 105 06/20/2017   CO2 25 06/20/2017   GLUCOSE 81 06/20/2017   BUN 7 06/20/2017   CREATININE 0.50 06/20/2017   CALCIUM 8.7 (L) 06/20/2017   PROT 6.4 (L) 06/20/2017   ALBUMIN 3.4 (L) 06/20/2017    AST 16 06/20/2017   ALT 15 06/20/2017   ALKPHOS 79 06/20/2017   BILITOT 0.2 (L) 06/20/2017   GFRNONAA >60 06/20/2017   GFRAA >60 06/20/2017    Lab Results  Component Value Date   WBC 11.0 10/28/2017   NEUTROABS 9.0 (H) 10/28/2017   HGB 9.1 (L) 10/28/2017   HCT 25.7 (L) 10/28/2017   MCV 82.8 10/28/2017   PLT 249 10/28/2017   Lab Results  Component Value Date   IRON 36 10/28/2017   TIBC 458 (H) 10/28/2017   IRONPCTSAT 8 (L) 10/28/2017   Lab Results  Component Value Date   FERRITIN 25 10/28/2017      STUDIES: Koreas Ob Follow Up  Result Date: 10/14/2017 ULTRASOUND REPORT Patient Name: Michele [name] DOB: 01-13-1986 MRN: [id number] Location: [facility] Date of Service: 10/14/2017 Indications:growth/afi Findings: Mason JimSingleton intrauterine pregnancy is visualized with FHR at 137 BPM. Biometrics give an (U/S) Gestational age of 2333w6d and an (U/S) EDD of 11/26/2017; this correlates with the clinically established Estimated Date of Delivery: 11/24/17. Fetal presentation is Cephalic. Placenta: Posterior, grade 1. AFI: 22.87 cm Growth percentile is 40th . EFW: 5lbs 1oz/ 2291g (document both grams and pounds) Impression: 1. 7431w1d Viable Singleton Intrauterine pregnancy previously established criteria. 2. Growth is 40th %ile.  AFI is 22.87 cm. Recommendations: 1.Clinical correlation with the patient's History and Physical Exam. Willette AlmaKristen Priestley, RDMS, RVT I have reviewed this ultrasound and the report. I agree with the above assessment and plan. [name] MD [facility] 10/14/17 3:35 PM    [place]as Venous Img Lower Bilateral  Result Date: 10/22/2017 CLINICAL DATA:  32 year old gravid female currently [redacted] weeks pregnant. EXAM: BILATERAL LOWER EXTREMITY VENOUS DOPPLER ULTRASOUND TECHNIQUE: Gray-scale sonography with graded compression, as well as color Doppler and duplex ultrasound were performed to evaluate the lower extremity deep venous systems from the  level of the common femoral vein and including the common femoral, femoral, profunda femoral, popliteal and calf veins including the posterior tibial, peroneal and gastrocnemius veins when visible. The superficial great saphenous vein was also interrogated. Spectral Doppler was utilized to evaluate flow at rest and with distal augmentation maneuvers in the common femoral, femoral and popliteal veins. COMPARISON:  None. FINDINGS: RIGHT LOWER EXTREMITY Common Femoral Vein: No evidence of thrombus. Normal compressibility, respiratory phasicity and response to augmentation. Saphenofemoral Junction: No evidence of thrombus. Normal compressibility and flow on color Doppler imaging. Profunda Femoral Vein: No evidence of thrombus. Normal compressibility and flow on color Doppler imaging. Femoral Vein: No evidence of thrombus. Normal compressibility, respiratory phasicity and response to augmentation. Popliteal Vein: No evidence of thrombus. Normal compressibility, respiratory phasicity and response to augmentation. Calf Veins: No evidence of thrombus. Normal compressibility and flow on color Doppler imaging. Superficial Great Saphenous Vein: No evidence of thrombus. Normal compressibility. Venous Reflux:  None. Other Findings:  None. LEFT LOWER EXTREMITY Common Femoral Vein: No evidence of thrombus. Normal compressibility, respiratory phasicity and response to augmentation. Saphenofemoral Junction: No evidence of thrombus. Normal compressibility and flow on color Doppler imaging.  Profunda Femoral Vein: No evidence of thrombus. Normal compressibility and flow on color Doppler imaging. Femoral Vein: No evidence of thrombus. Normal compressibility, respiratory phasicity and response to augmentation. Popliteal Vein: No evidence of thrombus. Normal compressibility, respiratory phasicity and response to augmentation. Calf Veins: No evidence of thrombus. Normal compressibility and flow on color Doppler imaging. Superficial Great  Saphenous Vein: No evidence of thrombus. Normal compressibility. Venous Reflux:  None. Other Findings:  None. IMPRESSION: No evidence of deep venous thrombosis in either lower extremity. Electronically Signed   By: Malachy Moan M.D.   On: 10/22/2017 08:22    ASSESSMENT: Anemia during pregnancy in the third trimester.  PLAN:    1. Anemia during pregnancy in the third trimester: Patient's hemoglobin has only mildly improved and her iron stores remain significantly reduced.  She has been instructed to continue her oral iron supplementation.  Proceed with 1 additional infusion of 510 mg IV Feraheme today.  Patient C-section is scheduled in the next 2 weeks, therefore she will return to clinic in 3 months with repeat laboratory work and further evaluation. 2.  Pregnancy: Scheduled C-section in mid September as above.    I spent a total of 30 minutes face-to-face with the patient of which greater than 50% of the visit was spent in counseling and coordination of care as detailed above.   Patient expressed understanding and was in agreement with this plan. She also understands that She can call clinic at any time with any questions, concerns, or complaints.    Jeralyn Ruths, MD   11/01/2017 8:10 AM

## 2017-10-27 ENCOUNTER — Ambulatory Visit (INDEPENDENT_AMBULATORY_CARE_PROVIDER_SITE_OTHER): Payer: Medicaid Other | Admitting: Obstetrics & Gynecology

## 2017-10-27 VITALS — BP 120/80 | Wt 240.0 lb

## 2017-10-27 DIAGNOSIS — O0990 Supervision of high risk pregnancy, unspecified, unspecified trimester: Secondary | ICD-10-CM

## 2017-10-27 DIAGNOSIS — Z23 Encounter for immunization: Secondary | ICD-10-CM | POA: Diagnosis not present

## 2017-10-27 DIAGNOSIS — O34219 Maternal care for unspecified type scar from previous cesarean delivery: Secondary | ICD-10-CM

## 2017-10-27 DIAGNOSIS — Z3A36 36 weeks gestation of pregnancy: Secondary | ICD-10-CM

## 2017-10-27 LAB — POCT URINALYSIS DIPSTICK OB
GLUCOSE, UA: NEGATIVE
POC,PROTEIN,UA: NEGATIVE

## 2017-10-27 NOTE — Patient Instructions (Signed)

## 2017-10-27 NOTE — Progress Notes (Signed)
  Subjective  Fetal Movement? yes Contractions? no Leaking Fluid? no Vaginal Bleeding? no Low back pain and pain around CS scar. Objective  BP 120/80   Wt 240 lb (108.9 kg)   LMP 02/17/2017 (Exact Date) Comment: not normal  BMI 41.20 kg/m  General: NAD Pumonary: no increased work of breathing Abdomen: gravid, non-tender Extremities: no edema Psychiatric: mood appropriate, affect full SVE- closed thick high, breech US for FHTs today 150s, Breech Assessment  32 y.o. [id number] at 2028w0d by  11/24/2017, by Last Menstrual Period presenting for routine prenatal visit  Plan   Problem List Items Addressed This Visit      Other   Supervision of high risk pregnancy, antepartum   Previous cesarean section complicating pregnancy    Other Visit Diagnoses    [redacted] weeks gestation of pregnancy    -  Primary   Relevant Orders   POC Urinalysis Dipstick OB (Completed)   Culture, beta strep (group b only)    Breech discussed.  CS and BTL planned. Heat, rest, Tylenol, Compression for pains Note for work w restrictions Flu shot today  Annamarie MajorPaul Bronte Sabado, MD, Merlinda FrederickFACOG [facility], [facility] 10/27/2017  10:30 AM

## 2017-10-28 ENCOUNTER — Inpatient Hospital Stay (HOSPITAL_BASED_OUTPATIENT_CLINIC_OR_DEPARTMENT_OTHER): Payer: Medicaid Other | Admitting: Oncology

## 2017-10-28 ENCOUNTER — Encounter: Payer: Self-pay | Admitting: Oncology

## 2017-10-28 ENCOUNTER — Inpatient Hospital Stay: Payer: Medicaid Other

## 2017-10-28 ENCOUNTER — Inpatient Hospital Stay: Payer: Medicaid Other | Attending: Oncology

## 2017-10-28 ENCOUNTER — Other Ambulatory Visit: Payer: Self-pay

## 2017-10-28 ENCOUNTER — Encounter: Payer: Medicaid Other | Admitting: Obstetrics and Gynecology

## 2017-10-28 VITALS — BP 122/77 | HR 93 | Temp 98.2°F | Resp 16 | Ht 64.0 in | Wt 241.0 lb

## 2017-10-28 DIAGNOSIS — O99013 Anemia complicating pregnancy, third trimester: Secondary | ICD-10-CM

## 2017-10-28 DIAGNOSIS — O0913 Supervision of pregnancy with history of ectopic or molar pregnancy, third trimester: Secondary | ICD-10-CM | POA: Diagnosis not present

## 2017-10-28 DIAGNOSIS — D509 Iron deficiency anemia, unspecified: Secondary | ICD-10-CM | POA: Diagnosis not present

## 2017-10-28 LAB — CBC WITH DIFFERENTIAL/PLATELET
Basophils Absolute: 0 10*3/uL (ref 0–0.1)
Basophils Relative: 0 %
EOS ABS: 0 10*3/uL (ref 0–0.7)
EOS PCT: 0 %
HCT: 25.7 % — ABNORMAL LOW (ref 35.0–47.0)
HEMOGLOBIN: 9.1 g/dL — AB (ref 12.0–16.0)
LYMPHS ABS: 1.4 10*3/uL (ref 1.0–3.6)
Lymphocytes Relative: 13 %
MCH: 29.4 pg (ref 26.0–34.0)
MCHC: 35.5 g/dL (ref 32.0–36.0)
MCV: 82.8 fL (ref 80.0–100.0)
MONO ABS: 0.5 10*3/uL (ref 0.2–0.9)
Monocytes Relative: 4 %
Neutro Abs: 9 10*3/uL — ABNORMAL HIGH (ref 1.4–6.5)
Neutrophils Relative %: 83 %
PLATELETS: 249 10*3/uL (ref 150–440)
RBC: 3.1 MIL/uL — ABNORMAL LOW (ref 3.80–5.20)
RDW: 16.1 % — ABNORMAL HIGH (ref 11.5–14.5)
WBC: 11 10*3/uL (ref 3.6–11.0)

## 2017-10-28 LAB — IRON AND TIBC
IRON: 36 ug/dL (ref 28–170)
Saturation Ratios: 8 % — ABNORMAL LOW (ref 10.4–31.8)
TIBC: 458 ug/dL — ABNORMAL HIGH (ref 250–450)
UIBC: 422 ug/dL

## 2017-10-28 LAB — FERRITIN: FERRITIN: 25 ng/mL (ref 11–307)

## 2017-10-28 MED ORDER — SODIUM CHLORIDE 0.9 % IV SOLN
510.0000 mg | Freq: Once | INTRAVENOUS | Status: AC
  Administered 2017-10-28: 510 mg via INTRAVENOUS
  Filled 2017-10-28: qty 17

## 2017-10-28 MED ORDER — SODIUM CHLORIDE 0.9 % IV SOLN
INTRAVENOUS | Status: DC
  Administered 2017-10-28: 15:00:00 via INTRAVENOUS
  Filled 2017-10-28: qty 250

## 2017-10-28 NOTE — Progress Notes (Signed)
Patient here follow up. No changes since last appt.

## 2017-10-31 LAB — CULTURE, BETA STREP (GROUP B ONLY): STREP GP B CULTURE: NEGATIVE

## 2017-11-02 ENCOUNTER — Other Ambulatory Visit: Payer: Self-pay

## 2017-11-02 ENCOUNTER — Ambulatory Visit (INDEPENDENT_AMBULATORY_CARE_PROVIDER_SITE_OTHER): Payer: Medicaid Other | Admitting: Maternal Newborn

## 2017-11-02 ENCOUNTER — Observation Stay
Admission: EM | Admit: 2017-11-02 | Discharge: 2017-11-02 | Disposition: A | Discharge status: Home / Self Care | Payer: Medicaid Other | Attending: Obstetrics & Gynecology | Admitting: Obstetrics & Gynecology

## 2017-11-02 ENCOUNTER — Encounter: Payer: Self-pay | Admitting: Maternal Newborn

## 2017-11-02 VITALS — BP 130/80 | Wt 240.0 lb

## 2017-11-02 DIAGNOSIS — O210 Mild hyperemesis gravidarum: Secondary | ICD-10-CM | POA: Insufficient documentation

## 2017-11-02 DIAGNOSIS — O9989 Other specified diseases and conditions complicating pregnancy, childbirth and the puerperium: Secondary | ICD-10-CM

## 2017-11-02 DIAGNOSIS — O0990 Supervision of high risk pregnancy, unspecified, unspecified trimester: Secondary | ICD-10-CM

## 2017-11-02 DIAGNOSIS — Z880 Allergy status to penicillin: Secondary | ICD-10-CM | POA: Diagnosis not present

## 2017-11-02 DIAGNOSIS — O26899 Other specified pregnancy related conditions, unspecified trimester: Secondary | ICD-10-CM

## 2017-11-02 DIAGNOSIS — R1011 Right upper quadrant pain: Secondary | ICD-10-CM | POA: Diagnosis not present

## 2017-11-02 DIAGNOSIS — O99013 Anemia complicating pregnancy, third trimester: Secondary | ICD-10-CM | POA: Diagnosis not present

## 2017-11-02 DIAGNOSIS — O26893 Other specified pregnancy related conditions, third trimester: Secondary | ICD-10-CM | POA: Diagnosis present

## 2017-11-02 DIAGNOSIS — O219 Vomiting of pregnancy, unspecified: Secondary | ICD-10-CM

## 2017-11-02 DIAGNOSIS — Z882 Allergy status to sulfonamides status: Secondary | ICD-10-CM | POA: Insufficient documentation

## 2017-11-02 DIAGNOSIS — Z3A36 36 weeks gestation of pregnancy: Secondary | ICD-10-CM

## 2017-11-02 DIAGNOSIS — R109 Unspecified abdominal pain: Secondary | ICD-10-CM

## 2017-11-02 DIAGNOSIS — R110 Nausea: Secondary | ICD-10-CM

## 2017-11-02 LAB — COMPREHENSIVE METABOLIC PANEL
ALBUMIN: 3.2 g/dL — AB (ref 3.5–5.0)
ALK PHOS: 146 U/L — AB (ref 38–126)
ALT: 17 U/L (ref 0–44)
AST: 22 U/L (ref 15–41)
Anion gap: 4 — ABNORMAL LOW (ref 5–15)
BILIRUBIN TOTAL: 0.3 mg/dL (ref 0.3–1.2)
BUN: 6 mg/dL (ref 6–20)
CALCIUM: 8.8 mg/dL — AB (ref 8.9–10.3)
CO2: 26 mmol/L (ref 22–32)
CREATININE: 0.47 mg/dL (ref 0.44–1.00)
Chloride: 105 mmol/L (ref 98–111)
GFR calc Af Amer: >60 mL/min (ref >60)
GLUCOSE: 81 mg/dL (ref 70–99)
Potassium: 4.2 mmol/L (ref 3.5–5.1)
Sodium: 135 mmol/L (ref 135–145)
Total Protein: 6.3 g/dL — ABNORMAL LOW (ref 6.5–8.1)

## 2017-11-02 LAB — PROTEIN / CREATININE RATIO, URINE
CREATININE, URINE: 53 mg/dL
Protein Creatinine Ratio: 0.11 mg/mg{Cre} (ref 0.00–0.15)
Total Protein, Urine: 6 mg/dL

## 2017-11-02 LAB — CBC
HEMATOCRIT: 27.7 % — AB (ref 35.0–47.0)
HEMOGLOBIN: 9.3 g/dL — AB (ref 12.0–16.0)
MCH: 28.4 pg (ref 26.0–34.0)
MCHC: 33.6 g/dL (ref 32.0–36.0)
MCV: 84.4 fL (ref 80.0–100.0)
Platelets: 246 10*3/uL (ref 150–440)
RBC: 3.28 MIL/uL — ABNORMAL LOW (ref 3.80–5.20)
RDW: 16.3 % — AB (ref 11.5–14.5)
WBC: 11.8 10*3/uL — AB (ref 3.6–11.0)

## 2017-11-02 MED ORDER — ACETAMINOPHEN-CODEINE #2 300-15 MG PO TABS: 1.0000 | ORAL_TABLET | Freq: Four times a day (QID) | ORAL | 0 refills | Status: AC | PRN

## 2017-11-02 NOTE — Progress Notes (Signed)
Prenatal Problem Visit  Subjective  Michele [name] is a 32 y.o. G5P4004 at [redacted]w[redacted]d being seen today for ongoing prenatal care.  She is currently monitored for the following issues for this high-risk pregnancy and has Intentional opiate overdose (HCC); Suicidal ideation; Major depressive disorder, recurrent, severe without psychotic features (HCC); Supervision of high risk pregnancy, antepartum; Restless leg syndrome; Primary insomnia; Anemia during pregnancy in third trimester; and Previous cesarean section complicating pregnancy on their problem list.  ----------------------------------------------------------------------------------- Patient reports right upper quadrant pain radiating to her back on the right side since early this morning. She reports nausea and emesis x2 with no blood. Has not had any food since last night. Also reports a headache and feeling "clammy." No vaginal bleeding. Denies contractions.  No leaking of fluid.  ----------------------------------------------------------------------------------- The following portions of the patient's history were reviewed and updated as appropriate: allergies, current medications, past family history, past medical history, past social history, past surgical history and problem list. Problem list updated.   Objective  Blood pressure 130/80, weight 240 lb (108.9 kg), last menstrual period 02/17/2017. Pregravid weight 223 lb (101.2 kg) Total Weight Gain 17 lb (7.711 kg)   General:  Alert, oriented and cooperative.   Skin: Skin is warm and dry. No rash noted.   Cardiovascular: Normal heart rate noted  Respiratory: Normal respiratory effort, no problems with respiration noted  Abdomen: Appropriate for gestational age. Pain/Pressure: Present     Pelvic:  Cervical exam deferred        Extremities: Normal range of motion.     Mental Status: Normal mood and affect. Normal behavior. Normal judgment and thought content.   Some guarding  of RUQ. Appears in pain.  Assessment   32 y.o. [id number] at [redacted]w[redacted]d, EDD 11/24/2017 by Last Menstrual Period presenting for a prenatal problem visit.  Plan   pregnancy #5 Problems (from 05/25/17 to present)    Problem Noted Resolved   Supervision of high risk pregnancy, antepartum 06/14/2017 by Natale Milch, MD No   Overview Addendum 10/14/2017  3:49 PM by Natale Milch, MD      Clinic [facility] Prenatal Labs  Dating LMP = 5wk [place] Blood type: O/Positive/-- (03/26 1543)   Genetic Screen    NIPS: Normal XX   Antibody:Negative (03/26 1543)  Anatomic [place] complete Rubella: 3.12 (03/26 1543) Varicella: Immune  GTT Early:        28 wk: 116      RPR: Non Reactive (03/26 1543)   Rhogam  not inducated HBsAg: Negative (03/26 1543)   TDaP vaccine    08/30/17                   HIV: Non Reactive (03/26 1543)   Flu Shot                                GBS:   Contraception  BTL Pap: NIL HPV negative  CBB   Given information   CS/VBAC Repeat Cesareanx5 with BTL   Baby Food    Support Person             Anemia during pregnancy in third trimester 06/14/2017 by Natale Milch, MD No   Marginal placenta previa 06/22/2017 by Natale Milch, MD 07/13/2017 by Natale Milch, MD   Overview Signed 06/22/2017  6:03 PM by Natale Milch, MD    Pelvic rest  Nausea and vomiting in pregnancy 06/14/2017 by Natale Milch, MD 07/13/2017 by Natale Milch, MD     Advised patient to report to triage on Labor and Delivery for labs/further evaluation given the nature of her symptoms.  Marcelyn Bruins, CNM 11/02/2017  11:30 AM

## 2017-11-02 NOTE — Discharge Summary (Signed)
Physician Final Progress Note  Patient ID: Michele [name] MRN: [id number] DOB/AGE: 10-21-1985 32 y.o.  Admit date: 11/02/2017 Admitting provider: Nadara Mustard, MD Discharge date: 11/02/2017   Admission Diagnoses: right upper quadrant pain, nausea  Discharge Diagnoses:  Active Problems:   Indication for care in labor and delivery, antepartum 32 yo G5 P4004 at 36 weeks 6 days, reactive NST, normal labs, normotensive, right upper quadrant pain  History of Present Illness: The patient is a 32 y.o. female G5P4004 at [redacted]w[redacted]d who presents for an episode of sharp pain in her right upper quadrant this morning at 6 am followed by nausea and vomiting. She has also had a headache today. She was seen at the office and was told to come to the hospital for further evaluation. Her blood pressure at the office was mildly elevated. She describes her pain as a dull ache since being at the hospital. She admits good fetal movement. She denies contractions. She denies leakage of fluid or vaginal bleeding. She admits a history of gall sludge years ago but no other history of gall bladder disease. Her blood pressures and labs have been normal while at the hospital and NST is reactive.   Past Medical History:  Diagnosis Date  . Anemia during pregnancy in second trimester 06/14/2017  . Anxiety   . Depression     Past Surgical History:  Procedure Laterality Date  . CESAREAN SECTION     times 4  . KNEE SURGERY    . TONSILLECTOMY      No current facility-administered medications on file prior to encounter.    Current Outpatient Medications on File Prior to Encounter  Medication Sig Dispense Refill  . ferrous sulfate (FERROUSUL) 325 (65 FE) MG tablet Take 1 tablet (325 mg total) by mouth 2 (two) times daily. 60 tablet 11  . Prenatal Vit-Fe Fumarate-FA (MULTIVITAMIN-PRENATAL) 27-0.8 MG TABS tablet Take 1 tablet by mouth daily at 12 noon.      Allergies  Allergen Reactions  . Penicillins Hives  .  Sulfa Antibiotics Hives    Social History   Socioeconomic History  . Marital status: Legally Separated    Spouse name: Not on file  . Number of children: Not on file  . Years of education: Not on file  . Highest education level: Not on file  Occupational History  . Occupation: Fish farm manager: [employer]  Social Needs  . Financial resource strain: Not hard at all  . Food insecurity:    Worry: Never true    Inability: Never true  . Transportation needs:    Medical: No    Non-medical: No  Tobacco Use  . Smoking status: Never Smoker  . Smokeless tobacco: Never Used  Substance and Sexual Activity  . Alcohol use: No    Comment: rare  . Drug use: No  . Sexual activity: Yes    Birth control/protection: None  Lifestyle  . Physical activity:    Days per week: 0 days    Minutes per session: 0 min  . Stress: Very much  Relationships  . Social connections:    Talks on phone: More than three times a week    Gets together: More than three times a week    Attends religious service: Never    Active member of club or organization: No    Attends meetings of clubs or organizations: Never    Relationship status: Living with partner  . Intimate partner violence:    Fear  of current or ex partner: Not on file    Emotionally abused: Not on file    Physically abused: Not on file    Forced sexual activity: Not on file  Other Topics Concern  . Not on file  Social History Narrative  . Not on file    Physical Exam: BP (!) 120/57   Pulse 92   Temp 98.3 F (36.8 C) (Oral)   Resp 20   Ht 5\' 4"  (1.626 m)   Wt 108.9 kg   LMP 02/17/2017 (Exact Date) Comment: not normal  BMI 41.20 kg/m   Gen: NAD CV: RRR Pulm: CTAB Pelvic: deferred Toco: negative Fetal Well Being: 150 bpm baseline, moderate variability, +accelerations, -decelerations Ext: reflexes 2+  Consults: None  Significant Findings/ Diagnostic Studies: labs:   Results for Michele, [name] (MRN [id number])  as of 11/02/2017 14:29  Ref. Range 11/02/2017 12:22 11/02/2017 13:00  Sodium Latest Ref Range: 135 - 145 mmol/L 135   Potassium Latest Ref Range: 3.5 - 5.1 mmol/L 4.2   Chloride Latest Ref Range: 98 - 111 mmol/L 105   CO2 Latest Ref Range: 22 - 32 mmol/L 26   Glucose Latest Ref Range: 70 - 99 mg/dL 81   BUN Latest Ref Range: 6 - 20 mg/dL 6   Creatinine Latest Ref Range: 0.44 - 1.00 mg/dL 3.73   Calcium Latest Ref Range: 8.9 - 10.3 mg/dL 8.8 (L)   Anion gap Latest Ref Range: 5 - 15  4 (L)   Alkaline Phosphatase Latest Ref Range: 38 - 126 U/L 146 (H)   Albumin Latest Ref Range: 3.5 - 5.0 g/dL 3.2 (L)   AST Latest Ref Range: 15 - 41 U/L 22   ALT Latest Ref Range: 0 - 44 U/L 17   Total Protein Latest Ref Range: 6.5 - 8.1 g/dL 6.3 (L)   Total Bilirubin Latest Ref Range: 0.3 - 1.2 mg/dL 0.3   GFR, Est Non African American Latest Ref Range: >60 mL/min >60   GFR, Est African American Latest Ref Range: >60 mL/min >60   WBC Latest Ref Range: 3.6 - 11.0 K/uL 11.8 (H)   RBC Latest Ref Range: 3.80 - 5.20 MIL/uL 3.28 (L)   Hemoglobin Latest Ref Range: 12.0 - 16.0 g/dL 9.3 (L)   HCT Latest Ref Range: 35.0 - 47.0 % 27.7 (L)   MCV Latest Ref Range: 80.0 - 100.0 fL 84.4   MCH Latest Ref Range: 26.0 - 34.0 pg 28.4   MCHC Latest Ref Range: 32.0 - 36.0 g/dL 42.8   RDW Latest Ref Range: 11.5 - 14.5 % 16.3 (H)   Platelets Latest Ref Range: 150 - 440 K/uL 246   Total Protein, Urine Latest Units: mg/dL  6  Protein Creatinine Ratio Latest Ref Range: 0.00 - 0.15 mg/mgCre  0.11  Creatinine, Urine Latest Units: mg/dL  53    Procedures: NST  Discharge Condition: good  Disposition: Discharge disposition: 01-Home or Self Care       Diet: Regular diet  Discharge Activity: Activity as tolerated, rest today  Discharge Instructions    Discharge activity:  No Restrictions   Complete by:  As directed    Discharge diet:  No restrictions   Complete by:  As directed    Fetal Kick Count:  Lie on our left side  for one hour after a meal, and count the number of times your baby kicks.  If it is less than 5 times, get up, move around and drink some juice.  Repeat the test 30 minutes later.  If it is still less than 5 kicks in an hour, notify your doctor.   Complete by:  As directed    No sexual activity restrictions   Complete by:  As directed    Notify physician for a general feeling that "something is not right"   Complete by:  As directed    Notify physician for increase or change in vaginal discharge   Complete by:  As directed    Notify physician for intestinal cramps, with or without diarrhea, sometimes described as "gas pain"   Complete by:  As directed    Notify physician for leaking of fluid   Complete by:  As directed    Notify physician for low, dull backache, unrelieved by heat or Tylenol   Complete by:  As directed    Notify physician for menstrual like cramps   Complete by:  As directed    Notify physician for pelvic pressure   Complete by:  As directed    Notify physician for uterine contractions.  These may be painless and feel like the uterus is tightening or the baby is  "balling up"   Complete by:  As directed    Notify physician for vaginal bleeding   Complete by:  As directed    PRETERM LABOR:  Includes any of the follwing symptoms that occur between 20 - [redacted] weeks gestation.  If these symptoms are not stopped, preterm labor can result in preterm delivery, placing your baby at risk   Complete by:  As directed      Allergies as of 11/02/2017      Reactions   Penicillins Hives   Sulfa Antibiotics Hives      Medication List    TAKE these medications   acetaminophen-codeine 300-15 MG tablet Commonly known as:  TYLENOL #2 Take 1 tablet by mouth every 6 (six) hours as needed for up to 2 days for moderate pain.   ferrous sulfate 325 (65 FE) MG tablet Take 1 tablet (325 mg total) by mouth 2 (two) times daily.   multivitamin-prenatal 27-0.8 MG Tabs tablet Take 1 tablet by  mouth daily at 12 noon.      Follow-up Information    [facility]. Go to.   Specialty:  Obstetrics and Gynecology Why:  regular scheduled prenatal appointment Contact information: [address] [place] [address] [phone]          Total time spent taking care of this patient: 20 minutes  Signed: Tresea Mall, CNM  11/02/2017, 2:37 PM

## 2017-11-03 ENCOUNTER — Encounter: Payer: Medicaid Other | Admitting: Obstetrics & Gynecology

## 2017-11-03 ENCOUNTER — Ambulatory Visit (INDEPENDENT_AMBULATORY_CARE_PROVIDER_SITE_OTHER): Payer: Medicaid Other | Admitting: Obstetrics & Gynecology

## 2017-11-03 VITALS — BP 120/80 | Wt 240.0 lb

## 2017-11-03 DIAGNOSIS — O0990 Supervision of high risk pregnancy, unspecified, unspecified trimester: Secondary | ICD-10-CM

## 2017-11-03 DIAGNOSIS — Z3A37 37 weeks gestation of pregnancy: Secondary | ICD-10-CM

## 2017-11-03 DIAGNOSIS — Z6841 Body Mass Index (BMI) 40.0 and over, adult: Secondary | ICD-10-CM

## 2017-11-03 DIAGNOSIS — O34219 Maternal care for unspecified type scar from previous cesarean delivery: Secondary | ICD-10-CM

## 2017-11-03 NOTE — Progress Notes (Signed)
  Subjective  Fetal Movement? yes Contractions? no Leaking Fluid? no Vaginal Bleeding? no RUQ pain yesterday (seen office and L&D)- no s/s PTL or liver/GB disease    Still has some pains    Still working daily Plans CS BTL 9/19 at 39 weeks Objective  BP 120/80   Wt 240 lb (108.9 kg)   LMP 02/17/2017 (Exact Date) Comment: not normal  BMI 41.20 kg/m  General: NAD Pumonary: no increased work of breathing Abdomen: gravid, non-tender Extremities: no edema Psychiatric: mood appropriate, affect full Abd: No rebound or guarding, mild RUQ pain to touch, no deep palpation pain or mass      Gravid, FHT 140s Assessment  32 y.o. [id number] at [redacted]w[redacted]d by  11/24/2017, by Last Menstrual Period presenting for routine prenatal visit  Plan   Problem List Items Addressed This Visit      Other   Supervision of high risk pregnancy, antepartum   Previous cesarean section complicating pregnancy   Morbid obesity with BMI of 40.0-44.9, adult (HCC)    Other Visit Diagnoses    [redacted] weeks gestation of pregnancy    -  Primary    Monitor pain Prefer 39 weeks for CS  Annamarie Major, MD, [facility], [facility] 11/03/2017  4:46 PM

## 2017-11-05 ENCOUNTER — Telehealth: Payer: Self-pay

## 2017-11-05 NOTE — Telephone Encounter (Signed)
If feels like contractions, then go to L&D for me to check her If feels like bladder infection, then also go to L&D for a urine check Otherwise, wait and see how she does this weekend Heat rest Tylenol

## 2017-11-05 NOTE — Telephone Encounter (Signed)
Pt aware.

## 2017-11-05 NOTE — Telephone Encounter (Signed)
Pt states PH told her to call if still having pain.  Pain from c/s is getting worse with using the bathroom - feels like it's pulling.  [phone]

## 2017-11-10 ENCOUNTER — Ambulatory Visit (INDEPENDENT_AMBULATORY_CARE_PROVIDER_SITE_OTHER): Payer: Medicaid Other | Admitting: Obstetrics & Gynecology

## 2017-11-10 VITALS — BP 120/80 | Wt 241.0 lb

## 2017-11-10 DIAGNOSIS — O0990 Supervision of high risk pregnancy, unspecified, unspecified trimester: Secondary | ICD-10-CM

## 2017-11-10 DIAGNOSIS — O34219 Maternal care for unspecified type scar from previous cesarean delivery: Secondary | ICD-10-CM

## 2017-11-10 DIAGNOSIS — Z3A38 38 weeks gestation of pregnancy: Secondary | ICD-10-CM

## 2017-11-10 LAB — POCT URINALYSIS DIPSTICK OB
Glucose, UA: NEGATIVE
PROTEIN: NEGATIVE

## 2017-11-10 NOTE — Addendum Note (Signed)
Addended by: Cornelius Moras D on: 11/10/2017 04:24 PM   Modules accepted: Orders

## 2017-11-10 NOTE — Progress Notes (Signed)
  Subjective  Fetal Movement? yes Contractions? no Leaking Fluid? no Vaginal Bleeding? no  Objective  BP 120/80   Wt 241 lb (109.3 kg)   LMP 02/17/2017 (Exact Date) Comment: not normal  BMI 41.37 kg/m  General: NAD Pumonary: no increased work of breathing Abdomen: gravid, non-tender Extremities: no edema Psychiatric: mood appropriate, affect full  Assessment  32 y.o. [id number] at [redacted]w[redacted]d by  11/24/2017, by Last Menstrual Period presenting for routine prenatal visit  Plan   Problem List Items Addressed This Visit      Other   Supervision of high risk pregnancy, antepartum   Previous cesarean section complicating pregnancy    Other Visit Diagnoses    [redacted] weeks gestation of pregnancy    -  Primary    Labor precautions, FMC, PNV, CS next week, BTL desired  Annamarie Major, MD, [facility], [facility] 11/10/2017  3:52 PM

## 2017-11-10 NOTE — Patient Instructions (Signed)
General Gynecological Post-Operative Instructions You may expect to feel dizzy, weak, and drowsy for as long as 24 hours after receiving the medicine that made you sleep (anesthetic).  Do not drive a car, ride a bicycle, participate in physical activities, or take public transportation until you are done taking narcotic pain medicines or as directed by your doctor.  Do not drink alcohol or take tranquilizers.  Do not take medicine that has not been prescribed by your doctor.  Do not sign important papers or make important decisions while on narcotic pain medicines.  Have a responsible person with you.  CARE OF INCISION  Take showers instead of baths until your doctor gives you permission to take baths.  No sexual intercourse or placement of anything in the vagina for 1 weeks or as instructed by your doctor. Only take prescription or over-the-counter medicines  for pain, discomfort, or fever as directed by your doctor. Do not take aspirin. It can make you bleed. Take medicines (antibiotics) that kill germs if they are prescribed for you.  Call the office or go to the ER if:  You feel sick to your stomach (nauseous) and you start to throw up (vomit).  You have trouble eating or drinking.  You have an oral temperature above 101.  You have constipation that is not helped by adjusting diet or increasing fluid intake. Pain medicines are a common cause of constipation.  You have any other concerns. SEEK IMMEDIATE MEDICAL CARE IF:  You have persistent dizziness.  You have difficulty breathing or a congested sounding (croupy) cough.  You have an oral temperature above 102.5, not controlled by medicine.  There is increasing pain or tenderness near or in the surgical site.      

## 2017-11-17 ENCOUNTER — Other Ambulatory Visit: Payer: Self-pay

## 2017-11-17 ENCOUNTER — Encounter
Admission: RE | Admit: 2017-11-17 | Discharge: 2017-11-17 | Disposition: A | Discharge status: Home / Self Care | Payer: Medicaid Other | Source: Ambulatory Visit | Attending: Obstetrics and Gynecology | Admitting: Obstetrics and Gynecology

## 2017-11-17 ENCOUNTER — Ambulatory Visit (INDEPENDENT_AMBULATORY_CARE_PROVIDER_SITE_OTHER): Payer: Medicaid Other | Admitting: Obstetrics and Gynecology

## 2017-11-17 ENCOUNTER — Encounter: Payer: Self-pay | Admitting: Obstetrics and Gynecology

## 2017-11-17 VITALS — BP 108/70 | HR 93 | Ht 64.0 in | Wt 238.5 lb

## 2017-11-17 DIAGNOSIS — O0990 Supervision of high risk pregnancy, unspecified, unspecified trimester: Secondary | ICD-10-CM

## 2017-11-17 DIAGNOSIS — Z3A39 39 weeks gestation of pregnancy: Secondary | ICD-10-CM

## 2017-11-17 DIAGNOSIS — O99013 Anemia complicating pregnancy, third trimester: Secondary | ICD-10-CM

## 2017-11-17 DIAGNOSIS — Z6841 Body Mass Index (BMI) 40.0 and over, adult: Secondary | ICD-10-CM

## 2017-11-17 DIAGNOSIS — O34219 Maternal care for unspecified type scar from previous cesarean delivery: Secondary | ICD-10-CM

## 2017-11-17 LAB — CBC
HCT: 28.8 % — ABNORMAL LOW (ref 35.0–47.0)
Hemoglobin: 10 g/dL — ABNORMAL LOW (ref 12.0–16.0)
MCH: 29.8 pg (ref 26.0–34.0)
MCHC: 34.8 g/dL (ref 32.0–36.0)
MCV: 85.7 fL (ref 80.0–100.0)
PLATELETS: 254 10*3/uL (ref 150–440)
RBC: 3.36 MIL/uL — ABNORMAL LOW (ref 3.80–5.20)
RDW: 17.8 % — ABNORMAL HIGH (ref 11.5–14.5)
WBC: 9.9 10*3/uL (ref 3.6–11.0)

## 2017-11-17 MED ORDER — GENTAMICIN SULFATE 40 MG/ML IJ SOLN
INTRAVENOUS | Status: DC
  Filled 2017-11-17 (×2): qty 13.5

## 2017-11-17 NOTE — H&P (View-Only) (Signed)
Patient ID: Michele [name], female   DOB: Jul 27, 1985, 32 y.o.   MRN: [id number]  Reason for Consult: No chief complaint on file.   Referred by No ref. provider found  Subjective:     HPI:  Michele [name] is a 32 y.o. female  She is feeling well. Presents today for a pre-operative visit.  pregnancy #5 Problems (from 05/25/17 to present)    Problem Noted Resolved   Supervision of high risk pregnancy, antepartum 06/14/2017 by Natale MilchSchuman, Christanna R, MD No   Overview Addendum 10/14/2017  3:49 PM by Natale MilchSchuman, Christanna R, MD      Clinic [facility] Prenatal Labs  Dating LMP = 5wk US Blood type: O/Positive/-- (03/26 1543)   Genetic Screen    NIPS: Normal XX   Antibody:Negative (03/26 1543)  Anatomic US complete Rubella: 3.12 (03/26 1543) Varicella: Immune  GTT Early:        28 wk: 116      RPR: Non Reactive (03/26 1543)   Rhogam  not indicated HBsAg: Negative (03/26 1543)   TDaP vaccine    08/30/17                   HIV: Non Reactive (03/26 1543)   Flu Shot       10/27/17                         GBS:   Contraception  BTL Pap: NIL HPV negative  CBB   Given information   CS/VBAC Repeat Cesareanx5 with BTL   Baby Food    Support Person             Anemia during pregnancy in third trimester 06/14/2017 by Natale MilchSchuman, Christanna R, MD No   Marginal placenta previa 06/22/2017 by Natale MilchSchuman, Christanna R, MD 07/13/2017 by Natale MilchSchuman, Christanna R, MD   Overview Signed 06/22/2017  6:03 PM by Natale MilchSchuman, Christanna R, MD    Pelvic rest      Nausea and vomiting in pregnancy 06/14/2017 by Natale MilchSchuman, Christanna R, MD 07/13/2017 by Natale MilchSchuman, Christanna R, MD     OB History    Gravida  5   Para  4   Term  4   Preterm      AB      Living  4     SAB      TAB      Ectopic      Multiple      Live Births  4           Past Medical History:  Diagnosis Date  . Anemia during pregnancy in second trimester 06/14/2017  . Anxiety   . Depression    Family History  Problem Relation Age  of Onset  . Mental illness Father   . Depression Father   . Mental illness Sister   . Schizophrenia Sister   . Depression Mother   . Cancer - Cervical Paternal Grandmother [id number]   Past Surgical History:  Procedure Laterality Date  . CESAREAN SECTION     times 4  . KNEE SURGERY    . TONSILLECTOMY      Short Social History:  Social History   Tobacco Use  . Smoking status: Never Smoker  . Smokeless tobacco: Never Used  Substance Use Topics  . Alcohol use: No    Comment: rare    Allergies  Allergen Reactions  . Penicillins Hives    Has patient had a PCN reaction  causing immediate rash, facial/tongue/throat swelling, SOB or lightheadedness with hypotension:  no Has patient had a PCN reaction causing severe rash involving mucus membranes or skin necrosis: no Has patient had a PCN reaction that required hospitalization: no Has patient had a PCN reaction occurring within the last 10 years: no If all of the above answers are "NO", then may proceed with Cephalosporin use.   . Sulfa Antibiotics Hives    No current outpatient medications on file.   No current facility-administered medications for this visit.     Review of Systems  Constitutional: Negative for chills, fatigue, fever and unexpected weight change.  HENT: Negative for trouble swallowing.  Eyes: Negative for loss of vision.  Respiratory: Negative for cough, shortness of breath and wheezing.  Cardiovascular: Negative for chest pain, leg swelling, palpitations and syncope.  GI: Negative for abdominal pain, blood in stool, diarrhea, nausea and vomiting.  GU: Negative for difficulty urinating, dysuria, frequency and hematuria.  Musculoskeletal: Negative for back pain, leg pain and joint pain.  Skin: Negative for rash.  Neurological: Negative for dizziness, headaches, light-headedness, numbness and seizures.  Psychiatric: Negative for behavioral problem, confusion, depressed mood and sleep disturbance.          Objective:  Objective   There were no vitals filed for this visit. There is no height or weight on file to calculate BMI.  Physical Exam  Constitutional: She is oriented to person, place, and time. She appears well-developed and well-nourished.  HENT:  Head: Normocephalic and atraumatic.  Eyes: Pupils are equal, round, and reactive to light. EOM are normal.  Cardiovascular: Normal rate and regular rhythm.  Pulmonary/Chest: Effort normal. No respiratory distress.  Abdominal: Soft. She exhibits no distension. There is no tenderness. There is no guarding.  Neurological: She is alert and oriented to person, place, and time.  Skin: Skin is warm and dry.  Psychiatric: She has a normal mood and affect. Her behavior is normal. Judgment and thought content normal.  Nursing note and vitals reviewed.   Data: FHR 120s     Assessment/Plan:     32 yo G5P4004 1. Repeat cesarean delivery tomorrow 11/18/17 with bilateral tubal ligation.  Discussed risks and benefits in extensive detail with patient.     Adelene Idler MD [facility], [facility] 11/17/17 8:36 AM

## 2017-11-17 NOTE — Patient Instructions (Addendum)
Your procedure is scheduled on: Thursday 11/18/17.  You are to arrive in the [facility] at 5:30am. Enter through the Emergency Department.    Remember: Instructions that are not followed completely may result in serious medical risk, up to and including death, or upon the discretion of your surgeon and anesthesiologist your surgery may need to be rescheduled.     _X__ 1. Do not eat food after midnight the night before your procedure.                 No gum chewing or hard candies. You may drink clear liquids up to 2 hours                 before you are scheduled to arrive for your surgery- DO not drink clear                 liquids within 2 hours of the start of your surgery.                 Clear Liquids include:  water, apple juice without pulp, clear carbohydrate                 drink such as Clearfast or Gatorade, Black Coffee or Tea (Do not add                 anything to coffee or tea).  __X__2.  On the morning of surgery brush your teeth with toothpaste and water, you may rinse your mouth with mouthwash if you wish.  Do not swallow any toothpaste of mouthwash.     _X__ 3.  No Alcohol for 24 hours before or after surgery.   _X__ 4.  Do Not Smoke or use e-cigarettes For 24 Hours Prior to Your Surgery.                 Do not use any chewable tobacco products for at least 6 hours prior to                 surgery.  ____  5.  Bring all medications with you on the day of surgery if instructed.   __X__  6.  Notify your doctor if there is any change in your medical condition      (cold, fever, infections).     Do not wear jewelry, make-up, hairpins, clips or nail polish. Do not wear lotions, powders, or perfumes. You may wear deodorant. Do not shave 48 hours prior to surgery.  Do not bring valuables to the hospital.    [facility] is not responsible for any belongings or valuables.  Contacts, dentures/partials or body piercings may not be worn into surgery. Bring a case for your  contacts, glasses or hearing aids, a denture cup will be supplied. Leave your suitcase in the car. After surgery it may be brought to your room. For patients admitted to the hospital, discharge time is determined by your treatment team.   Patients discharged the day of surgery will not be allowed to drive home.   Please read over the following fact sheets that you were given:   MRSA Information  __X__ Take these medicines the morning of surgery with A SIP OF WATER:     1. NO MEDICATIONS   2.  3.   4.  5.  6.     __X__ Use SAGE wipes as directed   __X__ Stop Anti-inflammatories 7 days before surgery such as Advil, Ibuprofen, Motrin, BC or Goodies  Powder, Naprosyn, Naproxen, Aleve, Aspirin, Meloxicam. May take Tylenol if needed for pain or discomfort.

## 2017-11-17 NOTE — Progress Notes (Signed)
 Patient ID: Michele [name], female   DOB: 12/02/1985, 32 y.o.   MRN: [id number]  Reason for Consult: No chief complaint on file.   Referred by No ref. provider found  Subjective:     HPI:  Michele [name] is a 32 y.o. female  She is feeling well. Presents today for a pre-operative visit.  pregnancy #5 Problems (from 05/25/17 to present)    Problem Noted Resolved   Supervision of high risk pregnancy, antepartum 06/14/2017 by Schuman, Christanna R, MD No   Overview Addendum 10/14/2017  3:49 PM by Schuman, Christanna R, MD      Clinic [facility] Prenatal Labs  Dating LMP = 5wk US Blood type: O/Positive/-- (03/26 1543)   Genetic Screen    NIPS: Normal XX   Antibody:Negative (03/26 1543)  Anatomic US complete Rubella: 3.12 (03/26 1543) Varicella: Immune  GTT Early:        28 wk: 116      RPR: Non Reactive (03/26 1543)   Rhogam  not indicated HBsAg: Negative (03/26 1543)   TDaP vaccine    08/30/17                   HIV: Non Reactive (03/26 1543)   Flu Shot       10/27/17                         GBS:   Contraception  BTL Pap: NIL HPV negative  CBB   Given information   CS/VBAC Repeat Cesareanx5 with BTL   Baby Food    Support Person             Anemia during pregnancy in third trimester 06/14/2017 by Schuman, Christanna R, MD No   Marginal placenta previa 06/22/2017 by Schuman, Christanna R, MD 07/13/2017 by Schuman, Christanna R, MD   Overview Signed 06/22/2017  6:03 PM by Schuman, Christanna R, MD    Pelvic rest      Nausea and vomiting in pregnancy 06/14/2017 by Schuman, Christanna R, MD 07/13/2017 by Schuman, Christanna R, MD     OB History    Gravida  5   Para  4   Term  4   Preterm      AB      Living  4     SAB      TAB      Ectopic      Multiple      Live Births  4           Past Medical History:  Diagnosis Date  . Anemia during pregnancy in second trimester 06/14/2017  . Anxiety   . Depression    Family History  Problem Relation Age  of Onset  . Mental illness Father   . Depression Father   . Mental illness Sister   . Schizophrenia Sister   . Depression Mother   . Cancer - Cervical Paternal Grandmother 86   Past Surgical History:  Procedure Laterality Date  . CESAREAN SECTION     times 4  . KNEE SURGERY    . TONSILLECTOMY      Short Social History:  Social History   Tobacco Use  . Smoking status: Never Smoker  . Smokeless tobacco: Never Used  Substance Use Topics  . Alcohol use: No    Comment: rare    Allergies  Allergen Reactions  . Penicillins Hives    Has patient had a PCN reaction   causing immediate rash, facial/tongue/throat swelling, SOB or lightheadedness with hypotension:  no Has patient had a PCN reaction causing severe rash involving mucus membranes or skin necrosis: no Has patient had a PCN reaction that required hospitalization: no Has patient had a PCN reaction occurring within the last 10 years: no If all of the above answers are "NO", then may proceed with Cephalosporin use.   . Sulfa Antibiotics Hives    No current outpatient medications on file.   No current facility-administered medications for this visit.     Review of Systems  Constitutional: Negative for chills, fatigue, fever and unexpected weight change.  HENT: Negative for trouble swallowing.  Eyes: Negative for loss of vision.  Respiratory: Negative for cough, shortness of breath and wheezing.  Cardiovascular: Negative for chest pain, leg swelling, palpitations and syncope.  GI: Negative for abdominal pain, blood in stool, diarrhea, nausea and vomiting.  GU: Negative for difficulty urinating, dysuria, frequency and hematuria.  Musculoskeletal: Negative for back pain, leg pain and joint pain.  Skin: Negative for rash.  Neurological: Negative for dizziness, headaches, light-headedness, numbness and seizures.  Psychiatric: Negative for behavioral problem, confusion, depressed mood and sleep disturbance.          Objective:  Objective   There were no vitals filed for this visit. There is no height or weight on file to calculate BMI.  Physical Exam  Constitutional: She is oriented to person, place, and time. She appears well-developed and well-nourished.  HENT:  Head: Normocephalic and atraumatic.  Eyes: Pupils are equal, round, and reactive to light. EOM are normal.  Cardiovascular: Normal rate and regular rhythm.  Pulmonary/Chest: Effort normal. No respiratory distress.  Abdominal: Soft. She exhibits no distension. There is no tenderness. There is no guarding.  Neurological: She is alert and oriented to person, place, and time.  Skin: Skin is warm and dry.  Psychiatric: She has a normal mood and affect. Her behavior is normal. Judgment and thought content normal.  Nursing note and vitals reviewed.   Data: FHR 120s     Assessment/Plan:     32 yo [id number] 1. Repeat cesarean delivery tomorrow 11/18/17 with bilateral tubal ligation.  Discussed risks and benefits in extensive detail with patient.     Christanna Schuman MD [facility], [facility] 11/17/17 8:36 AM   

## 2017-11-18 ENCOUNTER — Inpatient Hospital Stay: Payer: Medicaid Other | Admitting: Anesthesiology

## 2017-11-18 ENCOUNTER — Inpatient Hospital Stay
Admission: RE | Admit: 2017-11-18 | Discharge: 2017-11-20 | DRG: 784 | Disposition: A | Discharge status: Home / Self Care | Payer: Medicaid Other | Attending: Obstetrics and Gynecology | Admitting: Obstetrics and Gynecology

## 2017-11-18 ENCOUNTER — Encounter
Admission: RE | Disposition: A | Discharge status: Home / Self Care | Payer: Self-pay | Source: Home / Self Care | Attending: Obstetrics and Gynecology

## 2017-11-18 DIAGNOSIS — Z98891 History of uterine scar from previous surgery: Secondary | ICD-10-CM

## 2017-11-18 DIAGNOSIS — Z3A39 39 weeks gestation of pregnancy: Secondary | ICD-10-CM

## 2017-11-18 DIAGNOSIS — Z302 Encounter for sterilization: Secondary | ICD-10-CM

## 2017-11-18 DIAGNOSIS — O9081 Anemia of the puerperium: Secondary | ICD-10-CM | POA: Diagnosis not present

## 2017-11-18 DIAGNOSIS — O34219 Maternal care for unspecified type scar from previous cesarean delivery: Secondary | ICD-10-CM | POA: Diagnosis not present

## 2017-11-18 DIAGNOSIS — O34211 Maternal care for low transverse scar from previous cesarean delivery: Secondary | ICD-10-CM | POA: Diagnosis present

## 2017-11-18 DIAGNOSIS — O99013 Anemia complicating pregnancy, third trimester: Secondary | ICD-10-CM

## 2017-11-18 DIAGNOSIS — Z880 Allergy status to penicillin: Secondary | ICD-10-CM | POA: Diagnosis not present

## 2017-11-18 DIAGNOSIS — D62 Acute posthemorrhagic anemia: Secondary | ICD-10-CM | POA: Diagnosis not present

## 2017-11-18 DIAGNOSIS — O0990 Supervision of high risk pregnancy, unspecified, unspecified trimester: Secondary | ICD-10-CM

## 2017-11-18 LAB — TYPE AND SCREEN
ABO/RH(D): O POS
Antibody Screen: NEGATIVE
EXTEND SAMPLE REASON: UNDETERMINED
UNIT DIVISION: 0
Unit division: 0

## 2017-11-18 LAB — BPAM RBC
BLOOD PRODUCT EXPIRATION DATE: 201910182359
Blood Product Expiration Date: 201910172359
UNIT TYPE AND RH: 5100
Unit Type and Rh: 5100

## 2017-11-18 LAB — PREPARE RBC (CROSSMATCH)

## 2017-11-18 SURGERY — Surgical Case
Anesthesia: Spinal

## 2017-11-18 MED ORDER — HYDROCORTISONE NA SUCCINATE PF 100 MG IJ SOLR
INTRAMUSCULAR | Status: AC
  Filled 2017-11-18: qty 2

## 2017-11-18 MED ORDER — OXYCODONE-ACETAMINOPHEN 5-325 MG PO TABS
2.0000 | ORAL_TABLET | ORAL | Status: DC | PRN
  Administered 2017-11-19: 2 via ORAL
  Filled 2017-11-18: qty 2

## 2017-11-18 MED ORDER — FENTANYL CITRATE (PF) 100 MCG/2ML IJ SOLN
INTRAMUSCULAR | Status: DC | PRN
  Administered 2017-11-18: 15 ug via INTRATHECAL

## 2017-11-18 MED ORDER — PROPOFOL 10 MG/ML IV BOLUS
INTRAVENOUS | Status: AC
  Filled 2017-11-18: qty 20

## 2017-11-18 MED ORDER — DIBUCAINE 1 % RE OINT: 1.0000 "application " | TOPICAL_OINTMENT | RECTAL | Status: DC | PRN

## 2017-11-18 MED ORDER — NALBUPHINE HCL 10 MG/ML IJ SOLN: 5.0000 mg | Freq: Once | INTRAMUSCULAR | Status: DC | PRN

## 2017-11-18 MED ORDER — MEPERIDINE HCL 25 MG/ML IJ SOLN: 6.2500 mg | INTRAMUSCULAR | Status: DC | PRN

## 2017-11-18 MED ORDER — PRENATAL MULTIVITAMIN CH
1.0000 | ORAL_TABLET | Freq: Every day | ORAL | Status: DC
  Administered 2017-11-19 – 2017-11-20 (×2): 1 via ORAL
  Filled 2017-11-18: qty 1

## 2017-11-18 MED ORDER — OXYTOCIN 40 UNITS IN LACTATED RINGERS INFUSION - SIMPLE MED
INTRAVENOUS | Status: DC | PRN
  Administered 2017-11-18: 800 mL via INTRAVENOUS

## 2017-11-18 MED ORDER — LACTATED RINGERS IV SOLN: INTRAVENOUS | Status: DC

## 2017-11-18 MED ORDER — ROCURONIUM BROMIDE 50 MG/5ML IV SOLN
INTRAVENOUS | Status: AC
Start: 2017-11-18 — End: ?
  Filled 2017-11-18: qty 1

## 2017-11-18 MED ORDER — FLEET ENEMA 7-19 GM/118ML RE ENEM: 1.0000 | ENEMA | Freq: Every day | RECTAL | Status: DC | PRN

## 2017-11-18 MED ORDER — CLINDAMYCIN PHOSPHATE 900 MG/50ML IV SOLN
900.0000 mg | INTRAVENOUS | Status: AC
  Administered 2017-11-18: 900 mg via INTRAVENOUS
  Filled 2017-11-18: qty 50

## 2017-11-18 MED ORDER — LIDOCAINE HCL (PF) 2 % IJ SOLN
INTRAMUSCULAR | Status: AC
  Filled 2017-11-18: qty 10

## 2017-11-18 MED ORDER — EPHEDRINE SULFATE 50 MG/ML IJ SOLN
INTRAMUSCULAR | Status: DC | PRN
  Administered 2017-11-18 (×2): 10 mg via INTRAVENOUS
  Administered 2017-11-18: 20 mg via INTRAVENOUS

## 2017-11-18 MED ORDER — LACTATED RINGERS IV SOLN
INTRAVENOUS | Status: DC
  Administered 2017-11-18 (×2): via INTRAVENOUS

## 2017-11-18 MED ORDER — NALBUPHINE HCL 10 MG/ML IJ SOLN: 5.0000 mg | INTRAMUSCULAR | Status: DC | PRN

## 2017-11-18 MED ORDER — DIPHENHYDRAMINE HCL 50 MG/ML IJ SOLN
INTRAMUSCULAR | Status: AC
  Filled 2017-11-18: qty 1

## 2017-11-18 MED ORDER — ONDANSETRON HCL 4 MG/2ML IJ SOLN
INTRAMUSCULAR | Status: AC
  Filled 2017-11-18: qty 4

## 2017-11-18 MED ORDER — SODIUM CHLORIDE 0.9 % IJ SOLN
INTRAMUSCULAR | Status: DC | PRN
  Administered 2017-11-18: 50 mL

## 2017-11-18 MED ORDER — ONDANSETRON HCL 4 MG/2ML IJ SOLN: 4.0000 mg | Freq: Three times a day (TID) | INTRAMUSCULAR | Status: DC | PRN

## 2017-11-18 MED ORDER — SENNOSIDES-DOCUSATE SODIUM 8.6-50 MG PO TABS
2.0000 | ORAL_TABLET | ORAL | Status: DC
  Administered 2017-11-19 – 2017-11-20 (×2): 2 via ORAL
  Filled 2017-11-18 (×2): qty 2

## 2017-11-18 MED ORDER — SCOPOLAMINE 1 MG/3DAYS TD PT72: 1.0000 | MEDICATED_PATCH | Freq: Once | TRANSDERMAL | Status: DC

## 2017-11-18 MED ORDER — COCONUT OIL OIL: 1.0000 "application " | TOPICAL_OIL | Status: DC | PRN

## 2017-11-18 MED ORDER — NALOXONE HCL 0.4 MG/ML IJ SOLN: 0.4000 mg | INTRAMUSCULAR | Status: DC | PRN

## 2017-11-18 MED ORDER — HYDROCORTISONE NA SUCCINATE PF 1000 MG IJ SOLR
INTRAMUSCULAR | Status: DC | PRN
  Administered 2017-11-18: 50 mg via INTRAVENOUS

## 2017-11-18 MED ORDER — OXYTOCIN 40 UNITS IN LACTATED RINGERS INFUSION - SIMPLE MED
INTRAVENOUS | Status: AC
  Administered 2017-11-18: 62.5 mL/h
  Filled 2017-11-18: qty 1000

## 2017-11-18 MED ORDER — IBUPROFEN 600 MG PO TABS
600.0000 mg | ORAL_TABLET | Freq: Four times a day (QID) | ORAL | Status: DC
  Administered 2017-11-18 – 2017-11-20 (×8): 600 mg via ORAL
  Filled 2017-11-18 (×7): qty 1

## 2017-11-18 MED ORDER — DIPHENHYDRAMINE HCL 50 MG/ML IJ SOLN
INTRAMUSCULAR | Status: DC | PRN
  Administered 2017-11-18: 10 mg via INTRAVENOUS
  Administered 2017-11-18 (×2): 20 mg via INTRAVENOUS

## 2017-11-18 MED ORDER — SODIUM CHLORIDE 0.9% FLUSH: 3.0000 mL | INTRAVENOUS | Status: DC | PRN

## 2017-11-18 MED ORDER — MORPHINE SULFATE (PF) 0.5 MG/ML IJ SOLN
INTRAMUSCULAR | Status: DC | PRN
  Administered 2017-11-18: 1.9 mg via INTRAVENOUS
  Administered 2017-11-18: .1 mg via INTRATHECAL

## 2017-11-18 MED ORDER — OXYTOCIN 40 UNITS IN LACTATED RINGERS INFUSION - SIMPLE MED
INTRAVENOUS | Status: AC
  Filled 2017-11-18: qty 1000

## 2017-11-18 MED ORDER — BUPIVACAINE LIPOSOME 1.3 % IJ SUSP
20.0000 mL | Freq: Once | INTRAMUSCULAR | Status: DC
  Filled 2017-11-18: qty 20

## 2017-11-18 MED ORDER — SIMETHICONE 80 MG PO CHEW
80.0000 mg | CHEWABLE_TABLET | Freq: Three times a day (TID) | ORAL | Status: DC
  Administered 2017-11-18 – 2017-11-20 (×4): 80 mg via ORAL
  Filled 2017-11-18 (×5): qty 1

## 2017-11-18 MED ORDER — SOD CITRATE-CITRIC ACID 500-334 MG/5ML PO SOLN
30.0000 mL | ORAL | Status: AC
  Administered 2017-11-18: 30 mL via ORAL
  Filled 2017-11-18: qty 15

## 2017-11-18 MED ORDER — DEXAMETHASONE SODIUM PHOSPHATE 10 MG/ML IJ SOLN
INTRAMUSCULAR | Status: DC | PRN
  Administered 2017-11-18: 10 mg via INTRAVENOUS

## 2017-11-18 MED ORDER — OXYCODONE-ACETAMINOPHEN 5-325 MG PO TABS
1.0000 | ORAL_TABLET | ORAL | Status: DC | PRN
  Administered 2017-11-19 – 2017-11-20 (×4): 1 via ORAL
  Filled 2017-11-18 (×6): qty 1

## 2017-11-18 MED ORDER — LACTATED RINGERS IV SOLN
Freq: Once | INTRAVENOUS | Status: AC
  Administered 2017-11-18: 06:00:00 via INTRAVENOUS

## 2017-11-18 MED ORDER — SIMETHICONE 80 MG PO CHEW: 80.0000 mg | CHEWABLE_TABLET | ORAL | Status: DC

## 2017-11-18 MED ORDER — ACETAMINOPHEN 325 MG PO TABS: 325.0000 mg | ORAL_TABLET | ORAL | Status: DC | PRN

## 2017-11-18 MED ORDER — ACETAMINOPHEN 10 MG/ML IV SOLN
INTRAVENOUS | Status: DC | PRN
  Administered 2017-11-18: 1000 mg via INTRAVENOUS

## 2017-11-18 MED ORDER — DIPHENHYDRAMINE HCL 50 MG/ML IJ SOLN: 12.5000 mg | INTRAMUSCULAR | Status: DC | PRN

## 2017-11-18 MED ORDER — NALOXONE HCL 4 MG/10ML IJ SOLN
1.0000 ug/kg/h | INTRAVENOUS | Status: DC | PRN
  Filled 2017-11-18: qty 5

## 2017-11-18 MED ORDER — PRENATAL MULTIVITAMIN CH: 1.0000 | ORAL_TABLET | Freq: Every day | ORAL | Status: DC

## 2017-11-18 MED ORDER — FENTANYL CITRATE (PF) 100 MCG/2ML IJ SOLN
INTRAMUSCULAR | Status: AC
  Filled 2017-11-18: qty 2

## 2017-11-18 MED ORDER — BUPIVACAINE IN DEXTROSE 0.75-8.25 % IT SOLN
INTRATHECAL | Status: DC | PRN
  Administered 2017-11-18: 1.6 mL via INTRATHECAL

## 2017-11-18 MED ORDER — DEXAMETHASONE SODIUM PHOSPHATE 10 MG/ML IJ SOLN
INTRAMUSCULAR | Status: AC
  Filled 2017-11-18: qty 1

## 2017-11-18 MED ORDER — WITCH HAZEL-GLYCERIN EX PADS: 1.0000 "application " | MEDICATED_PAD | CUTANEOUS | Status: DC | PRN

## 2017-11-18 MED ORDER — BUPIVACAINE HCL (PF) 0.5 % IJ SOLN
INTRAMUSCULAR | Status: AC
  Filled 2017-11-18: qty 30

## 2017-11-18 MED ORDER — SODIUM CHLORIDE 0.9 % IJ SOLN
INTRAMUSCULAR | Status: AC
  Filled 2017-11-18: qty 50

## 2017-11-18 MED ORDER — SODIUM CHLORIDE 0.9% IV SOLUTION: Freq: Once | INTRAVENOUS | Status: DC

## 2017-11-18 MED ORDER — EPHEDRINE SULFATE 50 MG/ML IJ SOLN
INTRAMUSCULAR | Status: AC
  Filled 2017-11-18: qty 1

## 2017-11-18 MED ORDER — ACETAMINOPHEN 325 MG PO TABS
650.0000 mg | ORAL_TABLET | ORAL | Status: DC | PRN
  Administered 2017-11-18 – 2017-11-19 (×2): 650 mg via ORAL
  Filled 2017-11-18 (×2): qty 2

## 2017-11-18 MED ORDER — SIMETHICONE 80 MG PO CHEW
80.0000 mg | CHEWABLE_TABLET | ORAL | Status: DC | PRN
  Administered 2017-11-18 – 2017-11-19 (×2): 80 mg via ORAL
  Filled 2017-11-18: qty 1

## 2017-11-18 MED ORDER — DIPHENHYDRAMINE HCL 25 MG PO CAPS: 25.0000 mg | ORAL_CAPSULE | ORAL | Status: DC | PRN

## 2017-11-18 MED ORDER — PROMETHAZINE HCL 25 MG/ML IJ SOLN: 6.2500 mg | INTRAMUSCULAR | Status: DC | PRN

## 2017-11-18 MED ORDER — FERROUS SULFATE 325 (65 FE) MG PO TABS
325.0000 mg | ORAL_TABLET | Freq: Two times a day (BID) | ORAL | Status: DC
  Administered 2017-11-19 – 2017-11-20 (×2): 325 mg via ORAL
  Filled 2017-11-18 (×3): qty 1

## 2017-11-18 MED ORDER — BISACODYL 10 MG RE SUPP
10.0000 mg | Freq: Every day | RECTAL | Status: DC | PRN
  Administered 2017-11-20: 10 mg via RECTAL
  Filled 2017-11-18: qty 1

## 2017-11-18 MED ORDER — LIDOCAINE HCL (PF) 1 % IJ SOLN
INTRAMUSCULAR | Status: DC | PRN
  Administered 2017-11-18: 3 mL via SUBCUTANEOUS

## 2017-11-18 MED ORDER — GLYCOPYRROLATE 0.2 MG/ML IJ SOLN
INTRAMUSCULAR | Status: AC
  Filled 2017-11-18: qty 1

## 2017-11-18 MED ORDER — ZOLPIDEM TARTRATE 5 MG PO TABS: 5.0000 mg | ORAL_TABLET | Freq: Every evening | ORAL | Status: DC | PRN

## 2017-11-18 MED ORDER — SODIUM CHLORIDE 0.9 % IV SOLN
INTRAVENOUS | Status: DC | PRN
  Administered 2017-11-18: 25 ug/min via INTRAVENOUS

## 2017-11-18 MED ORDER — ACETAMINOPHEN 10 MG/ML IV SOLN
INTRAVENOUS | Status: AC
  Filled 2017-11-18: qty 100

## 2017-11-18 MED ORDER — ACETAMINOPHEN 160 MG/5ML PO SOLN
325.0000 mg | ORAL | Status: DC | PRN
  Filled 2017-11-18: qty 20.3

## 2017-11-18 MED ORDER — GENTAMICIN SULFATE 40 MG/ML IJ SOLN
540.0000 mg | INTRAVENOUS | Status: AC
  Administered 2017-11-18: 540 mg via INTRAVENOUS
  Filled 2017-11-18: qty 13.5

## 2017-11-18 MED ORDER — ONDANSETRON HCL 4 MG/2ML IJ SOLN
INTRAMUSCULAR | Status: DC | PRN
  Administered 2017-11-18: 4 mg via INTRAVENOUS

## 2017-11-18 MED ORDER — DIPHENHYDRAMINE HCL 25 MG PO CAPS: 25.0000 mg | ORAL_CAPSULE | Freq: Four times a day (QID) | ORAL | Status: DC | PRN

## 2017-11-18 MED ORDER — BUPIVACAINE LIPOSOME 1.3 % IJ SUSP
INTRAMUSCULAR | Status: DC | PRN
  Administered 2017-11-18: 50 mL

## 2017-11-18 MED ORDER — MORPHINE SULFATE (PF) 0.5 MG/ML IJ SOLN
INTRAMUSCULAR | Status: AC
  Filled 2017-11-18: qty 10

## 2017-11-18 MED ORDER — OXYTOCIN 40 UNITS IN LACTATED RINGERS INFUSION - SIMPLE MED
2.5000 [IU]/h | INTRAVENOUS | Status: AC
  Filled 2017-11-18: qty 1000

## 2017-11-18 MED ORDER — MENTHOL 3 MG MT LOZG
1.0000 | LOZENGE | OROMUCOSAL | Status: DC | PRN
  Filled 2017-11-18: qty 9

## 2017-11-18 SURGICAL SUPPLY — 36 items
ADH SKN CLS APL DERMABOND .7 (GAUZE/BANDAGES/DRESSINGS) ×1
CANISTER SUCT 3000ML PPV (MISCELLANEOUS) ×3 IMPLANT
CHLORAPREP W/TINT 26ML (MISCELLANEOUS) ×6 IMPLANT
DERMABOND ADVANCED (GAUZE/BANDAGES/DRESSINGS) ×2
DERMABOND ADVANCED .7 DNX12 (GAUZE/BANDAGES/DRESSINGS) ×1 IMPLANT
DRSG OPSITE POSTOP 4X10 (GAUZE/BANDAGES/DRESSINGS) ×3 IMPLANT
ELECT CAUTERY BLADE 6.4 (BLADE) ×3 IMPLANT
ELECT REM PT RETURN 9FT ADLT (ELECTROSURGICAL) ×3
ELECTRODE REM PT RTRN 9FT ADLT (ELECTROSURGICAL) ×1 IMPLANT
GLOVE BIOGEL PI IND STRL 6.5 (GLOVE) ×1 IMPLANT
GLOVE BIOGEL PI IND STRL 7.0 (GLOVE) IMPLANT
GLOVE BIOGEL PI INDICATOR 6.5 (GLOVE) ×2
GLOVE BIOGEL PI INDICATOR 7.0 (GLOVE) ×4
GLOVE SURG SYN 7.0 (GLOVE) ×6 IMPLANT
GLOVE SURG SYN 7.0 PF PI (GLOVE) IMPLANT
GOWN STRL REUS W/ TWL LRG LVL3 (GOWN DISPOSABLE) ×1 IMPLANT
GOWN STRL REUS W/ TWL XL LVL3 (GOWN DISPOSABLE) ×2 IMPLANT
GOWN STRL REUS W/TWL LRG LVL3 (GOWN DISPOSABLE) ×3
GOWN STRL REUS W/TWL XL LVL3 (GOWN DISPOSABLE) ×6
NEEDLE HYPO 22GX1.5 SAFETY (NEEDLE) ×4 IMPLANT
NS IRRIG 1000ML POUR BTL (IV SOLUTION) ×3 IMPLANT
PACK C SECTION AR (MISCELLANEOUS) ×3 IMPLANT
PAD OB MATERNITY 4.3X12.25 (PERSONAL CARE ITEMS) ×3 IMPLANT
PAD PREP 24X41 OB/GYN DISP (PERSONAL CARE ITEMS) ×3 IMPLANT
RETRACTOR TRAXI PANNICULUS (MISCELLANEOUS) IMPLANT
SUT CHROMIC 0 CT 1 (SUTURE) ×4 IMPLANT
SUT MNCRL 4-0 (SUTURE) ×3
SUT MNCRL 4-0 27XMFL (SUTURE) ×1
SUT MNCRL AB 4-0 PS2 18 (SUTURE) ×3 IMPLANT
SUT PDS AB 1 TP1 96 (SUTURE) ×2 IMPLANT
SUT PLAIN 3-0 (SUTURE) ×3 IMPLANT
SUT VIC AB 0 CT1 36 (SUTURE) ×7 IMPLANT
SUT VIC AB 2-0 CT1 36 (SUTURE) ×3 IMPLANT
SUTURE MNCRL 4-0 27XMF (SUTURE) IMPLANT
SYR 30ML LL (SYRINGE) ×6 IMPLANT
TRAXI PANNICULUS RETRACTOR (MISCELLANEOUS) ×2

## 2017-11-18 NOTE — Discharge Instructions (Signed)
Iron-Rich Diet Iron is a mineral that helps your body to produce hemoglobin. Hemoglobin is a protein in your red blood cells that carries oxygen to your body's tissues. Eating too little iron may cause you to feel weak and tired, and it can increase your risk for infection. Eating enough iron is necessary for your body's metabolism, muscle function, and nervous system. Iron is naturally found in many foods. It can also be added to foods or fortified in foods. There are two types of dietary iron:  Heme iron. Heme iron is absorbed by the body more easily than nonheme iron. Heme iron is found in meat, poultry, and fish.  Nonheme iron. Nonheme iron is found in dietary supplements, iron-fortified grains, beans, and vegetables.  You may need to follow an iron-rich diet if:  You have been diagnosed with iron deficiency or iron-deficiency anemia.  You have a condition that prevents you from absorbing dietary iron, such as: ? Infection in your intestines. ? Celiac disease. This involves long-lasting (chronic) inflammation of your intestines.  You do not eat enough iron.  You eat a diet that is high in foods that impair iron absorption.  You have lost a lot of blood.  You have heavy bleeding during your menstrual cycle.  You are pregnant.  What is my plan? Your health care provider may help you to determine how much iron you need per day based on your condition. Generally, when a person consumes sufficient amounts of iron in the diet, the following iron needs are met:  Men. ? 66-61 years old: 11 mg per day. ? 16-18 years old: 8 mg per day.  Women. ? 50-5 years old: 15 mg per day. ? 47-2 years old: 18 mg per day. ? Over 43 years old: 8 mg per day. ? Pregnant women: 27 mg per day. ? Breastfeeding women: 9 mg per day.  What do I need to know about an iron-rich diet?  Eat fresh fruits and vegetables that are high in vitamin C along with foods that are high in iron. This will help  increase the amount of iron that your body absorbs from food, especially with foods containing nonheme iron. Foods that are high in vitamin C include oranges, peppers, tomatoes, and mango.  Take iron supplements only as directed by your health care provider. Overdose of iron can be life-threatening. If you were prescribed iron supplements, take them with orange juice or a vitamin C supplement.  Cook foods in pots and pans that are made from iron.  Eat nonheme iron-containing foods alongside foods that are high in heme iron. This helps to improve your iron absorption.  Certain foods and drinks contain compounds that impair iron absorption. Avoid eating these foods in the same meal as iron-rich foods or with iron supplements. These include: ? Coffee, black tea, and red wine. ? Milk, dairy products, and foods that are high in calcium. ? Beans, soybeans, and peas. ? Whole grains.  When eating foods that contain both nonheme iron and compounds that impair iron absorption, follow these tips to absorb iron better. ? Soak beans overnight before cooking. ? Soak whole grains overnight and drain them before using. ? Ferment flours before baking, such as using yeast in bread dough. What foods can I eat? Grains Iron-fortified breakfast cereal. Iron-fortified whole-wheat bread. Enriched rice. Sprouted grains. Vegetables Spinach. Potatoes with skin. Green peas. Broccoli. Red and green bell peppers. Fermented vegetables. Fruits Prunes. Raisins. Oranges. Strawberries. Mango. Grapefruit. Meats and Other Protein Sources  Beef liver. Oysters. Beef. Shrimp. [place]. Chicken. [place]. Sardines. Chickpeas. Nuts. Tofu. Beverages Tomato juice. Fresh orange juice. Prune juice. Hibiscus tea. Fortified instant breakfast shakes. Condiments Tahini. Fermented soy sauce. Sweets and Desserts Black-strap molasses. Other Wheat germ. The items listed above may not be a complete list of recommended foods or beverages.  Contact your dietitian for more options. What foods are not recommended? Grains Whole grains. Bran cereal. Bran flour. Oats. Vegetables Artichokes. Brussels sprouts. Kale. Fruits Blueberries. Raspberries. Strawberries. Figs. Meats and Other Protein Sources Soybeans. Products made from soy protein. Dairy Milk. Cream. Cheese. Yogurt. Cottage cheese. Beverages Coffee. Black tea. Red wine. Sweets and Desserts Cocoa. Chocolate. Ice cream. Other Basil. Oregano. Parsley. The items listed above may not be a complete list of foods and beverages to avoid. Contact your dietitian for more information. This information is not intended to replace advice given to you by your health care provider. Make sure you discuss any questions you have with your health care provider. Document Released: 09/30/2004 Document Revised: 09/06/2015 Document Reviewed: 09/13/2013 Elsevier Interactive Patient Education  2018 [place]. Cesarean Delivery, Care After Refer to this sheet in the next few weeks. These instructions provide you with information about caring for yourself after your procedure. Your health care provider may also give you more specific instructions. Your treatment has been planned according to current medical practices, but problems sometimes occur. Call your health care provider if you have any problems or questions after your procedure. What can I expect after the procedure? After the procedure, it is common to have:  A small amount of blood or clear fluid coming from the incision.  Some redness, swelling, and pain in your incision area.  Some abdominal pain and soreness.  Vaginal bleeding (lochia).  Pelvic cramps.  Fatigue.  Follow these instructions at home: Incision care   Follow instructions from your health care provider about how to take care of your incision. Make sure you: ? Wash your hands with soap and water before you change your bandage (dressing). If soap and water  are not available, use hand sanitizer. ? If you have a dressing, change it as told by your health care provider. ? Leave stitches (sutures), skin staples, skin glue, or adhesive strips in place. These skin closures may need to stay in place for 2 weeks or longer. If adhesive strip edges start to loosen and curl up, you may trim the loose edges. Do not remove adhesive strips completely unless your health care provider tells you to do that.  Check your incision area every day for signs of infection. Check for: ? More redness, swelling, or pain. ? More fluid or blood. ? Warmth. ? Pus or a bad smell.  When you cough or sneeze, hug a pillow. This helps with pain and decreases the chance of your incision opening up (dehiscing). Do this until your incision heals. Medicines  Take over-the-counter and prescription medicines only as told by your health care provider.  If you were prescribed an antibiotic medicine, take it as told by your health care provider. Do not stop taking the antibiotic until it is finished. Driving  Do not drive or operate heavy machinery while taking prescription pain medicine. Lifestyle  Do not drink alcohol. This is especially important if you are breastfeeding or taking pain medicine.  Do not use tobacco products, including cigarettes, chewing tobacco, or e-cigarettes. If you need help quitting, ask your health care provider. Tobacco can delay wound healing. Eating and drinking  Drink  at least 8 eight-ounce glasses of water every day unless told not to by your health care provider. If you breastfeed, you may need to drink more water than this.  Eat high-fiber foods every day. These foods may help prevent or relieve constipation. High-fiber foods include: ? Whole grain cereals and breads. ? Brown rice. ? Beans. ? Fresh fruits and vegetables. Activity  Return to your normal activities as told by your health care provider. Ask your health care provider what  activities are safe for you.  Rest as much as possible. Try to rest or take a nap while your baby is sleeping.  Do not lift anything that is heavier than your baby or 10 lb (4.5 kg) as told by your health care provider.  Ask your health care provider when you can engage in sexual activity. This may depend on your: ? Risk of infection. ? Healing rate. ? Comfort and desire to engage in sexual activity. Bathing  Do not take baths, swim, or use a hot tub until your health care provider approves. Ask your health care provider if you can take showers. You may only be allowed to take sponge baths until your incision heals. General instructions  Do not use tampons or douches until your health care provider approves.  Wear: ? Loose, comfortable clothing. ? A supportive and well-fitting bra.  Watch for any blood clots that may pass from your vagina. These may look like clumps of dark red, brown, or black discharge.  Keep your perineum clean and dry as told by your health care provider.  Wipe from front to back when you use the toilet.  If possible, have someone help you care for your baby and help with household activities for a few days after you leave the hospital.  Keep all follow-up visits for you and your baby as told by your health care provider. This is important. Contact a health care provider if:  You have: ? Bad-smelling vaginal discharge. ? Difficulty urinating. ? Pain when urinating. ? A sudden increase or decrease in the frequency of your bowel movements. ? More redness, swelling, or pain around your incision. ? More fluid or blood coming from your incision. ? Pus or a bad smell coming from your incision. ? A fever. ? A rash. ? Little or no interest in activities you used to enjoy. ? Questions about caring for yourself or your baby. ? Nausea.  Your incision feels warm to the touch.  Your breasts turn red or become painful or hard.  You feel unusually sad or  worried.  You vomit.  You pass large blood clots from your vagina. If you pass a blood clot, save it to show to your health care provider. Do not flush blood clots down the toilet without showing your health care provider.  You urinate more than usual.  You are dizzy or light-headed.  You have not breastfed and have not had a menstrual period for 12 weeks after delivery.  You stopped breastfeeding and have not had a menstrual period for 12 weeks after stopping breastfeeding. Get help right away if:  You have: ? Pain that does not go away or get better with medicine. ? Chest pain. ? Difficulty breathing. ? Blurred vision or spots in your vision. ? Thoughts about hurting yourself or your baby. ? New pain in your abdomen or in one of your legs. ? A severe headache.  You faint.  You bleed from your vagina so much that you fill  two sanitary pads in one hour. This information is not intended to replace advice given to you by your health care provider. Make sure you discuss any questions you have with your health care provider. Document Released: 11/08/2001 Document Revised: 03/21/2016 Document Reviewed: 01/21/2015 Elsevier Interactive Patient Education  [employer].

## 2017-11-18 NOTE — Anesthesia Post-op Follow-up Note (Signed)
Anesthesia QCDR form completed.        

## 2017-11-18 NOTE — Interval H&P Note (Signed)
**Note [name]-Identified via Obfuscation** History and Physical Interval Note:  11/18/2017 7:28 AM  Michele [name] NurseMarie Snee  has presented today for surgery, with the diagnosis of HISTORY OF PRIOR CESAREAN,DESIRES STERILIZATION  The various methods of treatment have been discussed with the patient and family. After consideration of risks, benefits and other options for treatment, the patient has consented to  Procedure(s): CESAREAN SECTION WITH BILATERAL TUBAL LIGATION (N/A) as a surgical intervention .  The patient's history has been reviewed, patient examined, no change in status, stable for surgery.  I have reviewed the patient's chart and labs.  Questions were answered to the patient's satisfaction.     [name] R [name]

## 2017-11-18 NOTE — Anesthesia Preprocedure Evaluation (Signed)
Anesthesia Evaluation  Patient identified by MRN, date of birth, ID band Patient awake    Reviewed: Allergy & Precautions, H&P , NPO status , reviewed documented beta blocker date and time   History of Anesthesia Complications (+) PONV and history of anesthetic complications  Airway Mallampati: II  TM Distance: >3 FB Neck ROM: full    Dental  (+) Chipped, Missing   Pulmonary    Pulmonary exam normal        Cardiovascular Normal cardiovascular exam     Neuro/Psych PSYCHIATRIC DISORDERS Anxiety Depression    GI/Hepatic   Endo/Other    Renal/GU      Musculoskeletal   Abdominal   Peds  Hematology  (+) anemia ,   Anesthesia Other Findings Past Medical History: 06/14/2017: Anemia during pregnancy in second trimester No date: Anxiety No date: Depression No date: PONV (postoperative nausea and vomiting)  Past Surgical History: No date: CESAREAN SECTION     Comment:  times 4 No date: KNEE SURGERY No date: LAPAROSCOPIC ABDOMINAL EXPLORATION     Comment:  32 years old No date: TONSILLECTOMY  BMI    Body Mass Index:  41.20 kg/m      Reproductive/Obstetrics                             Anesthesia Physical Anesthesia Plan  ASA: II  Anesthesia Plan: Spinal   Post-op Pain Management:    Induction:   PONV Risk Score and Plan: 3 and Ondansetron  Airway Management Planned: Nasal Cannula and Natural Airway  Additional Equipment:   Intra-op Plan:   Post-operative Plan:   Informed Consent: I have reviewed the patients History and Physical, chart, labs and discussed the procedure including the risks, benefits and alternatives for the proposed anesthesia with the patient or authorized representative who has indicated his/her understanding and acceptance.   Dental Advisory Given  Plan Discussed with: CRNA  Anesthesia Plan Comments:         Anesthesia Quick Evaluation

## 2017-11-18 NOTE — Op Note (Signed)
Michele [name], Michele [name] available. Anesthesia: Spinal Estimated Blood Loss: 600 Complications: None; patient tolerated the procedure well.   Disposition: PACU - hemodynamically stable. Condition: stable   Findings: A live female infant in the cephalic presentation. Amniotic fluid - clear   Birth weight: 7 lbs 9oz Apgars of 9 and 9.  Intact placenta with a three-vessel cord. Grossly normal uterus, tubes and ovaries bilaterally. No intraabdominal adhesions were noted.   Procedure Details    The patient was taken to operating room, identified as the correct patient and the procedure verified as C-Section Delivery. A time out was held and the above information confirmed. After induction of anesthesia, the patient was draped and prepped in the usual sterile manner. A Pfannenstiel incision was made and carried down through the subcutaneous tissue to the fascia. Fascial incision was made and extended transversely with the Mayo scissors. The fascia was separated from the underlying rectus tissue superiorly and inferiorly. There was some adhessive disease at the rectus fascia. The peritoneum was identified and entered bluntly. The boviwe was used to carefully dissect scar tissue between the anterior uterus and the bladder. A bladder flap was created with blunt dissection . The bladder blade was inserted. A low transverse hysterotomy was made. The fetus was delivered atraumatically. The umbilical cord was clamped x2 and cut and the infant was handed to the awaiting pediatricians. The placenta was removed intact and appeared normal with a 3-vessel  cord. The uterus was exteriorized and cleared of all clot and debris. The hysterotomy was closed with running sutures of 0 Vicryl suture.  Excellent hemostasis was observed. A bilateral tubal ligation was performed. The right fallopian tube was elevated with the babcock clamp. 0-chromic suture was used to double suture ligate and create a knuckle of fallopian tube. A 1 cm portion of the fallopian tube was excised with the Metzenbaum scissors. The uterus was returned to the abdomen. The pelvis was irrigated and again, excellent hemostasis was noted. The tubal ligations sites were inspected and were hemostatic. The rectus fascia was then reapproximated with running sutures of looped PDS. Subcutaneous tissues are then irrigated with saline and hemostasis assured with the bovie. The subcutaneous fat was approximated with 3-0 plain and a running stitch.  The skin was then closed with 4-0 monocryl suture in a subcuticular fashion followed by skin adhesive. Exparil was injected along the incision site. Instrument, sponge, and needle counts were correct prior to the abdominal closure and at the conclusion of the case.  The patient tolerated the procedure well and was transferred to the recovery room in stable condition.   [name] MD [facility], [facility] 11/18/17 9:28 AM

## 2017-11-18 NOTE — Anesthesia Procedure Notes (Signed)
Spinal  Patient location during procedure: OR Start time: 11/18/2017 7:47 AM End time: 11/18/2017 7:57 AM Staffing Anesthesiologist: Christia ReadingHowell, Scott T, MD Resident/CRNA: Sherol DadeMacMang, Michele Guarnieri H, CRNA Performed: resident/CRNA  Preanesthetic Checklist Completed: patient identified, site marked, surgical consent, pre-op evaluation, timeout performed, IV checked, risks and benefits discussed and monitors and equipment checked Spinal Block Patient position: sitting Prep: ChloraPrep Patient monitoring: heart rate, continuous pulse ox and blood pressure Approach: midline Location: L3-4 Injection technique: single-shot Needle Needle type: Pencan and Introducer  Needle gauge: 24 G Assessment Sensory level: T4 Additional Notes Monitors and O2 placed; timeout complete prior to start of spinal placement. Pt tolerated well. See flowsheet for med doses.

## 2017-11-18 NOTE — Transfer of Care (Signed)
Immediate Anesthesia Transfer of Care Note  Patient: Michele [name]  Procedure(s) Performed: CESAREAN SECTION WITH BILATERAL TUBAL LIGATION (N/A )  Patient Location: PACU  Anesthesia Type:Spinal  Level of Consciousness: awake, alert , oriented and patient cooperative  Airway & Oxygen Therapy: Patient Spontanous Breathing  Post-op Assessment: Report given to RN, Post -op Vital signs reviewed and stable and Patient moving all extremities  Post vital signs: Reviewed and stable  Last Vitals:  Vitals Value Taken Time  BP    Temp    Pulse    Resp    SpO2      Last Pain:  Vitals:   11/18/17 0605  TempSrc: Oral  PainSc:          Complications: No apparent anesthesia complications

## 2017-11-18 NOTE — Lactation Note (Signed)
This note was copied from a baby's chart. Lactation Consultation Note  Patient Name: Michele [name] [id number]'s Date: 11/18/2017 Reason for consult: Initial assessment   Mother wants to pump her colostrum only.  Feeding Feeding Type: Bottle Fed - Formula Nipple Type: Slow - flow Length of feed: 10 min  LATCH Score                   Interventions    Lactation Tools Discussed/Used     Consult Status      Gilman SchmidtCarolyn P Alinah Sheard 11/18/2017, 1:49 PM

## 2017-11-19 ENCOUNTER — Telehealth: Payer: Self-pay

## 2017-11-19 ENCOUNTER — Encounter: Payer: Self-pay | Admitting: Obstetrics and Gynecology

## 2017-11-19 LAB — CBC
HCT: 25.1 % — ABNORMAL LOW (ref 35.0–47.0)
Hemoglobin: 8.8 g/dL — ABNORMAL LOW (ref 12.0–16.0)
MCH: 29.8 pg (ref 26.0–34.0)
MCHC: 35 g/dL (ref 32.0–36.0)
MCV: 85.3 fL (ref 80.0–100.0)
Platelets: 276 10*3/uL (ref 150–440)
RBC: 2.95 MIL/uL — ABNORMAL LOW (ref 3.80–5.20)
RDW: 17.5 % — ABNORMAL HIGH (ref 11.5–14.5)
WBC: 14.1 10*3/uL — AB (ref 3.6–11.0)

## 2017-11-19 NOTE — Anesthesia Post-op Follow-up Note (Signed)
  Anesthesia Pain Follow-up Note  Patient: Michele [name]  Day #: 1  Date of Follow-up: 11/19/2017 Time: 7:40 AM  Last Vitals:  Vitals:   11/18/17 2002 11/18/17 2226  BP: (!) 92/55 99/72  Pulse: 85 88  Resp: 20 18  Temp: 36.8 C 36.8 C  SpO2: 97% 96%    Level of Consciousness: alert  Pain: mild   Side Effects:None  Catheter Site Exam:clean, dry     Plan: [name]/C from anesthesia care at surgeon's request  Clydene PughBeane, Michele [name]

## 2017-11-19 NOTE — Progress Notes (Signed)
POD#1 rLTCS/BTL Subjective:  Appears fatigued. Endorses pain at incision site, improved some with medication. Able to tolerate a regular diet. Voiding and ambulating without difficulty.  Objective:  Blood pressure 95/65, pulse 77, temperature 98 F (36.7 C), temperature source Oral, resp. rate 18, height 5\' 4"  (1.626 m), weight 108.9 kg, last menstrual period 02/17/2017, SpO2 96 %,  currently breastfeeding.  General: NAD Pulmonary: no increased work of breathing Abdomen: non-distended, non-tender, fundus firm at level of umbilicus Incision: Dressing clean, dry, and intact Extremities: +1 edema BLE, no erythema, no tenderness  Results for orders placed or performed during the hospital encounter of 11/18/17 (from the past 72 hour(s))  Prepare RBC     Status: None   Collection Time: 11/18/17  7:37 AM  Result Value Ref Range   Order Confirmation      ORDER PROCESSED BY BLOOD BANK Performed at [facility], [address] [place], [place] [address]   CBC     Status: Abnormal   Collection Time: 11/19/17  5:36 AM  Result Value Ref Range   WBC 14.1 (H) 3.6 - 11.0 K/uL   RBC 2.95 (L) 3.80 - 5.20 MIL/uL   Hemoglobin 8.8 (L) 12.0 - 16.0 g/dL   HCT 60.425.1 (L) 54.035.0 - 98.147.0 %   MCV 85.3 80.0 - 100.0 fL   MCH 29.8 26.0 - 34.0 pg   MCHC 35.0 32.0 - 36.0 g/dL   RDW 19.117.5 (H) 47.811.5 - 29.514.5 %   Platelets 276 150 - 440 K/uL    Comment: Performed at [facility], [address] [place], [place] [address]     Assessment:   32 y.o. [id number] post-operative day #1 in good condition.  Plan:  1) Acute blood loss anemia - hemodynamically stable and asymptomatic - PO ferrous sulfate  2) Blood Type --/--/O POS (09/18 [id number]) / Rubella 3.12 (03/26 1543) / Varicella Immune  3) TDAP status: up to date, received 09/27/2017  4) Breast and formula feeding.  5) Contraception: BTL  6) Disposition: continue postpartum care, encourage ambulation.  Marcelyn BruinsJacelyn Shaunn Tackitt, CNM 11/19/2017  9:46 AM

## 2017-11-19 NOTE — Telephone Encounter (Signed)
FMLA/DISABILITY form for [employer] filled out, signature obtained, and given to [place] for processing. 

## 2017-11-19 NOTE — Anesthesia Postprocedure Evaluation (Signed)
Anesthesia Post Note  Patient: Michele [name]  Procedure(s) Performed: CESAREAN SECTION WITH BILATERAL TUBAL LIGATION (N/A )  Patient location during evaluation: Mother Baby Anesthesia Type: Spinal Level of consciousness: awake and alert and oriented Pain management: satisfactory to patient Vital Signs Assessment: post-procedure vital signs reviewed and stable Respiratory status: respiratory function stable Cardiovascular status: stable Postop Assessment: no headache, no backache, spinal receding, no apparent nausea or vomiting, patient able to bend at knees, adequate PO intake and able to ambulate Anesthetic complications: no     Last Vitals:  Vitals:   11/18/17 2002 11/18/17 2226  BP: (!) 92/55 99/72  Pulse: 85 88  Resp: 20 18  Temp: 36.8 C 36.8 C  SpO2: 97% 96%    Last Pain:  Vitals:   11/19/17 0639  TempSrc:   PainSc: 8                  Clydene PughBeane, [name]

## 2017-11-20 LAB — SURGICAL PATHOLOGY

## 2017-11-20 MED ORDER — OXYCODONE-ACETAMINOPHEN 5-325 MG PO TABS: 1.0000 | ORAL_TABLET | ORAL | 0 refills | Status: DC | PRN

## 2017-11-20 MED ORDER — FERROUS SULFATE 325 (65 FE) MG PO TABS: 325.0000 mg | ORAL_TABLET | Freq: Two times a day (BID) | ORAL | 0 refills | Status: AC

## 2017-11-20 NOTE — Progress Notes (Signed)
Discharge instructions provided.  Pt verbalizes understanding of all instructions and follow-up care.  Incision hygrine kit given.  Pt discharged to home with infant at 67 on 11/20/17 via wheelchair by NT. Reed Breech, RN 11/20/2017 1:50 PM

## 2017-11-20 NOTE — Discharge Summary (Signed)
OB Discharge Summary     Patient Name: Michele [name] DOB: Sep 24, 1985 MRN: [id number]  Date of admission: 11/18/2017 Delivering MD: [facility]  Date of Delivery: 11/18/2017  Date of discharge: 11/20/2017  Admitting diagnosis: HISTORY OF PRIOR CESAREAN,DESIRES STERILIZATION Intrauterine pregnancy: [redacted]w[redacted]d     Secondary diagnosis: None     Discharge diagnosis: Term Pregnancy Delivered, Reasons for cesarean section  Elective repeat and Destire for sterility                         Hospital course:  Sceduled C/S   32 y.o. yo G5P5005 at [redacted]w[redacted]d was admitted to the hospital 11/18/2017 for scheduled cesarean section with the following indication:Elective Repeat.  Membrane Rupture Time/Date: 8:27 AM ,11/18/2017   Patient delivered a Viable infant.11/18/2017  Details of operation can be found in separate operative note.  Pateint had an uncomplicated postpartum course.  She is ambulating, tolerating a regular diet, passing flatus, and urinating well. Patient is discharged home in stable condition on  11/20/17                                                                        Post partum procedures:none  Complications: None  Physical exam on 11/20/2017: Vitals:   11/19/17 1709 11/19/17 2358 11/20/17 0429 11/20/17 0755  BP: 110/62 (!) 105/55 (!) 94/52 106/68  Pulse: 68 85 74 84  Resp: 18 18 18 18   Temp: 98.7 F (37.1 C) 98.6 F (37 C) 98.4 F (36.9 C) 98.4 F (36.9 C)  TempSrc: Oral Oral Oral Oral  SpO2: 97% 98% 99% 100%  Weight:      Height:       General: alert, cooperative and no distress Lochia: appropriate Uterine Fundus: firm Incision: Healing well with no significant drainage, No significant erythema DVT Evaluation: No evidence of DVT seen on physical exam. Negative Homan's sign.  Labs: Lab Results  Component Value Date   WBC 14.1 (H) 11/19/2017   HGB 8.8 (L) 11/19/2017   HCT 25.1 (L) 11/19/2017   MCV 85.3 11/19/2017   PLT 276 11/19/2017   CMP Latest Ref Rng & Units  11/02/2017  Glucose 70 - 99 mg/dL 81  BUN 6 - 20 mg/dL 6  Creatinine 4.09 - 8.11 mg/dL 9.14  Sodium 782 - 956 mmol/L 135  Potassium 3.5 - 5.1 mmol/L 4.2  Chloride 98 - 111 mmol/L 105  CO2 22 - 32 mmol/L 26  Calcium 8.9 - 10.3 mg/dL 2.1(H)  Total Protein 6.5 - 8.1 g/dL 6.3(L)  Total Bilirubin 0.3 - 1.2 mg/dL 0.3  Alkaline Phos 38 - 126 U/L 146(H)  AST 15 - 41 U/L 22  ALT 0 - 44 U/L 17    Discharge instruction: per After Visit Summary.  Medications:  Allergies as of 11/20/2017      Reactions   Penicillins Hives   Has patient had a PCN reaction causing immediate rash, facial/tongue/throat swelling, SOB or lightheadedness with hypotension:  no Has patient had a PCN reaction causing severe rash involving mucus membranes or skin necrosis: no Has patient had a PCN reaction that required hospitalization: no Has patient had a PCN reaction occurring within the last 10 years: no If all of the above  answers are "NO", then may proceed with Cephalosporin use.   Sulfa Antibiotics Hives      Medication List    TAKE these medications   ferrous sulfate 325 (65 FE) MG tablet Take 1 tablet (325 mg total) by mouth 2 (two) times daily with a meal.   oxyCODONE-acetaminophen 5-325 MG tablet Commonly known as:  PERCOCET/ROXICET Take 1 tablet by mouth every 4 (four) hours as needed (pain scale 4-7).   prenatal multivitamin Tabs tablet Take 1 tablet by mouth daily at 12 noon.      Diet: routine diet  Activity: Advance as tolerated. Pelvic rest for 6 weeks.   Outpatient follow up: Follow-up Information    Schuman, Jaquelyn Bitterhristanna R, MD. Schedule an appointment as soon as possible for a visit in 1 week.   Specialty:  Obstetrics and Gynecology Contact information: [address] [place] [phone]            Postpartum contraception: Tubal Ligation Rhogam Given postpartum: no Rubella vaccine given postpartum: no Varicella vaccine given postpartum: no TDaP given  antepartum or postpartum: Yes  Newborn Data: Live born female  Birth Weight: 7 lb 9.3 oz (3440 g) APGAR: 9, 9  Newborn Delivery   Birth date/time:  11/18/2017 08:29:00 Delivery type:  C-Section, Low Transverse Trial of labor:  No C-section categorization:  Repeat   Baby Feeding: Breast  Disposition:home with mother  SIGNED: Letitia Libraobert Paul Harris, MD 11/20/2017 9:09 AM

## 2017-11-25 ENCOUNTER — Encounter: Payer: Self-pay | Admitting: Obstetrics and Gynecology

## 2017-11-25 ENCOUNTER — Ambulatory Visit (INDEPENDENT_AMBULATORY_CARE_PROVIDER_SITE_OTHER): Payer: Medicaid Other | Admitting: Obstetrics and Gynecology

## 2017-11-25 VITALS — BP 122/70 | HR 91 | Ht 64.0 in | Wt 222.0 lb

## 2017-11-25 DIAGNOSIS — Z9889 Other specified postprocedural states: Secondary | ICD-10-CM

## 2017-11-25 DIAGNOSIS — Z98891 History of uterine scar from previous surgery: Secondary | ICD-10-CM

## 2017-11-25 NOTE — Progress Notes (Signed)
  OBSTETRICS POSTPARTUM CLINIC PROGRESS NOTE  Subjective:     Michele [name] is a 32 y.o. [id number] female who presents for a postpartum visit. She is 1 week postpartum following a Term pregnancy and delivery by C-section.  I have fully reviewed the prenatal and intrapartum course. Anesthesia: spinal.  Postpartum course has been complicated by uncomplicated.  Baby is feeding by Bottle.  Bleeding: patient has not  resumed menses.  Bowel function is normal. Bladder function is normal.  Patient is not sexually active. Contraception method desired is tubal ligation.  Postpartum depression screening: negative.   The following portions of the patient's history were reviewed and updated as appropriate: allergies, current medications, past family history, past medical history, past social history, past surgical history and problem list.  Review of Systems Pertinent items are noted in HPI.  Objective:    BP 122/70   Pulse 91   Ht 5\' 4"  (1.626 m)   Wt 222 lb (100.7 kg)   Breastfeeding? No   BMI 38.11 kg/m   General:  alert and no distress   Breasts:  inspection negative, no nipple discharge or bleeding, no masses or nodularity palpable  Lungs: clear to auscultation bilaterally  Heart:  regular rate and rhythm, S1, S2 normal, no murmur, click, rub or gallop  Abdomen: soft, non-tender; bowel sounds normal; no masses,  no organomegaly.   Well healed Pfannenstiel incision   Vulva:  normal  Vagina: normal vagina, no discharge, exudate, lesion, or erythema  Cervix:  no cervical motion tenderness and no lesions  Corpus: normal size, contour, position, consistency, mobility, non-tender  Adnexa:  normal adnexa and no mass, fullness, tenderness  Rectal Exam: Not performed.          Assessment:  Post Partum Care visit 1. S/P cesarean section   2. Cesarean delivery delivered     Plan:  See orders and Patient Instructions Encouraged her to limit activities around the house. She is  cleaning and chasing after her older children a lot. Her significant other is willing to help.  Follow up in: 5 weeks or as needed.   Adelene Idler MD [facility], [facility] 11/25/17 11:11 AM

## 2017-11-26 ENCOUNTER — Ambulatory Visit: Payer: Medicaid Other | Admitting: Obstetrics and Gynecology

## 2017-12-14 ENCOUNTER — Telehealth: Payer: Self-pay

## 2017-12-14 NOTE — Telephone Encounter (Signed)
Pt states she delivered 11/18/17. She had stopped bleeding, but now is having brown d/c w/odor & she thinks she has a UTI. She had them thru out her pregnancy. Pt states she tried to get an appointment but there was nothing available til next week. Pt inquiring if rx for abx can be sent in. Cb#[phone].

## 2017-12-14 NOTE — Telephone Encounter (Signed)
LMVM to notify pt appointment needed to evaluate prior to treatment. we have openings tomorrow. Pt advised to return call to scheduled appointment.

## 2017-12-15 ENCOUNTER — Ambulatory Visit: Payer: Medicaid Other | Admitting: Obstetrics and Gynecology

## 2017-12-29 ENCOUNTER — Ambulatory Visit: Payer: Medicaid Other | Admitting: Obstetrics and Gynecology

## 2018-01-30 NOTE — Progress Notes (Deleted)
[facility]  [phone] [phone]  ID: Michele [name] OB: [birth year]  MR#: [id number]  AOZ#:[id number]  Patient Care Team: [facility] as PCP - General (General Practice)  CHIEF COMPLAINT: Anemia during pregnancy in the third trimester.  INTERVAL HISTORY: Patient returns to clinic today for repeat laboratory can further evaluation.  She continues to feel well and remains asymptomatic.  She does not complain of weakness or fatigue today.  She has no neurologic complaints.  She denies any recent fevers or illnesses.  She is gaining weight appropriately.  She has no chest pain or shortness of breath.  She denies any nausea, vomiting, constipation, or diarrhea.  She has no urinary complaints.  Patient offers no specific complaints today.  REVIEW OF SYSTEMS:   Review of Systems  Constitutional: Negative.  Negative for fever, malaise/fatigue and weight loss.  Respiratory: Negative.  Negative for cough and shortness of breath.   Cardiovascular: Negative.  Negative for chest pain and leg swelling.  Gastrointestinal: Negative.  Negative for abdominal pain, blood in stool, melena, nausea and vomiting.  Genitourinary: Negative.  Negative for hematuria.  Musculoskeletal: Negative.  Negative for back pain.  Skin: Negative.  Negative for rash.  Neurological: Negative.  Negative for sensory change, focal weakness, weakness and headaches.  Psychiatric/Behavioral: Negative.  The patient is not nervous/anxious.     As per HPI. Otherwise, a complete review of systems is negative.  PAST MEDICAL HISTORY: Past Medical History:  Diagnosis Date  . Anemia during pregnancy in second trimester 06/14/2017  . Anxiety   . Depression     PAST SURGICAL HISTORY: Past Surgical History:  Procedure Laterality Date  . CESAREAN SECTION     times 4  . CESAREAN SECTION WITH BILATERAL TUBAL LIGATION N/A 11/18/2017   Procedure: CESAREAN SECTION WITH  BILATERAL TUBAL LIGATION;  Surgeon: Natale Milch, MD;  Location: [facility] ORS;  Service: Obstetrics;  Laterality: N/A;  . KNEE SURGERY    . LAPAROSCOPIC ABDOMINAL EXPLORATION     32 years old  . TONSILLECTOMY      FAMILY HISTORY: Family History  Problem Relation Age of Onset  . Mental illness Father   . Depression Father   . Mental illness Sister   . Schizophrenia Sister   . Depression Mother   . Cancer - Cervical Paternal Grandmother 18    ADVANCED DIRECTIVES (Y/N):  N  HEALTH MAINTENANCE: Social History   Tobacco Use  . Smoking status: Never Smoker  . Smokeless tobacco: Never Used  Substance Use Topics  . Alcohol use: No    Comment: rare  . Drug use: No     Colonoscopy:  PAP:  Bone density:  Lipid panel:  Allergies  Allergen Reactions  . Penicillins Hives    Has patient had a PCN reaction causing immediate rash, facial/tongue/throat swelling, SOB or lightheadedness with hypotension:  no Has patient had a PCN reaction causing severe rash involving mucus membranes or skin necrosis: no Has patient had a PCN reaction that required hospitalization: no Has patient had a PCN reaction occurring within the last 10 years: no If all of the above answers are "NO", then may proceed with Cephalosporin use.   . Sulfa Antibiotics Hives    Current Outpatient Medications  Medication Sig Dispense Refill  . ferrous sulfate 325 (65 FE) MG tablet Take 1 tablet (325 mg total) by mouth 2 (two) times daily with a meal. 60 tablet 0  . oxyCODONE-acetaminophen (PERCOCET/ROXICET) 5-325 MG tablet Take  1 tablet by mouth every 4 (four) hours as needed (pain scale 4-7). (Patient not taking: Reported on 11/25/2017) 30 tablet 0  . Prenatal Vit-Fe Fumarate-FA (PRENATAL MULTIVITAMIN) TABS tablet Take 1 tablet by mouth daily at 12 noon.     No current facility-administered medications for this visit.     OBJECTIVE: There were no vitals filed for this visit.   There is no height or weight  on file to calculate BMI.    ECOG FS:0 - Asymptomatic  General: Well-developed, well-nourished, no acute distress. Eyes: Pink conjunctiva, anicteric sclera. HEENT: Normocephalic, moist mucous membranes, clear oropharnyx. Lungs: Clear to auscultation bilaterally. Heart: Regular rate and rhythm. No rubs, murmurs, or gallops. Abdomen: Appears appropriate for gestational age. Musculoskeletal: No edema, cyanosis, or clubbing. Neuro: Alert, answering all questions appropriately. Cranial nerves grossly intact. Skin: No rashes or petechiae noted. Psych: Normal affect.  LAB RESULTS:  Lab Results  Component Value Date   NA 135 11/02/2017   K 4.2 11/02/2017   CL 105 11/02/2017   CO2 26 11/02/2017   GLUCOSE 81 11/02/2017   BUN 6 11/02/2017   CREATININE 0.47 11/02/2017   CALCIUM 8.8 (L) 11/02/2017   PROT 6.3 (L) 11/02/2017   ALBUMIN 3.2 (L) 11/02/2017   AST 22 11/02/2017   ALT 17 11/02/2017   ALKPHOS 146 (H) 11/02/2017   BILITOT 0.3 11/02/2017   GFRNONAA >60 11/02/2017   GFRAA >60 11/02/2017    Lab Results  Component Value Date   WBC 14.1 (H) 11/19/2017   NEUTROABS 9.0 (H) 10/28/2017   HGB 8.8 (L) 11/19/2017   HCT 25.1 (L) 11/19/2017   MCV 85.3 11/19/2017   PLT 276 11/19/2017   Lab Results  Component Value Date   IRON 36 10/28/2017   TIBC 458 (H) 10/28/2017   IRONPCTSAT 8 (L) 10/28/2017   Lab Results  Component Value Date   FERRITIN 25 10/28/2017      STUDIES: No results found.  ASSESSMENT: Anemia during pregnancy in the third trimester.  PLAN:    1. Anemia during pregnancy in the third trimester: Patient's hemoglobin has only mildly improved and her iron stores remain significantly reduced.  She has been instructed to continue her oral iron supplementation.  Proceed with 1 additional infusion of 510 mg IV Feraheme today.  Patient C-section is scheduled in the next 2 weeks, therefore she will return to clinic in 3 months with repeat laboratory work and further  evaluation. 2.  Pregnancy: Scheduled C-section in mid September as above.    I spent a total of 30 minutes face-to-face with the patient of which greater than 50% of the visit was spent in counseling and coordination of care as detailed above.   Patient expressed understanding and was in agreement with this plan. She also understands that She can call clinic at any time with any questions, concerns, or complaints.    Jeralyn Ruthsimothy J Finnegan, MD   01/30/2018 2:13 PM

## 2018-01-31 ENCOUNTER — Other Ambulatory Visit: Payer: Self-pay | Admitting: *Deleted

## 2018-01-31 DIAGNOSIS — D649 Anemia, unspecified: Secondary | ICD-10-CM

## 2018-02-01 ENCOUNTER — Inpatient Hospital Stay: Payer: Self-pay

## 2018-02-01 ENCOUNTER — Inpatient Hospital Stay: Payer: Self-pay | Admitting: Oncology

## 2018-05-24 ENCOUNTER — Other Ambulatory Visit: Payer: Self-pay

## 2018-05-24 ENCOUNTER — Emergency Department
Admission: EM | Admit: 2018-05-24 | Discharge: 2018-05-24 | Disposition: A | Discharge status: Home / Self Care | Payer: BC Managed Care – PPO | Attending: Emergency Medicine | Admitting: Emergency Medicine

## 2018-05-24 ENCOUNTER — Encounter: Payer: Self-pay | Admitting: Intensive Care

## 2018-05-24 ENCOUNTER — Emergency Department: Payer: BC Managed Care – PPO

## 2018-05-24 DIAGNOSIS — R079 Chest pain, unspecified: Secondary | ICD-10-CM

## 2018-05-24 DIAGNOSIS — R030 Elevated blood-pressure reading, without diagnosis of hypertension: Secondary | ICD-10-CM | POA: Diagnosis not present

## 2018-05-24 DIAGNOSIS — Z79899 Other long term (current) drug therapy: Secondary | ICD-10-CM | POA: Insufficient documentation

## 2018-05-24 DIAGNOSIS — F419 Anxiety disorder, unspecified: Secondary | ICD-10-CM | POA: Diagnosis not present

## 2018-05-24 LAB — CBC
HCT: 32.2 % — ABNORMAL LOW (ref 36.0–46.0)
Hemoglobin: 10.5 g/dL — ABNORMAL LOW (ref 12.0–15.0)
MCH: 27.6 pg (ref 26.0–34.0)
MCHC: 32.6 g/dL (ref 30.0–36.0)
MCV: 84.5 fL (ref 80.0–100.0)
Platelets: 340 10*3/uL (ref 150–400)
RBC: 3.81 MIL/uL — ABNORMAL LOW (ref 3.87–5.11)
RDW: 12.3 % (ref 11.5–15.5)
WBC: 8.1 10*3/uL (ref 4.0–10.5)
nRBC: 0 % (ref 0.0–0.2)

## 2018-05-24 LAB — BASIC METABOLIC PANEL
Anion gap: 8 (ref 5–15)
BUN: 9 mg/dL (ref 6–20)
CO2: 29 mmol/L (ref 22–32)
Calcium: 9.3 mg/dL (ref 8.9–10.3)
Chloride: 101 mmol/L (ref 98–111)
Creatinine, Ser: 0.69 mg/dL (ref 0.44–1.00)
GFR calc non Af Amer: >60 mL/min (ref >60)
Glucose, Bld: 76 mg/dL (ref 70–99)
Potassium: 3.8 mmol/L (ref 3.5–5.1)
Sodium: 138 mmol/L (ref 135–145)

## 2018-05-24 LAB — TROPONIN I: Troponin I: <0.03 ng/mL (ref <0.03)

## 2018-05-24 MED ORDER — LORAZEPAM 1 MG PO TABS: 1.0000 mg | ORAL_TABLET | Freq: Four times a day (QID) | ORAL | 0 refills | Status: AC | PRN

## 2018-05-24 MED ORDER — LORAZEPAM 1 MG PO TABS
1.0000 mg | ORAL_TABLET | Freq: Once | ORAL | Status: AC
  Administered 2018-05-24: 1 mg via ORAL
  Filled 2018-05-24: qty 1

## 2018-05-24 NOTE — ED Triage Notes (Signed)
Patient c/o chest tightness that started yesterday and continually gotten worse today that radiates to back. Also reports b/p was checked at work and was high. HX anxiety. Tearful during triage

## 2018-05-24 NOTE — ED Provider Notes (Signed)
[facility] Emergency Department Provider Note  ____________________________________________  Time seen: Approximately 5:07 PM  I have reviewed the triage vital signs and the nursing notes.   HISTORY  Chief Complaint Hypertension and Chest Pain    HPI Michele [name] is a 33 y.o. female with a history of anxiety not currently on any medication, major depressive disorder, presenting for chest pain and anxiety.  The patient reports that for the past 2 days, she has had an intermittent chest tightness which is mostly up high near her clavicles and sometimes left-sided.  This began yesterday during a period of time that she felt very anxious; she describes many new stressors in her life at this time.  She has associated shortness of breath with a headache when this occurs.  She has no diaphoresis, radiation of pain, pleuritic component to her chest pain, or extremity edema or calf pain.  Past Medical History:  Diagnosis Date  . Anemia during pregnancy in second trimester 06/14/2017  . Anxiety   . Depression     Patient Active Problem List   Diagnosis Date Noted  . Cesarean delivery delivered 11/18/2017  . S/P cesarean section 11/18/2017  . Morbid obesity with BMI of 40.0-44.9, adult (HCC) 11/03/2017  . Restless leg syndrome 06/14/2017  . Primary insomnia 06/14/2017  . Anemia during pregnancy in third trimester 06/14/2017  . Major depressive disorder, recurrent, severe without psychotic features (HCC)   . Intentional opiate overdose (HCC) 09/14/2014  . Suicidal ideation 09/14/2014    Past Surgical History:  Procedure Laterality Date  . CESAREAN SECTION     times 4  . CESAREAN SECTION WITH BILATERAL TUBAL LIGATION N/A 11/18/2017   Procedure: CESAREAN SECTION WITH BILATERAL TUBAL LIGATION;  Surgeon: Natale Milch, MD;  Location: [facility] ORS;  Service: Obstetrics;  Laterality: N/A;  . KNEE SURGERY    . LAPAROSCOPIC ABDOMINAL EXPLORATION     33  years old  . TONSILLECTOMY      Current Outpatient Rx  . Order #: [id number] Class: Normal  . Order #: [id number] Class: Print  . Order #: [id number] Class: Normal  . Order #: [id number] Class: Historical Med    Allergies Penicillins and Sulfa antibiotics  Family History  Problem Relation Age of Onset  . Mental illness Father   . Depression Father   . Mental illness Sister   . Schizophrenia Sister   . Depression Mother   . Cancer - Cervical Paternal Grandmother 66    Social History Social History   Tobacco Use  . Smoking status: Never Smoker  . Smokeless tobacco: Never Used  Substance Use Topics  . Alcohol use: No    Comment: rare  . Drug use: No    Review of Systems Constitutional: No fever/chills.  Lightheadedness or syncope.  No diaphoresis. Eyes: No visual changes. ENT: No sore throat. No congestion or rhinorrhea. Cardiovascular: Positive chest pain. Denies palpitations. Respiratory: Positive shortness of breath.  No cough. Gastrointestinal: No abdominal pain.  No nausea, no vomiting.  No diarrhea.  No constipation. Genitourinary: Negative for dysuria. Musculoskeletal: Negative for back pain. Skin: Negative for rash. Neurological: Negative for headaches. No focal numbness, tingling or weakness.  Psychiatric:Active anxiety and increased stress at home.  ____________________________________________   PHYSICAL EXAM:  VITAL SIGNS: ED Triage Vitals  Enc Vitals Group     BP 05/24/18 1543 (!) 153/104     Pulse Rate 05/24/18 1543 (!) 102     Resp 05/24/18 1543 18     Temp 05/24/18 1543  98.1 F (36.7 C)     Temp Source 05/24/18 1543 Oral     SpO2 05/24/18 1543 100 %     Weight 05/24/18 1540 228 lb (103.4 kg)     Height 05/24/18 1540 5\' 4"  (1.626 m)     Head Circumference --      Peak Flow --      Pain Score 05/24/18 1540 9     Pain Loc --      Pain Edu? --      Excl. in GC? --     Constitutional: Alert and oriented. Answers questions appropriately.  She  is appearing. Eyes: Conjunctivae are normal.  EOMI. No scleral icterus. Head: Atraumatic. Nose: No congestion/rhinnorhea. Mouth/Throat: Mucous membranes are moist.  Neck: No stridor.  Supple.   Cardiovascular: Normal rate, regular rhythm. No murmurs, rubs or gallops.  Respiratory: Normal respiratory effort.  No accessory muscle use or retractions. Lungs CTAB.  No wheezes, rales or ronchi. Gastrointestinal: Soft, nontender and nondistended.  No guarding or rebound.  No peritoneal signs. Musculoskeletal: No LE edema. No ttp in the calves or palpable cords.  Negative Homan's sign. Neurologic:  A&Ox3.  Speech is clear.  Face and smile are symmetric.  EOMI.  Moves all extremities well. Skin:  Skin is warm, dry and intact. No rash noted. Psychiatric: Mood is anxious with an anxious affect.  ____________________________________________   LABS (all labs ordered are listed, but only abnormal results are displayed)  Labs Reviewed  CBC - Abnormal; Notable for the following components:      Result Value   RBC 3.81 (*)    Hemoglobin 10.5 (*)    HCT 32.2 (*)    All other components within normal limits  BASIC METABOLIC PANEL  TROPONIN I  POC URINE PREG, ED   ____________________________________________  EKG  ED ECG REPORT I, Anne-Caroline Sharma Covert, the attending physician, personally viewed and interpreted this ECG.   Date: 05/24/2018  EKG Time: 1540  Rate: 101  Rhythm: sinus tachycardia  Axis: normal  Intervals:none  ST&T Change: No STEMI  ____________________________________________  RADIOLOGY  Dg Chest 2 View  Result Date: 05/24/2018 CLINICAL DATA:  Chest tightness.  Shortness of breath. EXAM: CHEST - 2 VIEW COMPARISON:  September 14, 2014 FINDINGS: The heart size and mediastinal contours are within normal limits. Both lungs are clear. The visualized skeletal structures are unremarkable. IMPRESSION: No active cardiopulmonary disease. Electronically Signed   By: Gerome Sam III  M.D   On: 05/24/2018 16:24    ____________________________________________   PROCEDURES  Procedure(s) performed: None  Procedures  Critical Care performed: No ____________________________________________   INITIAL IMPRESSION / ASSESSMENT AND PLAN / ED COURSE  Pertinent labs & imaging results that were available during my care of the patient were reviewed by me and considered in my medical decision making (see chart for details).  33 y.o. female with a history of anxiety not currently under treatment, with increased stress in her life, presenting with chest pain since yesterday, associated shortness of breath and headache.  Overall, the patient is well-appearing.  Upon arrival she is tachycardic and significantly anxious and apparently she was hypertensive at work, but here her vital signs on my exam are completely normal.  She is symptom-free at this time.  Her work-up in the emergency department has been reassuring with an EKG did not does not show ischemic changes, arrhythmia, or any evidence of Brugada syndrome, prolonged QTC or hypertrophy.  In addition, her troponin is negative and her chest  x-ray does not show any acute cardiopulmonary pathology.  In this young patient with out core morbidities, ACS or MI is very unlikely.  PE, aortic pathology are considered, but also extremely unlikely.  The most likely etiology of the patient's symptoms is anxiety in the setting of new stress and I have encouraged her to follow-up with her PMD to see if she needs to be on a daily medication to prevent anxiety.  I have treated her with Ativan here, and she is feeling significantly better.  At this time, the patient is safe for discharge home.  Follow-up instructions as well as return precautions were discussed.  ____________________________________________  FINAL CLINICAL IMPRESSION(S) / ED DIAGNOSES  Final diagnoses:  Anxiety  Chest pain, unspecified type  Elevated blood pressure reading          NEW MEDICATIONS STARTED DURING THIS VISIT:  New Prescriptions   LORAZEPAM (ATIVAN) 1 MG TABLET    Take 1 tablet (1 mg total) by mouth every 6 (six) hours as needed for anxiety.      Rockne Menghini, MD 05/24/18 [phone]

## 2018-05-24 NOTE — ED Notes (Signed)
Pt is aware of collection of UA sample. Pt provided with a UA sample cup. Pt is unable to provide UA sample at this time.

## 2018-05-24 NOTE — ED Notes (Signed)
Pt states chest  discomfort at this time.

## 2018-05-24 NOTE — ED Notes (Signed)
Patient transported to X-ray 

## 2018-05-24 NOTE — Discharge Instructions (Signed)
Please make an appointment with your primary care physician to discuss your anxiety and whether you need to be restarted on a daily medication to prevent panic attacks.  Please let your primary care doctor know about the chest pain episodes you are having.  It may be helpful to keep a logbook which records your symptoms.  Return to the emergency department if you develop severe pain, lightheadedness or fainting, shortness of breath, palpitations, or any other symptoms concerning to you.

## 2018-05-24 NOTE — ED Notes (Signed)
Refer to triage note: pt c/o chest tightness / SHOB/headache. Pt denies N/V at this time. Pt is A/Ox4. NAD noted at this time

## 2018-06-04 ENCOUNTER — Telehealth: Payer: BC Managed Care – PPO | Admitting: Physician Assistant

## 2018-06-04 DIAGNOSIS — B9789 Other viral agents as the cause of diseases classified elsewhere: Principal | ICD-10-CM

## 2018-06-04 DIAGNOSIS — J069 Acute upper respiratory infection, unspecified: Secondary | ICD-10-CM

## 2018-06-04 MED ORDER — IPRATROPIUM BROMIDE 0.03 % NA SOLN: 2.0000 | Freq: Two times a day (BID) | NASAL | 0 refills | Status: DC

## 2018-06-04 NOTE — Progress Notes (Signed)
I have spent 5 minutes in review of e-visit questionnaire, review and updating patient chart, medical decision making and response to patient.   Wylder Macomber Cody Marlene Beidler, PA-C    

## 2018-06-04 NOTE — Progress Notes (Signed)
Message sent to patient for further clarification on info placed in her questionnaire. Awaiting patient response.

## 2018-06-04 NOTE — Progress Notes (Signed)
Thank you for the updated information. Thankfully this does not sound like COVID.   We are sorry you are not feeling well.  Here is how we plan to help!  Based on what you have shared with me, it looks like you may have a viral upper respiratory infection.  Upper respiratory infections are caused by a large number of viruses; however, rhinovirus is the most common cause.   Symptoms vary from person to person, with common symptoms including sore throat, cough, and fatigue or lack of energy.  A low-grade fever of up to 100.4 may present, but is often uncommon.  Symptoms vary however, and are closely related to a person's age or underlying illnesses.  The most common symptoms associated with an upper respiratory infection are nasal discharge or congestion, cough, sneezing, headache and pressure in the ears and face.  These symptoms usually persist for about 3 to 10 days, but can last up to 2 weeks.  It is important to know that upper respiratory infections do not cause serious illness or complications in most cases.    Upper respiratory infections can be transmitted from person to person, with the most common method of transmission being a person's hands.  The virus is able to live on the skin and can infect other persons for up to 2 hours after direct contact.  Also, these can be transmitted when someone coughs or sneezes; thus, it is important to cover the mouth to reduce this risk.  To keep the spread of the illness at bay, good hand hygiene is very important.  This is an infection that is most likely caused by a virus. There are no specific treatments other than to help you with the symptoms until the infection runs its course.  We are sorry you are not feeling well.  Here is how we plan to help!   For nasal congestion, you may use an oral decongestants such as Mucinex D or if you have glaucoma or high blood pressure use plain Mucinex.  Saline nasal spray or nasal drops can help and can safely be used  as often as needed for congestion.  For your congestion, I have prescribed Ipratropium Bromide nasal spray 0.03% two sprays in each nostril 2-3 times a day  If you do not have a history of heart disease, hypertension, diabetes or thyroid disease, prostate/bladder issues or glaucoma, you may also use Sudafed to treat nasal congestion.  It is highly recommended that you consult with a pharmacist or your primary care physician to ensure this medication is safe for you to take.     If you have a cough, you may use cough suppressants such as Delsym and Robitussin.  If you have glaucoma or high blood pressure, you can also use Coricidin HBP.     If you have a sore or scratchy throat, use a saltwater gargle-  to  teaspoon of salt dissolved in a 4-ounce to 8-ounce glass of warm water.  Gargle the solution for approximately 15-30 seconds and then spit.  It is important not to swallow the solution.  You can also use throat lozenges/cough drops and Chloraseptic spray to help with throat pain or discomfort.  Warm or cold liquids can also be helpful in relieving throat pain.  For headache, pain or general discomfort, you can use Ibuprofen or Tylenol as directed.   Some authorities believe that zinc sprays or the use of Echinacea may shorten the course of your symptoms.   HOME CARE .  Only take medications as instructed by your medical team. . Be sure to drink plenty of fluids. Water is fine as well as fruit juices, sodas and electrolyte beverages. You may want to stay away from caffeine or alcohol. If you are nauseated, try taking small sips of liquids. How do you know if you are getting enough fluid? Your urine should be a pale yellow or almost colorless. . Get rest. . Taking a steamy shower or using a humidifier may help nasal congestion and ease sore throat pain. You can place a towel over your head and breathe in the steam from hot water coming from a faucet. . Using a saline nasal spray works much the  same way. . Cough drops, hard candies and sore throat lozenges may ease your cough. . Avoid close contacts especially the very young and the elderly . Cover your mouth if you cough or sneeze . Always remember to wash your hands.   GET HELP RIGHT AWAY IF: . You develop worsening fever. . If your symptoms do not improve within 10 days . You develop yellow or green discharge from your nose over 3 days. . You have coughing fits . You develop a severe head ache or visual changes. . You develop shortness of breath, difficulty breathing or start having chest pain . Your symptoms persist after you have completed your treatment plan  MAKE SURE YOU   Understand these instructions.  Will watch your condition.  Will get help right away if you are not doing well or get worse.  Your e-visit answers were reviewed by a board certified advanced clinical practitioner to complete your personal care plan. Depending upon the condition, your plan could have included both over the counter or prescription medications. Please review your pharmacy choice. If there is a problem, you may call our nursing hot line at and have the prescription routed to another pharmacy. Your safety is important to [place]. If you have drug allergies check your prescription carefully.   You can use MyChart to ask questions about today's visit, request a non-urgent call back, or ask for a work or school excuse for 24 hours related to this e-Visit. If it has been greater than 24 hours you will need to follow up with your provider, or enter a new e-Visit to address those concerns. You will get an e-mail in the next two days asking about your experience.  I hope that your e-visit has been valuable and will speed your recovery. Thank you for using e-visits.

## 2018-09-29 DIAGNOSIS — F419 Anxiety disorder, unspecified: Secondary | ICD-10-CM | POA: Insufficient documentation

## 2018-09-29 DIAGNOSIS — F32A Depression, unspecified: Secondary | ICD-10-CM | POA: Insufficient documentation

## 2018-11-01 DIAGNOSIS — G4733 Obstructive sleep apnea (adult) (pediatric): Secondary | ICD-10-CM | POA: Insufficient documentation

## 2018-11-24 ENCOUNTER — Ambulatory Visit: Payer: BC Managed Care – PPO | Admitting: Cardiology

## 2019-02-13 ENCOUNTER — Other Ambulatory Visit: Payer: Self-pay | Admitting: Family Medicine

## 2019-02-13 ENCOUNTER — Telehealth: Payer: Self-pay | Admitting: *Deleted

## 2019-02-13 DIAGNOSIS — N939 Abnormal uterine and vaginal bleeding, unspecified: Secondary | ICD-10-CM

## 2019-03-30 ENCOUNTER — Other Ambulatory Visit: Payer: Self-pay

## 2019-03-30 ENCOUNTER — Encounter: Payer: Self-pay | Admitting: Cardiology

## 2019-03-30 ENCOUNTER — Ambulatory Visit: Payer: BC Managed Care – PPO | Admitting: Cardiology

## 2019-03-30 VITALS — BP 120/76 | HR 87 | Ht 64.0 in | Wt 251.0 lb

## 2019-03-30 DIAGNOSIS — Z01818 Encounter for other preprocedural examination: Secondary | ICD-10-CM | POA: Diagnosis not present

## 2019-03-30 NOTE — Patient Instructions (Signed)
Medication Instructions:  Your physician recommends that you continue on your current medications as directed. Please refer to the Current Medication list given to you today.  *If you need a refill on your cardiac medications before your next appointment, please call your pharmacy*  Lab Work: none If you have labs (blood work) drawn today and your tests are completely normal, you will receive your results only by: . MyChart Message (if you have MyChart) OR . A paper copy in the mail If you have any lab test that is abnormal or we need to change your treatment, we will call you to review the results.  Testing/Procedures: none  Follow-Up: At [facility], you and your health needs are our priority.  As part of our continuing mission to provide you with exceptional heart care, we have created designated Provider Care Teams.  These Care Teams include your primary Cardiologist (physician) and Advanced Practice Providers (APPs -  Physician Assistants and Nurse Practitioners) who all work together to provide you with the care you need, when you need it.  Your next appointment:   As needed.   The format for your next appointment:   In Person  Provider:    You may see Dr. Agbor-Etang or one of the following Advanced Practice Providers on your designated Care Team:    Christopher Berge, NP  Ryan Dunn, PA-C  Jacquelyn Visser, PA-C 

## 2019-03-30 NOTE — Progress Notes (Signed)
Cardiology Office Note:    Date:  03/30/2019   ID:  Michele [name], DOB 07/26/1985, MRN [id number]  PCP:  [facility]  Cardiologist:  No primary care provider on file.  Electrophysiologist:  None   Referring MD: [facility], [place] Complaint  Patient presents with  . New Patient (Initial Visit)    Ref by Dr. Loma Boston with [facility] weight loss. Pt. needs a cardiac clearance for Gastric Bypass Surgery. Meds reviewed by the pt. verbally.  Pt. c/o a lot of stress in her life and had chest pain several times prior to her visit to set up the gastric bypass surg.     History of Present Illness:    Michele [name] is a 34 y.o. female with a hx of anemia, obesity who presents for preop cardiac eval prior to bypass surgery.  She denies any history of heart disease.  Patient denies any cardiac symptoms such as chest pain, shortness of breath at rest or with exertion.  She denies palpitations, edema, orthopnea.  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  Denies heart disease history in first-degree relative.  Past Medical History:  Diagnosis Date  . Anemia during pregnancy in second trimester 06/14/2017  . Anxiety   . Depression     Past Surgical History:  Procedure Laterality Date  . CESAREAN SECTION     times 4  . CESAREAN SECTION WITH BILATERAL TUBAL LIGATION N/A 11/18/2017   Procedure: CESAREAN SECTION WITH BILATERAL TUBAL LIGATION;  Surgeon: Homero Fellers, MD;  Location: [facility] ORS;  Service: Obstetrics;  Laterality: N/A;  . KNEE SURGERY    . LAPAROSCOPIC ABDOMINAL EXPLORATION     34 years old  . TONSILLECTOMY      Current Medications: Current Meds  Medication Sig  . ferrous sulfate 325 (65 FE) MG tablet Take 1 tablet (325 mg total) by mouth 2 (two) times daily with a meal.     Allergies:   Penicillins and Sulfa antibiotics   Social History    Socioeconomic History  . Marital status: Legally Separated    Spouse name: Not on file  . Number of children: Not on file  . Years of education: Not on file  . Highest education level: Not on file  Occupational History  . Occupation: Network engineer: [employer]  Tobacco Use  . Smoking status: Never Smoker  . Smokeless tobacco: Never Used  Substance and Sexual Activity  . Alcohol use: No    Comment: rare  . Drug use: No  . Sexual activity: Not Currently    Birth control/protection: Surgical    Comment: Tubal ligation   Other Topics Concern  . Not on file  Social History Narrative  . Not on file   Social Determinants of Health   Financial Resource Strain:   . Difficulty of Paying Living Expenses: Not on file  Food Insecurity:   . Worried About Charity fundraiser in the Last Year: Not on file  . Ran Out of Food in the Last Year: Not on file  Transportation Needs:   . Lack of Transportation (Medical): Not on file  . Lack of Transportation (Non-Medical): Not on file  Physical Activity:   . Days of Exercise per Week: Not on file  . Minutes of Exercise per Session: Not on file  Stress:   .  Feeling of Stress : Not on file  Social Connections:   . Frequency of Communication with Friends and Family: Not on file  . Frequency of Social Gatherings with Friends and Family: Not on file  . Attends Religious Services: Not on file  . Active Member of Clubs or Organizations: Not on file  . Attends Banker Meetings: Not on file  . Marital Status: Not on file     Family History: The patient's family history includes Cancer - Cervical (age of onset: 59) in her paternal grandmother; Depression in her father and mother; Mental illness in her father and sister; Schizophrenia in her sister.  ROS:   Please see the history of present illness.     All other systems reviewed and are negative.  EKGs/Labs/Other Studies Reviewed:    The following studies were  reviewed today:   EKG:  EKG is  ordered today.  The ekg ordered today demonstrates normal sinus rhythm, normal ECG.  Recent Labs: 05/24/2018: BUN 9; Creatinine, Ser 0.69; Hemoglobin 10.5; Platelets 340; Potassium 3.8; Sodium 138  Recent Lipid Panel No results found for: CHOL, TRIG, HDL, CHOLHDL, VLDL, LDLCALC, LDLDIRECT  Physical Exam:    VS:  BP 120/76 (BP Location: Right Arm, Patient Position: Sitting, Cuff Size: Large)   Pulse 87   Ht 5\' 4"  (1.626 m)   Wt 251 lb (113.9 kg)   BMI 43.08 kg/m     Wt Readings from Last 3 Encounters:  03/30/19 251 lb (113.9 kg)  05/24/18 228 lb (103.4 kg)  11/25/17 222 lb (100.7 kg)     GEN:  Well nourished, well developed in no acute distress, obese HEENT: Normal NECK: No JVD; No carotid bruits LYMPHATICS: No lymphadenopathy CARDIAC: RRR, no murmurs, rubs, gallops RESPIRATORY:  Clear to auscultation without rales, wheezing or rhonchi  ABDOMEN: Soft, non-tender, distended MUSCULOSKELETAL:  No edema; No deformity  SKIN: Warm and dry NEUROLOGIC:  Alert and oriented x 3 PSYCHIATRIC:  Normal affect   ASSESSMENT:    1. Encounter for preoperative examination for general surgical procedure   2. Morbid obesity (HCC)    PLAN:    In order of problems listed above:  1. Patient with history of obesity and bypass surgery being planned.  She has no cardiac history or symptoms.  EKG is normal.  RCRI = 1 The Revised Cardiac Risk Index indicates that her Perioperative Risk of Major Cardiac Event is (%): 0.9.  Therefore, she is at low risk for perioperative complications.    According to ACC/AHA guidelines, no further cardiovascular testing needed.  The patient may proceed to surgery at acceptable risk.      Follow-up as needed  This note was generated in part or whole with voice recognition software. Voice recognition is usually quite accurate but there are transcription errors that can and very often do occur. I apologize for any typographical  errors that were not detected and corrected.  Medication Adjustments/Labs and Tests Ordered: Current medicines are reviewed at length with the patient today.  Concerns regarding medicines are outlined above.  Orders Placed This Encounter  Procedures  . EKG 12-Lead   No orders of the defined types were placed in this encounter.   Patient Instructions  Medication Instructions:  Your physician recommends that you continue on your current medications as directed. Please refer to the Current Medication list given to you today.  *If you need a refill on your cardiac medications before your next appointment, please call your pharmacy*  Lab  Work: none If you have labs (blood work) drawn today and your tests are completely normal, you will receive your results only by: Marland Kitchen MyChart Message (if you have MyChart) OR . A paper copy in the mail If you have any lab test that is abnormal or we need to change your treatment, we will call you to review the results.  Testing/Procedures: none  Follow-Up: At [facility], you and your health needs are our priority.  As part of our continuing mission to provide you with exceptional heart care, we have created designated Provider Care Teams.  These Care Teams include your primary Cardiologist (physician) and Advanced Practice Providers (APPs -  Physician Assistants and Nurse Practitioners) who all work together to provide you with the care you need, when you need it.  Your next appointment:   As needed.   The format for your next appointment:   In Person  Provider:    You may see Dr. Azucena Cecil or one of the following Advanced Practice Providers on your designated Care Team:    Nicolasa Ducking, NP  Eula Listen, PA-C  Marisue Ivan, PA-C    Signed, Debbe Odea, MD  03/30/2019 9:35 AM    [facility]

## 2019-07-10 ENCOUNTER — Other Ambulatory Visit: Payer: Self-pay | Admitting: Family Medicine

## 2019-07-10 DIAGNOSIS — R600 Localized edema: Secondary | ICD-10-CM

## 2019-08-01 ENCOUNTER — Telehealth: Payer: Self-pay | Admitting: Oncology

## 2019-08-01 NOTE — Telephone Encounter (Signed)
Called pt x 2. Left VM for patient to contact office to schedule a appt per referral from [facility]. Already est w\ Dr. Orlie Dakin,

## 2019-08-11 ENCOUNTER — Telehealth: Payer: Self-pay | Admitting: Oncology

## 2019-08-11 NOTE — Telephone Encounter (Signed)
Received communication from [place] that patient left VM to get appt scheduled. Call number listed in demographics with no answer. Message left for pt to contact office at [phone] to get appt scheduled.

## 2019-08-20 DIAGNOSIS — D509 Iron deficiency anemia, unspecified: Secondary | ICD-10-CM | POA: Insufficient documentation

## 2019-08-20 NOTE — Progress Notes (Signed)
[place]  Telephone:(336) [phone] Fax:(336) [phone]  ID: Cleatrice Burke OB: 12-29-1985  MR#: [id number]  GMW#:[id number]  Patient Care Team: Center, [facility] as PCP - General (General Practice)  CHIEF COMPLAINT: Iron deficiency anemia  INTERVAL HISTORY: Patient is a 34 year old female who was last evaluated in clinic several years ago for iron deficiency with pregnancy.  She is referred back for declining hemoglobin for further evaluation.  She currently feels well and is asymptomatic.  She does not complain of weakness or fatigue.  She has no neurologic complaints.  She denies any recent fevers or illnesses.  She has a good appetite and denies weight loss.  She has no chest pain, shortness of breath, cough, or hemoptysis.  She denies any nausea, vomiting, constipation, or diarrhea.  She has no melena or hematochezia.  She has no urinary complaints.  Patient offers no specific complaints today.  REVIEW OF SYSTEMS:   Review of Systems  Constitutional: Negative.  Negative for fever, malaise/fatigue and weight loss.  Respiratory: Negative.  Negative for cough, hemoptysis and shortness of breath.   Cardiovascular: Negative.  Negative for chest pain and leg swelling.  Gastrointestinal: Negative.  Negative for abdominal pain, blood in stool and melena.  Genitourinary: Negative.  Negative for hematuria.  Musculoskeletal: Negative.  Negative for back pain.  Skin: Negative.  Negative for rash.  Neurological: Negative.  Negative for dizziness, focal weakness, weakness and headaches.  Psychiatric/Behavioral: Negative.  The patient is not nervous/anxious.     As per HPI. Otherwise, a complete review of systems is negative.  PAST MEDICAL HISTORY: Past Medical History:  Diagnosis Date  . Anemia during pregnancy in second trimester 06/14/2017  . Anxiety   . Depression     PAST SURGICAL HISTORY: Past Surgical History:  Procedure Laterality Date  .  CESAREAN SECTION     times 4  . CESAREAN SECTION WITH BILATERAL TUBAL LIGATION N/A 11/18/2017   Procedure: CESAREAN SECTION WITH BILATERAL TUBAL LIGATION;  Surgeon: Homero Fellers, MD;  Location: [facility] ORS;  Service: Obstetrics;  Laterality: N/A;  . KNEE SURGERY    . LAPAROSCOPIC ABDOMINAL EXPLORATION     34 years old  . TONSILLECTOMY      FAMILY HISTORY: Family History  Problem Relation Age of Onset  . Mental illness Father   . Depression Father   . Mental illness Sister   . Schizophrenia Sister   . Depression Mother   . Cancer - Cervical Paternal Grandmother 2    ADVANCED DIRECTIVES (Y/N):  N  HEALTH MAINTENANCE: Social History   Tobacco Use  . Smoking status: Never Smoker  . Smokeless tobacco: Never Used  Vaping Use  . Vaping Use: Never used  Substance Use Topics  . Alcohol use: No    Comment: rare  . Drug use: No     Colonoscopy:  PAP:  Bone density:  Lipid panel:  Allergies  Allergen Reactions  . Penicillins Hives    Has patient had a PCN reaction causing immediate rash, facial/tongue/throat swelling, SOB or lightheadedness with hypotension:  no Has patient had a PCN reaction causing severe rash involving mucus membranes or skin necrosis: no Has patient had a PCN reaction that required hospitalization: no Has patient had a PCN reaction occurring within the last 10 years: no If all of the above answers are "NO", then may proceed with Cephalosporin use.   . Sulfa Antibiotics Hives    Current Outpatient Medications  Medication Sig Dispense Refill  .  ferrous sulfate 325 (65 FE) MG tablet Take 1 tablet (325 mg total) by mouth 2 (two) times daily with a meal. 60 tablet 0  . DULoxetine (CYMBALTA) 30 MG capsule Take 2 capsules by mouth daily. (Patient not taking: Reported on 08/25/2019)    . hydrochlorothiazide (HYDRODIURIL) 25 MG tablet Take 25 mg by mouth daily. (Patient not taking: Reported on 08/25/2019)    . oxyCODONE-acetaminophen (PERCOCET/ROXICET)  5-325 MG tablet Take 1 tablet by mouth every 4 (four) hours as needed (pain scale 4-7). (Patient not taking: Reported on 11/25/2017) 30 tablet 0  . traZODone (DESYREL) 50 MG tablet Take 1-2 tablets by mouth at bedtime. (Patient not taking: Reported on 08/25/2019)     No current facility-administered medications for this visit.    OBJECTIVE: Vitals:   08/25/19 0958  BP: 128/72  Pulse: 94  Temp: 98.6 F (37 C)  SpO2: 99%     Body mass index is 43.43 kg/m.    ECOG FS:0 - Asymptomatic  General: Well-developed, well-nourished, no acute distress. Eyes: Pink conjunctiva, anicteric sclera. HEENT: Normocephalic, moist mucous membranes. Lungs: No audible wheezing or coughing. Heart: Regular rate and rhythm. Abdomen: Soft, nontender, no obvious distention. Musculoskeletal: No edema, cyanosis, or clubbing. Neuro: Alert, answering all questions appropriately. Cranial nerves grossly intact. Skin: No rashes or petechiae noted. Psych: Normal affect. Lymphatics: No cervical, calvicular, axillary or inguinal LAD.   LAB RESULTS:  Lab Results  Component Value Date   NA 138 05/24/2018   K 3.8 05/24/2018   CL 101 05/24/2018   CO2 29 05/24/2018   GLUCOSE 76 05/24/2018   BUN 9 05/24/2018   CREATININE 0.69 05/24/2018   CALCIUM 9.3 05/24/2018   PROT 6.3 (L) 11/02/2017   ALBUMIN 3.2 (L) 11/02/2017   AST 22 11/02/2017   ALT 17 11/02/2017   ALKPHOS 146 (H) 11/02/2017   BILITOT 0.3 11/02/2017   GFRNONAA >60 05/24/2018   GFRAA >60 05/24/2018    Lab Results  Component Value Date   WBC 7.9 08/25/2019   NEUTROABS 9.0 (H) 10/28/2017   HGB 9.9 (L) 08/25/2019   HCT 29.6 (L) 08/25/2019   MCV 78.5 (L) 08/25/2019   PLT 330 08/25/2019   Lab Results  Component Value Date   IRON 47 08/25/2019   TIBC 371 08/25/2019   IRONPCTSAT 13 08/25/2019   Lab Results  Component Value Date   FERRITIN 10 (L) 08/25/2019     STUDIES: No results found.  ASSESSMENT: Iron deficiency anemia.  PLAN:     1. Iron deficiency anemia: Patient noted to have a decreased hemoglobin and iron stores.  The remainder of her laboratory work including B12, folate, and hemolysis labs are either negative or within normal limits.  Patient will have video assisted telemedicine visit in 1 week to discuss her laboratory results and treatment planning.  I spent a total of 45 minutes reviewing chart data, face-to-face evaluation with the patient, counseling and coordination of care as detailed above.   Patient expressed understanding and was in agreement with this plan. She also understands that She can call clinic at any time with any questions, concerns, or complaints.    Jeralyn Ruths, MD   08/26/2019 8:38 AM

## 2019-08-25 ENCOUNTER — Other Ambulatory Visit: Payer: Self-pay

## 2019-08-25 ENCOUNTER — Inpatient Hospital Stay: Payer: BC Managed Care – PPO

## 2019-08-25 ENCOUNTER — Inpatient Hospital Stay: Payer: BC Managed Care – PPO | Attending: Oncology | Admitting: Oncology

## 2019-08-25 ENCOUNTER — Encounter: Payer: Self-pay | Admitting: Oncology

## 2019-08-25 DIAGNOSIS — Z8049 Family history of malignant neoplasm of other genital organs: Secondary | ICD-10-CM | POA: Diagnosis not present

## 2019-08-25 DIAGNOSIS — D509 Iron deficiency anemia, unspecified: Secondary | ICD-10-CM | POA: Insufficient documentation

## 2019-08-25 DIAGNOSIS — O99013 Anemia complicating pregnancy, third trimester: Secondary | ICD-10-CM | POA: Diagnosis not present

## 2019-08-25 LAB — CBC
HCT: 29.6 % — ABNORMAL LOW (ref 36.0–46.0)
Hemoglobin: 9.9 g/dL — ABNORMAL LOW (ref 12.0–15.0)
MCH: 26.3 pg (ref 26.0–34.0)
MCHC: 33.4 g/dL (ref 30.0–36.0)
MCV: 78.5 fL — ABNORMAL LOW (ref 80.0–100.0)
Platelets: 330 10*3/uL (ref 150–400)
RBC: 3.77 MIL/uL — ABNORMAL LOW (ref 3.87–5.11)
RDW: 13.2 % (ref 11.5–15.5)
WBC: 7.9 10*3/uL (ref 4.0–10.5)
nRBC: 0 % (ref 0.0–0.2)

## 2019-08-25 LAB — IRON AND TIBC
Iron: 47 ug/dL (ref 28–170)
Saturation Ratios: 13 % (ref 10.4–31.8)
TIBC: 371 ug/dL (ref 250–450)
UIBC: 324 ug/dL

## 2019-08-25 LAB — RETICULOCYTES
Immature Retic Fract: 14.9 % (ref 2.3–15.9)
RBC.: 3.74 MIL/uL — ABNORMAL LOW (ref 3.87–5.11)
Retic Count, Absolute: 58.7 10*3/uL (ref 19.0–186.0)
Retic Ct Pct: 1.6 % (ref 0.4–3.1)

## 2019-08-25 LAB — FOLATE: Folate: 11.8 ng/mL (ref >5.9)

## 2019-08-25 LAB — VITAMIN B12: Vitamin B-12: 386 pg/mL (ref 180–914)

## 2019-08-25 LAB — LACTATE DEHYDROGENASE: LDH: 115 U/L (ref 98–192)

## 2019-08-25 LAB — FERRITIN: Ferritin: 10 ng/mL — ABNORMAL LOW (ref 11–307)

## 2019-08-25 LAB — DAT, POLYSPECIFIC AHG (ARMC ONLY): Polyspecific AHG test: NEGATIVE

## 2019-08-26 LAB — HAPTOGLOBIN: Haptoglobin: 191 mg/dL (ref 33–278)

## 2019-08-26 LAB — ERYTHROPOIETIN: Erythropoietin: 18.9 m[IU]/mL — ABNORMAL HIGH (ref 2.6–18.5)

## 2019-08-31 ENCOUNTER — Inpatient Hospital Stay: Payer: BC Managed Care – PPO | Attending: Oncology | Admitting: Oncology

## 2019-08-31 ENCOUNTER — Encounter: Payer: Self-pay | Admitting: Oncology

## 2019-08-31 DIAGNOSIS — D509 Iron deficiency anemia, unspecified: Secondary | ICD-10-CM | POA: Insufficient documentation

## 2019-08-31 NOTE — Progress Notes (Signed)
Patient request provider please make notes about recommendation about low iron and notes about surgery clearance for bariatric surgery.

## 2019-09-01 NOTE — Progress Notes (Signed)
[facility]  Telephone:(336) [phone] Fax:(336) [phone]  ID: Michele [name] OB: 08/21/1985  MR#: [id number]  QIH#:[id number]  Patient Care Team: Center, [facility] as PCP - General (General Practice)  I connected with Michele [name] on 09/01/19 at  3:00 PM EDT by video enabled telemedicine visit and verified that I am speaking with the correct person using two identifiers.   I discussed the limitations, risks, security and privacy concerns of performing an evaluation and management service by telemedicine and the availability of in-person appointments. I also discussed with the patient that there may be a patient responsible charge related to this service. The patient expressed understanding and agreed to proceed.   Other persons participating in the visit and their role in the encounter: Patient, MD.  Patient's location: [place]. Provider's location: Clinic.  CHIEF COMPLAINT: Iron deficiency anemia  INTERVAL HISTORY: Patient agreed to video assisted telemedicine visit for further evaluation and discussion of her laboratory results.  She currently feels well and is asymptomatic. She does not complain of weakness or fatigue.  She has no neurologic complaints.  She denies any recent fevers or illnesses.  She has a good appetite and denies weight loss.  She has no chest pain, shortness of breath, cough, or hemoptysis.  She denies any nausea, vomiting, constipation, or diarrhea.  She has no melena or hematochezia.  She has no urinary complaints.  Patient offers no specific complaints today.  REVIEW OF SYSTEMS:   Review of Systems  Constitutional: Negative.  Negative for fever, malaise/fatigue and weight loss.  Respiratory: Negative.  Negative for cough, hemoptysis and shortness of breath.   Cardiovascular: Negative.  Negative for chest pain and leg swelling.  Gastrointestinal: Negative.  Negative for abdominal pain, blood in stool and melena.    Genitourinary: Negative.  Negative for hematuria.  Musculoskeletal: Negative.  Negative for back pain.  Skin: Negative.  Negative for rash.  Neurological: Negative.  Negative for dizziness, focal weakness, weakness and headaches.  Psychiatric/Behavioral: Negative.  The patient is not nervous/anxious.     As per HPI. Otherwise, a complete review of systems is negative.  PAST MEDICAL HISTORY: Past Medical History:  Diagnosis Date  . Anemia during pregnancy in second trimester 06/14/2017  . Anxiety   . Depression     PAST SURGICAL HISTORY: Past Surgical History:  Procedure Laterality Date  . CESAREAN SECTION     times 4  . CESAREAN SECTION WITH BILATERAL TUBAL LIGATION N/A 11/18/2017   Procedure: CESAREAN SECTION WITH BILATERAL TUBAL LIGATION;  Surgeon: Natale Milch, MD;  Location: [facility] ORS;  Service: Obstetrics;  Laterality: N/A;  . KNEE SURGERY    . LAPAROSCOPIC ABDOMINAL EXPLORATION     34 years old  . TONSILLECTOMY      FAMILY HISTORY: Family History  Problem Relation Age of Onset  . Mental illness Father   . Depression Father   . Mental illness Sister   . Schizophrenia Sister   . Depression Mother   . Cancer - Cervical Paternal Grandmother 31    ADVANCED DIRECTIVES (Y/N):  N  HEALTH MAINTENANCE: Social History   Tobacco Use  . Smoking status: Never Smoker  . Smokeless tobacco: Never Used  Vaping Use  . Vaping Use: Never used  Substance Use Topics  . Alcohol use: No    Comment: rare  . Drug use: No     Colonoscopy:  PAP:  Bone density:  Lipid panel:  Allergies  Allergen Reactions  . Penicillins  Hives    Has patient had a PCN reaction causing immediate rash, facial/tongue/throat swelling, SOB or lightheadedness with hypotension:  no Has patient had a PCN reaction causing severe rash involving mucus membranes or skin necrosis: no Has patient had a PCN reaction that required hospitalization: no Has patient had a PCN reaction occurring within  the last 10 years: no If all of the above answers are "NO", then may proceed with Cephalosporin use.   . Sulfa Antibiotics Hives    Current Outpatient Medications  Medication Sig Dispense Refill  . ferrous sulfate 325 (65 FE) MG tablet Take 1 tablet (325 mg total) by mouth 2 (two) times daily with a meal. 60 tablet 0  . hydrochlorothiazide (HYDRODIURIL) 25 MG tablet Take 25 mg by mouth daily. (Patient not taking: Reported on 08/25/2019)     No current facility-administered medications for this visit.    OBJECTIVE: There were no vitals filed for this visit.   There is no height or weight on file to calculate BMI.    ECOG FS:0 - Asymptomatic  General: Well-developed, well-nourished, no acute distress. HEENT: Normocephalic. Neuro: Alert, answering all questions appropriately. Cranial nerves grossly intact. Psych: Normal affect.   LAB RESULTS:  Lab Results  Component Value Date   NA 138 05/24/2018   K 3.8 05/24/2018   CL 101 05/24/2018   CO2 29 05/24/2018   GLUCOSE 76 05/24/2018   BUN 9 05/24/2018   CREATININE 0.69 05/24/2018   CALCIUM 9.3 05/24/2018   PROT 6.3 (L) 11/02/2017   ALBUMIN 3.2 (L) 11/02/2017   AST 22 11/02/2017   ALT 17 11/02/2017   ALKPHOS 146 (H) 11/02/2017   BILITOT 0.3 11/02/2017   GFRNONAA >60 05/24/2018   GFRAA >60 05/24/2018    Lab Results  Component Value Date   WBC 7.9 08/25/2019   NEUTROABS 9.0 (H) 10/28/2017   HGB 9.9 (L) 08/25/2019   HCT 29.6 (L) 08/25/2019   MCV 78.5 (L) 08/25/2019   PLT 330 08/25/2019   Lab Results  Component Value Date   IRON 47 08/25/2019   TIBC 371 08/25/2019   IRONPCTSAT 13 08/25/2019   Lab Results  Component Value Date   FERRITIN 10 (L) 08/25/2019     STUDIES: No results found.  ASSESSMENT: Iron deficiency anemia.  PLAN:    1. Iron deficiency anemia: Patient noted to have a decreased hemoglobin and iron stores.  The remainder of her laboratory work including B12, folate, and hemolysis labs are either  negative or within normal limits.  Patient will benefit from 510 mg IV Feraheme and will return to clinic in 2 and 3 weeks to receive treatment.  She would then return to clinic in 3 months with repeat laboratory work and video assisted telemedicine visit. 2.  Bariatric surgery: Okay to proceed from a hematology standpoint.  I provided 30 minutes of face-to-face video visit time during this encounter which included chart review, counseling, and coordination of care as documented above.   Patient expressed understanding and was in agreement with this plan. She also understands that She can call clinic at any time with any questions, concerns, or complaints.    Jeralyn Ruths, MD   09/01/2019 5:12 PM

## 2019-09-13 ENCOUNTER — Inpatient Hospital Stay: Payer: BC Managed Care – PPO

## 2019-09-13 ENCOUNTER — Other Ambulatory Visit: Payer: Self-pay

## 2019-09-13 VITALS — BP 112/64 | HR 77 | Temp 97.8°F | Resp 18

## 2019-09-13 DIAGNOSIS — D509 Iron deficiency anemia, unspecified: Secondary | ICD-10-CM

## 2019-09-13 DIAGNOSIS — O99013 Anemia complicating pregnancy, third trimester: Secondary | ICD-10-CM

## 2019-09-13 MED ORDER — SODIUM CHLORIDE 0.9 % IV SOLN
510.0000 mg | Freq: Once | INTRAVENOUS | Status: AC
  Administered 2019-09-13: 510 mg via INTRAVENOUS
  Filled 2019-09-13: qty 510

## 2019-09-13 MED ORDER — SODIUM CHLORIDE 0.9 % IV SOLN
Freq: Once | INTRAVENOUS | Status: AC
  Filled 2019-09-13: qty 250

## 2019-09-19 ENCOUNTER — Inpatient Hospital Stay: Payer: BC Managed Care – PPO

## 2019-10-13 IMAGING — US US EXTREM LOW VENOUS BILAT
1 series · 13 of 24 positions shown · non-contrast
Comparison: None.

CLINICAL DATA: 32-year-old gravid female currently 32 weeks
pregnant.



[Series 1: us extrem low venous bilat · 0.09mm/px · 13 of 59 slices shown]
[im 1/59]
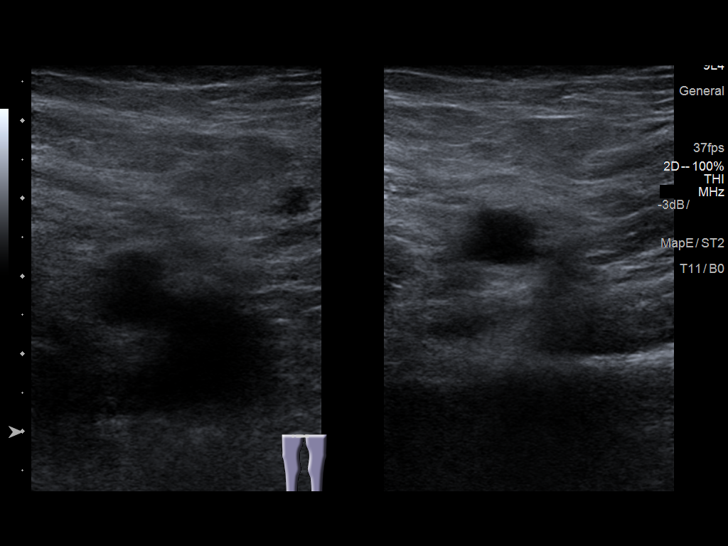
[im 6/59]
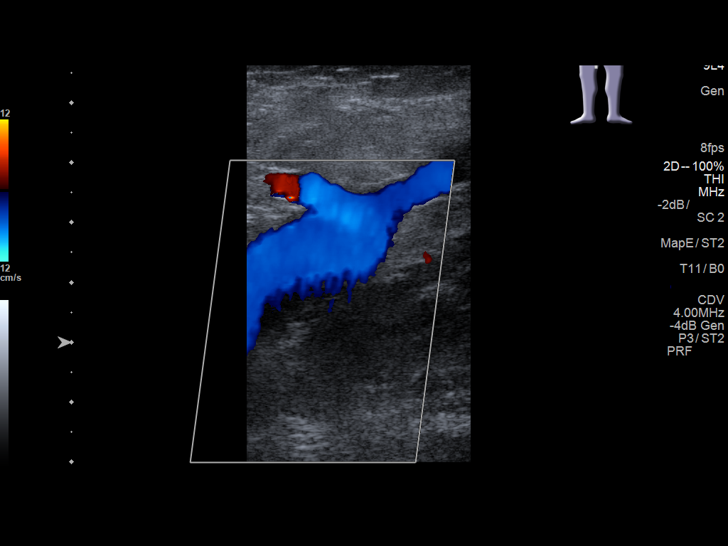
[im 11/59]
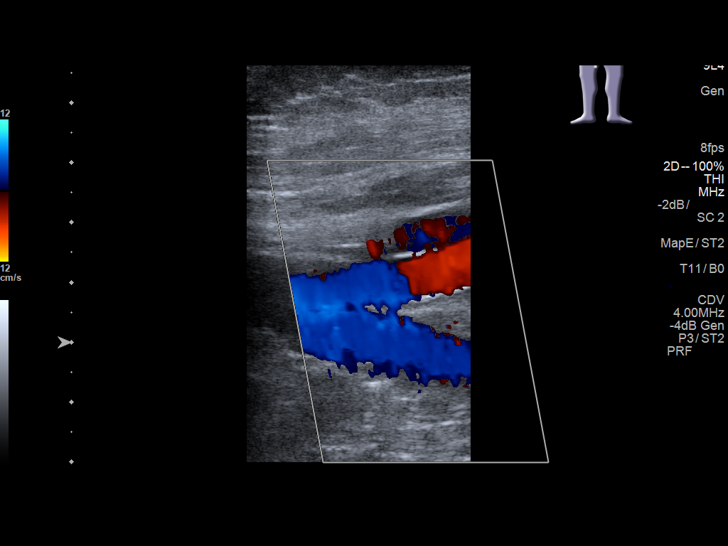
[im 16/59]
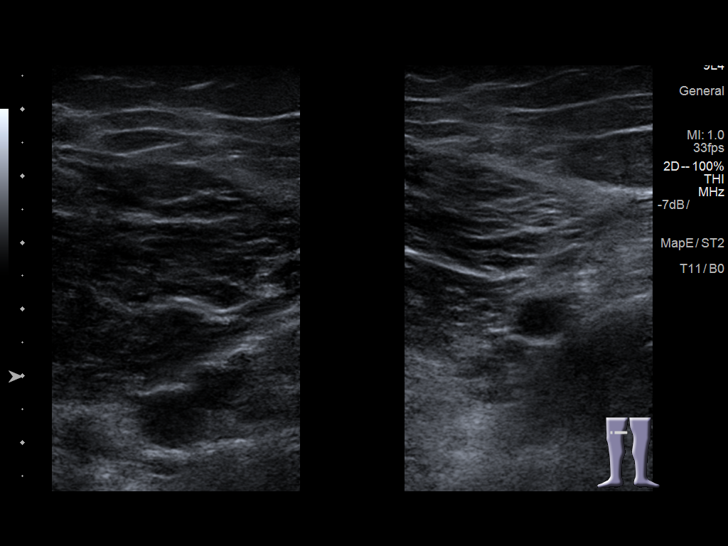
[im 21/59]
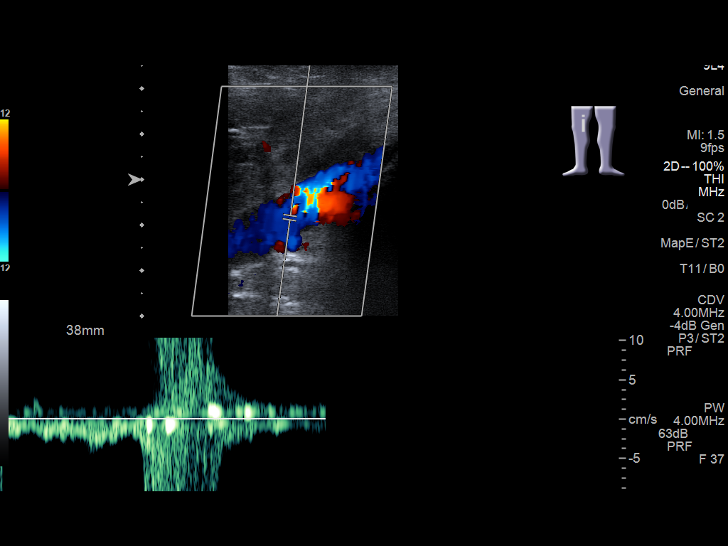
[im 26/59]
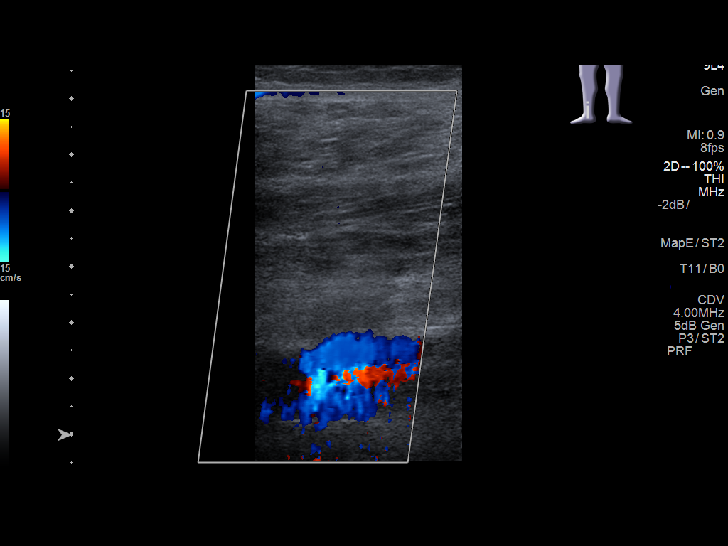
[im 31/59]
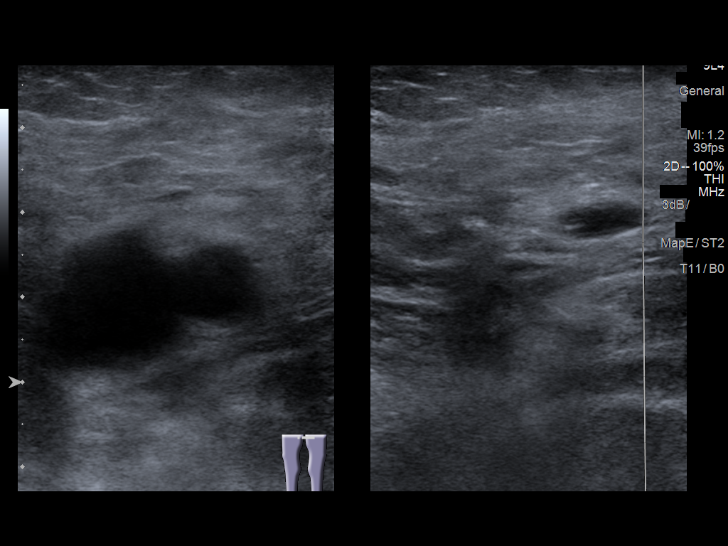
[im 33/59]
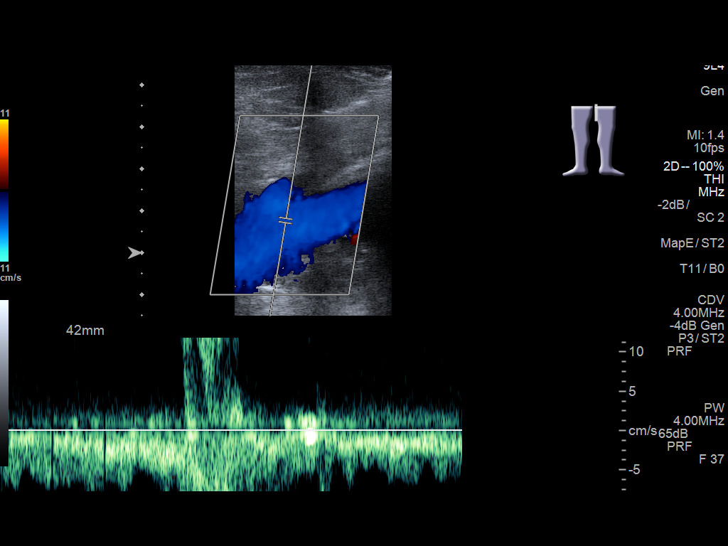
[im 38/59]
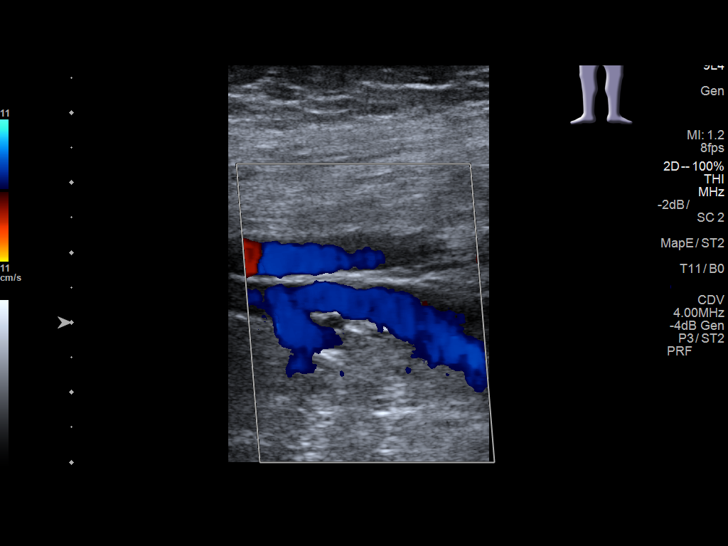
[im 43/59]
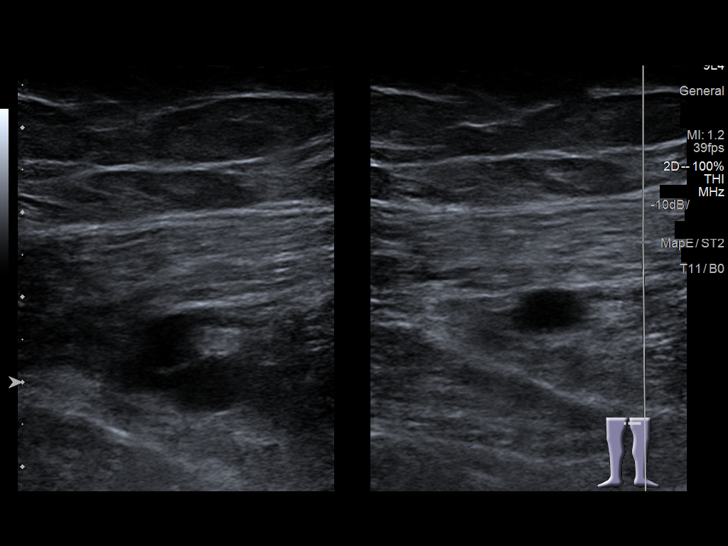
[im 48/59]
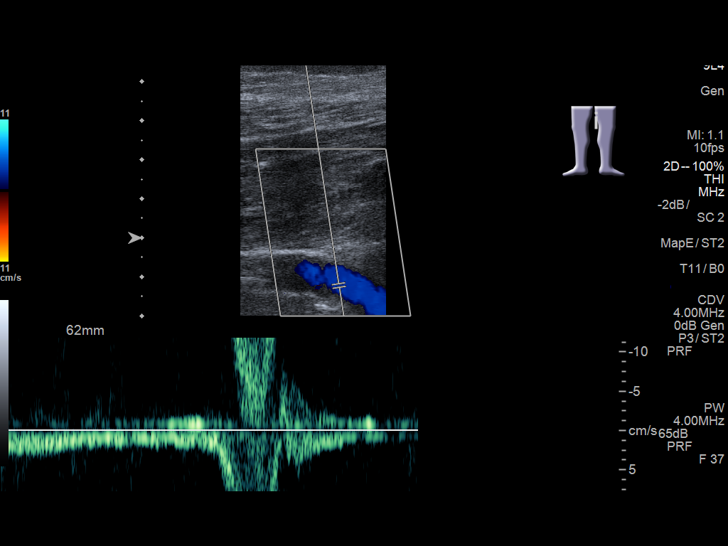
[im 53/59]
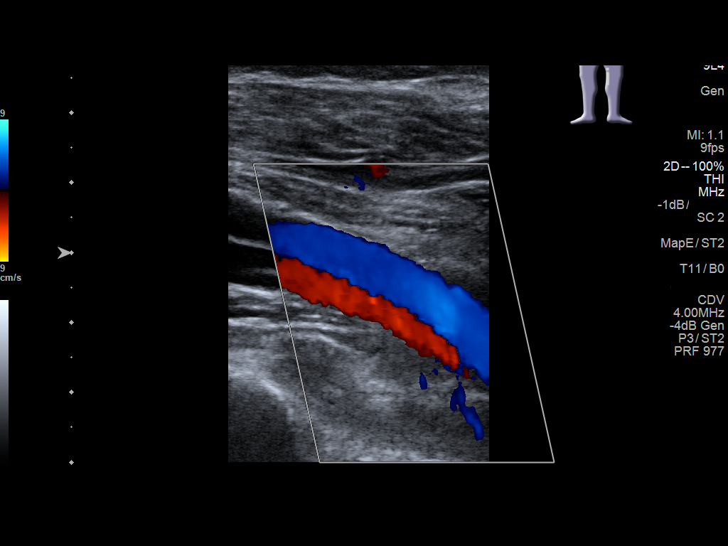
[im 59/59]
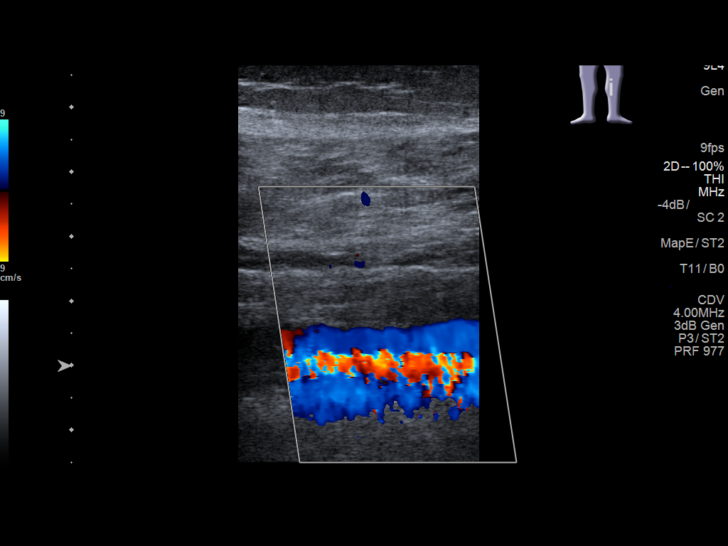

[13 of 24 positions shown; findings below may reference images not displayed]

FINDINGS: RIGHT LOWER EXTREMITY

Common Femoral Vein: No evidence of thrombus. Normal
compressibility, respiratory phasicity and response to augmentation.

Saphenofemoral Junction: No evidence of thrombus. Normal
compressibility and flow on color Doppler imaging.

Profunda Femoral Vein: No evidence of thrombus. Normal
compressibility and flow on color Doppler imaging.

Femoral Vein: No evidence of thrombus. Normal compressibility,
respiratory phasicity and response to augmentation.

Popliteal Vein: No evidence of thrombus. Normal compressibility,
respiratory phasicity and response to augmentation.

Calf Veins: No evidence of thrombus. Normal compressibility and flow
on color Doppler imaging.

Superficial Great Saphenous Vein: No evidence of thrombus. Normal
compressibility.

Venous Reflux:  None.

Other Findings:  None.

LEFT LOWER EXTREMITY

Common Femoral Vein: No evidence of thrombus. Normal
compressibility, respiratory phasicity and response to augmentation.

Saphenofemoral Junction: No evidence of thrombus. Normal
compressibility and flow on color Doppler imaging.

Profunda Femoral Vein: No evidence of thrombus. Normal
compressibility and flow on color Doppler imaging.

Femoral Vein: No evidence of thrombus. Normal compressibility,
respiratory phasicity and response to augmentation.

Popliteal Vein: No evidence of thrombus. Normal compressibility,
respiratory phasicity and response to augmentation.

Calf Veins: No evidence of thrombus. Normal compressibility and flow
on color Doppler imaging.

Superficial Great Saphenous Vein: No evidence of thrombus. Normal
compressibility.

Venous Reflux:  None.

Other Findings:  None.
IMPRESSION: No evidence of deep venous thrombosis in either lower extremity.

## 2019-12-06 ENCOUNTER — Other Ambulatory Visit: Payer: Self-pay

## 2019-12-06 DIAGNOSIS — O99013 Anemia complicating pregnancy, third trimester: Secondary | ICD-10-CM

## 2019-12-07 ENCOUNTER — Inpatient Hospital Stay: Payer: BC Managed Care – PPO | Attending: Oncology

## 2019-12-07 NOTE — Progress Notes (Deleted)
[facility]  Telephone:(336) [phone] Fax:(336) [phone]  ID: Michele [name] OB: [birth year]  MR#: [id number]  QIO#:[id number]  Patient Care Team: Center, [facility] as PCP - General (General Practice)  I connected with Samule Dry on 12/07/19 at  3:00 PM EDT by {Blank single:19197::"video enabled telemedicine visit","telephone visit"} and verified that I am speaking with the correct person using two identifiers.   I discussed the limitations, risks, security and privacy concerns of performing an evaluation and management service by telemedicine and the availability of in-person appointments. I also discussed with the patient that there may be a patient responsible charge related to this service. The patient expressed understanding and agreed to proceed.   Other persons participating in the visit and their role in the encounter: Patient, MD.  Patient's location: Home. Provider's location: Clinic.  CHIEF COMPLAINT: Iron deficiency anemia  INTERVAL HISTORY: Patient agreed to video assisted telemedicine visit for further evaluation and discussion of her laboratory results.  She currently feels well and is asymptomatic. She does not complain of weakness or fatigue.  She has no neurologic complaints.  She denies any recent fevers or illnesses.  She has a good appetite and denies weight loss.  She has no chest pain, shortness of breath, cough, or hemoptysis.  She denies any nausea, vomiting, constipation, or diarrhea.  She has no melena or hematochezia.  She has no urinary complaints.  Patient offers no specific complaints today.  REVIEW OF SYSTEMS:   Review of Systems  Constitutional: Negative.  Negative for fever, malaise/fatigue and weight loss.  Respiratory: Negative.  Negative for cough, hemoptysis and shortness of breath.   Cardiovascular: Negative.  Negative for chest pain and leg swelling.  Gastrointestinal: Negative.  Negative for abdominal  pain, blood in stool and melena.  Genitourinary: Negative.  Negative for hematuria.  Musculoskeletal: Negative.  Negative for back pain.  Skin: Negative.  Negative for rash.  Neurological: Negative.  Negative for dizziness, focal weakness, weakness and headaches.  Psychiatric/Behavioral: Negative.  The patient is not nervous/anxious.     As per HPI. Otherwise, a complete review of systems is negative.  PAST MEDICAL HISTORY: Past Medical History:  Diagnosis Date  . Anemia during pregnancy in second trimester 06/14/2017  . Anxiety   . Depression     PAST SURGICAL HISTORY: Past Surgical History:  Procedure Laterality Date  . CESAREAN SECTION     times 4  . CESAREAN SECTION WITH BILATERAL TUBAL LIGATION N/A 11/18/2017   Procedure: CESAREAN SECTION WITH BILATERAL TUBAL LIGATION;  Surgeon: Natale Milch, MD;  Location: [facility] ORS;  Service: Obstetrics;  Laterality: N/A;  . KNEE SURGERY    . LAPAROSCOPIC ABDOMINAL EXPLORATION     34 years old  . TONSILLECTOMY      FAMILY HISTORY: Family History  Problem Relation Age of Onset  . Mental illness Father   . Depression Father   . Mental illness Sister   . Schizophrenia Sister   . Depression Mother   . Cancer - Cervical Paternal Grandmother 30    ADVANCED DIRECTIVES (Y/N):  N  HEALTH MAINTENANCE: Social History   Tobacco Use  . Smoking status: Never Smoker  . Smokeless tobacco: Never Used  Vaping Use  . Vaping Use: Never used  Substance Use Topics  . Alcohol use: No    Comment: rare  . Drug use: No     Colonoscopy:  PAP:  Bone density:  Lipid panel:  Allergies  Allergen Reactions  . Penicillins  Hives    Has patient had a PCN reaction causing immediate rash, facial/tongue/throat swelling, SOB or lightheadedness with hypotension:  no Has patient had a PCN reaction causing severe rash involving mucus membranes or skin necrosis: no Has patient had a PCN reaction that required hospitalization: no Has patient had  a PCN reaction occurring within the last 10 years: no If all of the above answers are "NO", then may proceed with Cephalosporin use.   . Sulfa Antibiotics Hives    Current Outpatient Medications  Medication Sig Dispense Refill  . ferrous sulfate 325 (65 FE) MG tablet Take 1 tablet (325 mg total) by mouth 2 (two) times daily with a meal. 60 tablet 0  . hydrochlorothiazide (HYDRODIURIL) 25 MG tablet Take 25 mg by mouth daily. (Patient not taking: Reported on 08/25/2019)     No current facility-administered medications for this visit.    OBJECTIVE: There were no vitals filed for this visit.   There is no height or weight on file to calculate BMI.    ECOG FS:0 - Asymptomatic  General: Well-developed, well-nourished, no acute distress. HEENT: Normocephalic. Neuro: Alert, answering all questions appropriately. Cranial nerves grossly intact. Psych: Normal affect.   LAB RESULTS:  Lab Results  Component Value Date   NA 138 05/24/2018   K 3.8 05/24/2018   CL 101 05/24/2018   CO2 29 05/24/2018   GLUCOSE 76 05/24/2018   BUN 9 05/24/2018   CREATININE 0.69 05/24/2018   CALCIUM 9.3 05/24/2018   PROT 6.3 (L) 11/02/2017   ALBUMIN 3.2 (L) 11/02/2017   AST 22 11/02/2017   ALT 17 11/02/2017   ALKPHOS 146 (H) 11/02/2017   BILITOT 0.3 11/02/2017   GFRNONAA >60 05/24/2018   GFRAA >60 05/24/2018    Lab Results  Component Value Date   WBC 7.9 08/25/2019   NEUTROABS 9.0 (H) 10/28/2017   HGB 9.9 (L) 08/25/2019   HCT 29.6 (L) 08/25/2019   MCV 78.5 (L) 08/25/2019   PLT 330 08/25/2019   Lab Results  Component Value Date   IRON 47 08/25/2019   TIBC 371 08/25/2019   IRONPCTSAT 13 08/25/2019   Lab Results  Component Value Date   FERRITIN 10 (L) 08/25/2019     STUDIES: No results found.  ASSESSMENT: Iron deficiency anemia.  PLAN:    1. Iron deficiency anemia: Patient noted to have a decreased hemoglobin and iron stores.  The remainder of her laboratory work including B12,  folate, and hemolysis labs are either negative or within normal limits.  Patient will benefit from 510 mg IV Feraheme and will return to clinic in 2 and 3 weeks to receive treatment.  She would then return to clinic in 3 months with repeat laboratory work and video assisted telemedicine visit. 2.  Bariatric surgery: Okay to proceed from a hematology standpoint.  I provided *** minutes of {Blank single:19197::"face-to-face video visit time","non face-to-face telephone visit time"} during this encounter which included chart review, counseling, and coordination of care as documented above.   Patient expressed understanding and was in agreement with this plan. She also understands that She can call clinic at any time with any questions, concerns, or complaints.    Jeralyn Ruths, MD   12/07/2019 2:09 PM

## 2019-12-12 ENCOUNTER — Inpatient Hospital Stay: Payer: BC Managed Care – PPO | Admitting: Oncology

## 2020-08-05 ENCOUNTER — Encounter: Payer: Self-pay | Admitting: Oncology

## 2020-08-13 ENCOUNTER — Encounter: Payer: Self-pay | Admitting: Oncology

## 2020-08-13 ENCOUNTER — Emergency Department: Payer: BC Managed Care – PPO

## 2020-08-13 ENCOUNTER — Other Ambulatory Visit: Payer: Self-pay

## 2020-08-13 DIAGNOSIS — K8020 Calculus of gallbladder without cholecystitis without obstruction: Secondary | ICD-10-CM | POA: Diagnosis not present

## 2020-08-13 DIAGNOSIS — R079 Chest pain, unspecified: Secondary | ICD-10-CM | POA: Diagnosis not present

## 2020-08-13 DIAGNOSIS — R0602 Shortness of breath: Secondary | ICD-10-CM | POA: Diagnosis not present

## 2020-08-13 DIAGNOSIS — R42 Dizziness and giddiness: Secondary | ICD-10-CM | POA: Insufficient documentation

## 2020-08-13 DIAGNOSIS — R519 Headache, unspecified: Secondary | ICD-10-CM | POA: Diagnosis not present

## 2020-08-13 DIAGNOSIS — R1013 Epigastric pain: Secondary | ICD-10-CM | POA: Diagnosis present

## 2020-08-13 LAB — CBC
HCT: 29.8 % — ABNORMAL LOW (ref 36.0–46.0)
Hemoglobin: 10.1 g/dL — ABNORMAL LOW (ref 12.0–15.0)
MCH: 28.9 pg (ref 26.0–34.0)
MCHC: 33.9 g/dL (ref 30.0–36.0)
MCV: 85.4 fL (ref 80.0–100.0)
Platelets: 307 10*3/uL (ref 150–400)
RBC: 3.49 MIL/uL — ABNORMAL LOW (ref 3.87–5.11)
RDW: 12.3 % (ref 11.5–15.5)
WBC: 7.3 10*3/uL (ref 4.0–10.5)
nRBC: 0 % (ref 0.0–0.2)

## 2020-08-13 LAB — BASIC METABOLIC PANEL
Anion gap: 8 (ref 5–15)
BUN: 8 mg/dL (ref 6–20)
CO2: 29 mmol/L (ref 22–32)
Calcium: 9 mg/dL (ref 8.9–10.3)
Chloride: 102 mmol/L (ref 98–111)
Creatinine, Ser: 0.81 mg/dL (ref 0.44–1.00)
GFR, Estimated: >60 mL/min (ref >60)
Glucose, Bld: 80 mg/dL (ref 70–99)
Potassium: 3.7 mmol/L (ref 3.5–5.1)
Sodium: 139 mmol/L (ref 135–145)

## 2020-08-13 LAB — TROPONIN I (HIGH SENSITIVITY): Troponin I (High Sensitivity): <2 ng/L (ref <18)

## 2020-08-13 NOTE — ED Triage Notes (Signed)
Pt presents via POV c/o chest pain/epigastric pain radiating into back. Reports  sharp pain associated with SOB. Reports called EMS and no MI on EKG so drove self to ED. Reports headaches, dizziness, weakness, and overall illness per pt report.

## 2020-08-14 ENCOUNTER — Emergency Department: Payer: BC Managed Care – PPO

## 2020-08-14 ENCOUNTER — Emergency Department
Admission: EM | Admit: 2020-08-14 | Discharge: 2020-08-14 | Disposition: A | Discharge status: Home / Self Care | Payer: BC Managed Care – PPO | Attending: Emergency Medicine | Admitting: Emergency Medicine

## 2020-08-14 ENCOUNTER — Ambulatory Visit: Payer: Self-pay | Admitting: Obstetrics & Gynecology

## 2020-08-14 DIAGNOSIS — K8050 Calculus of bile duct without cholangitis or cholecystitis without obstruction: Secondary | ICD-10-CM

## 2020-08-14 DIAGNOSIS — R1010 Upper abdominal pain, unspecified: Secondary | ICD-10-CM

## 2020-08-14 DIAGNOSIS — K8020 Calculus of gallbladder without cholecystitis without obstruction: Secondary | ICD-10-CM

## 2020-08-14 LAB — HEPATIC FUNCTION PANEL
ALT: 27 U/L (ref 0–44)
AST: 49 U/L — ABNORMAL HIGH (ref 15–41)
Albumin: 4.3 g/dL (ref 3.5–5.0)
Alkaline Phosphatase: 108 U/L (ref 38–126)
Bilirubin, Direct: <0.1 mg/dL (ref 0.0–0.2)
Total Bilirubin: 0.6 mg/dL (ref 0.3–1.2)
Total Protein: 7 g/dL (ref 6.5–8.1)

## 2020-08-14 LAB — TROPONIN I (HIGH SENSITIVITY): Troponin I (High Sensitivity): <2 ng/L (ref <18)

## 2020-08-14 LAB — LIPASE, BLOOD: Lipase: 37 U/L (ref 11–51)

## 2020-08-14 LAB — HCG, QUANTITATIVE, PREGNANCY: hCG, Beta Chain, Quant, S: <1 m[IU]/mL (ref <5)

## 2020-08-14 MED ORDER — ONDANSETRON HCL 4 MG/2ML IJ SOLN
4.0000 mg | Freq: Once | INTRAMUSCULAR | Status: AC
  Administered 2020-08-14: 4 mg via INTRAVENOUS
  Filled 2020-08-14: qty 2

## 2020-08-14 MED ORDER — FAMOTIDINE IN NACL 20-0.9 MG/50ML-% IV SOLN
20.0000 mg | Freq: Once | INTRAVENOUS | Status: AC
  Administered 2020-08-14: 20 mg via INTRAVENOUS
  Filled 2020-08-14: qty 50

## 2020-08-14 MED ORDER — OXYCODONE-ACETAMINOPHEN 5-325 MG PO TABS: 1.0000 | ORAL_TABLET | ORAL | 0 refills | Status: DC | PRN

## 2020-08-14 MED ORDER — SODIUM CHLORIDE 0.9 % IV BOLUS
1000.0000 mL | Freq: Once | INTRAVENOUS | Status: AC
  Administered 2020-08-14: 1000 mL via INTRAVENOUS

## 2020-08-14 MED ORDER — ONDANSETRON 4 MG PO TBDP: 4.0000 mg | ORAL_TABLET | Freq: Three times a day (TID) | ORAL | 0 refills | Status: DC | PRN

## 2020-08-14 MED ORDER — FENTANYL CITRATE (PF) 100 MCG/2ML IJ SOLN
50.0000 ug | Freq: Once | INTRAMUSCULAR | Status: AC
  Administered 2020-08-14: 50 ug via INTRAVENOUS
  Filled 2020-08-14: qty 2

## 2020-08-14 NOTE — Discharge Instructions (Addendum)
1. Take medicines as needed for pain & nausea (Percocet/Zofran #30). 2. Clear liquids x 12 hours, then bland diet x 1 week, then slowly advance diet as tolerated. Avoid fatty, greasy, spicy foods and drinks. 3. Return to the ER for worsening symptoms, persistent vomiting, fever, difficulty breathing or other concerns.  

## 2020-08-14 NOTE — ED Provider Notes (Signed)
[facility] Emergency Department Provider Note   ____________________________________________   Event Date/Time   First MD Initiated Contact with Patient 08/14/20 0128     (approximate)  I have reviewed the triage vital signs and the nursing notes.   HISTORY  Chief Complaint Abdominal Pain and Chest Pain    HPI Michele [name] is a 35 y.o. female who presents to the ED from home with a chief complaint of epigastric pain radiating towards her chest and into her back.  Reports sharp pain associated with shortness of breath and nausea.  History of Roux-en-Y gastric bypass in 2021.  Called EMS and when there was no MI demonstrated on EKG she drove herself to the ED.  Also reporting headaches, dizziness, weakness.  Denies fever, cough, vomiting, dysuria or diarrhea.  Denies COVID concerns.      Past Medical History:  Diagnosis Date  . Anemia during pregnancy in second trimester 06/14/2017  . Anxiety   . Depression     Patient Active Problem List   Diagnosis Date Noted  . Iron deficiency anemia 08/20/2019  . OSA (obstructive sleep apnea) 11/01/2018  . Anxiety 09/29/2018  . Depression 09/29/2018  . Cesarean delivery delivered 11/18/2017  . S/P cesarean section 11/18/2017  . Morbid obesity with BMI of 40.0-44.9, adult (HCC) 11/03/2017  . Restless leg syndrome 06/14/2017  . Primary insomnia 06/14/2017  . Anemia during pregnancy in third trimester 06/14/2017  . Major depressive disorder, recurrent, severe without psychotic features (HCC)   . Intentional opiate overdose (HCC) 09/14/2014  . Suicidal ideation 09/14/2014    Past Surgical History:  Procedure Laterality Date  . CESAREAN SECTION     times 4  . CESAREAN SECTION WITH BILATERAL TUBAL LIGATION N/A 11/18/2017   Procedure: CESAREAN SECTION WITH BILATERAL TUBAL LIGATION;  Surgeon: Natale Milch, MD;  Location: [facility] ORS;  Service: Obstetrics;  Laterality: N/A;  . KNEE SURGERY    .  LAPAROSCOPIC ABDOMINAL EXPLORATION     35 years old  . TONSILLECTOMY      Prior to Admission medications   Medication Sig Start Date End Date Taking? Authorizing Provider  ferrous sulfate 325 (65 FE) MG tablet Take 1 tablet (325 mg total) by mouth 2 (two) times daily with a meal. 11/20/17   Nadara Mustard, MD  hydrochlorothiazide (HYDRODIURIL) 25 MG tablet Take 25 mg by mouth daily. Patient not taking: Reported on 08/25/2019 07/07/19   [provider]    Allergies Penicillins and Sulfa antibiotics  Family History  Problem Relation Age of Onset  . Mental illness Father   . Depression Father   . Mental illness Sister   . Schizophrenia Sister   . Depression Mother   . Cancer - Cervical Paternal Grandmother 74    Social History Social History   Tobacco Use  . Smoking status: Never  . Smokeless tobacco: Never  Vaping Use  . Vaping Use: Never used  Substance Use Topics  . Alcohol use: No    Comment: rare  . Drug use: No    Review of Systems  Constitutional: No fever/chills Eyes: No visual changes. ENT: No sore throat. Cardiovascular: Denies chest pain. Respiratory: Denies shortness of breath. Gastrointestinal: Positive for abdominal pain and nausea, no vomiting.  No diarrhea.  No constipation. Genitourinary: Negative for dysuria. Musculoskeletal: Negative for back pain. Skin: Negative for rash. Neurological: Negative for headaches, focal weakness or numbness.   ____________________________________________   PHYSICAL EXAM:  VITAL SIGNS: ED Triage Vitals  Enc Vitals Group     BP 08/13/20 2300 121/82     Pulse Rate 08/13/20 2300 85     Resp 08/13/20 2300 16     Temp 08/13/20 2300 98.3 F (36.8 C)     Temp Source 08/13/20 2300 Oral     SpO2 08/13/20 2300 100 %     Weight 08/13/20 2258 154 lb (69.9 kg)     Height --      Head Circumference --      Peak Flow --      Pain Score 08/13/20 2258 8     Pain Loc --      Pain Edu? --      Excl. in GC? --      Constitutional: Alert and oriented. Well appearing and in mild acute distress. Eyes: Conjunctivae are normal. PERRL. EOMI. Head: Atraumatic. Nose: No congestion/rhinnorhea. Mouth/Throat: Mucous membranes are moist.   Neck: No stridor.   Cardiovascular: Normal rate, regular rhythm. Grossly normal heart sounds.  Good peripheral circulation. Respiratory: Normal respiratory effort.  No retractions. Lungs CTAB. Gastrointestinal: Soft and mild to moderately tender to palpation epigastrium and right upper quadrant without rebound or guarding. No distention. No abdominal bruits. No CVA tenderness. Musculoskeletal: No lower extremity tenderness nor edema.  No joint effusions. Neurologic:  Normal speech and language. No gross focal neurologic deficits are appreciated. No gait instability. Skin:  Skin is warm, dry and intact. No rash noted. Psychiatric: Mood and affect are normal. Speech and behavior are normal.  ____________________________________________   LABS (all labs ordered are listed, but only abnormal results are displayed)  Labs Reviewed  CBC - Abnormal; Notable for the following components:      Result Value   RBC 3.49 (*)    Hemoglobin 10.1 (*)    HCT 29.8 (*)    All other components within normal limits  HEPATIC FUNCTION PANEL - Abnormal; Notable for the following components:   AST 49 (*)    All other components within normal limits  BASIC METABOLIC PANEL  LIPASE, BLOOD  HCG, QUANTITATIVE, PREGNANCY  POC URINE PREG, ED  TROPONIN I (HIGH SENSITIVITY)  TROPONIN I (HIGH SENSITIVITY)   ____________________________________________  EKG  ED ECG REPORT I, Tishawn Friedhoff J, the attending physician, personally viewed and interpreted this ECG.   Date: 08/14/2020  EKG Time: 2258  Rate: 78  Rhythm: normal EKG, normal sinus rhythm  Axis: Normal  Intervals:none  ST&T Change: Nonspecific   ____________________________________________  RADIOLOGY I, Adelita Hone J, personally  viewed and evaluated these images (plain radiographs) as part of my medical decision making, as well as reviewing the written report by the radiologist.  ED MD interpretation: No acute cardiopulmonary process; ultrasound demonstrates cholelithiasis without cholecystitis  Official radiology report(s): DG Chest 2 View  Result Date: 08/13/2020 CLINICAL DATA:  Chest pain EXAM: CHEST - 2 VIEW COMPARISON:  05/24/2018 FINDINGS: The heart size and mediastinal contours are within normal limits. Both lungs are clear. The visualized skeletal structures are unremarkable. IMPRESSION: No active cardiopulmonary disease. Electronically Signed   By: Deatra Robinson M.D.   On: 08/13/2020 23:23   US ABDOMEN LIMITED RUQ (LIVER/GB)  Result Date: 08/14/2020 CLINICAL DATA:  Epigastric EXAM: ULTRASOUND ABDOMEN LIMITED RIGHT UPPER QUADRANT COMPARISON:  CT and ultrasound 12/31/2012 FINDINGS: Gallbladder: Layering stones and sludge within the gallbladder. Stones measure up to 3 mm. No wall thickening. Negative sonographic Murphy sign. Common bile duct: Diameter: Normal caliber, 4 mm. Liver: No focal lesion identified. Within normal limits in parenchymal  echogenicity. Portal vein is patent on color Doppler imaging with normal direction of blood flow towards the liver. Other: None. IMPRESSION: Cholelithiasis.  No sonographic evidence of acute cholecystitis. Electronically Signed   By: Charlett Nose M.D.   On: 08/14/2020 03:27    ____________________________________________   PROCEDURES  Procedure(s) performed (including Critical Care):  Procedures   ____________________________________________   INITIAL IMPRESSION / ASSESSMENT AND PLAN / ED COURSE  As part of my medical decision making, I reviewed the following data within the electronic MEDICAL RECORD NUMBER Nursing notes reviewed and incorporated, Labs reviewed, EKG interpreted, Old chart reviewed, Radiograph reviewed, and Notes from prior ED visits     35 year old  female presenting with upper abdominal pain Differential diagnosis includes, but is not limited to, biliary disease (biliary colic, acute cholecystitis, cholangitis, choledocholithiasis, etc), intrathoracic causes for epigastric abdominal pain including ACS, gastritis, duodenitis, pancreatitis, small bowel or large bowel obstruction, abdominal aortic aneurysm, hernia, and ulcer(s).   Laboratory results unremarkable.  Will obtain RUQ ultrasound.  Administer IV fentanyl for pain paired with IV Zofran for nausea.  Add IV Pepcid and reassess.  Clinical Course as of 08/14/20 0353  Wed Aug 14, 2020  2633 Patient asleep, awaken her for update.  Will discharge home with prescriptions for Percocet and Zofran to use as needed, follow bland diet and to contact her bariatric surgeon for follow-up.  Strict return precautions given.  Patient verbalizes understanding agrees with plan of care. [JS]    Clinical Course User Index [JS] Irean Hong, MD     ____________________________________________   FINAL CLINICAL IMPRESSION(S) / ED DIAGNOSES  Final diagnoses:  Pain of upper abdomen  Biliary colic  Calculus of gallbladder without cholecystitis without obstruction     ED Discharge Orders     None        Note:  This document was prepared using Dragon voice recognition software and may include unintentional dictation errors.    Irean Hong, MD 08/14/20 [phone]

## 2020-10-09 ENCOUNTER — Encounter: Payer: Self-pay | Admitting: Oncology

## 2020-10-21 ENCOUNTER — Ambulatory Visit: Payer: Self-pay | Admitting: Urology

## 2020-10-22 ENCOUNTER — Encounter: Payer: Self-pay | Admitting: Urology

## 2020-11-03 ENCOUNTER — Emergency Department: Payer: BC Managed Care – PPO

## 2020-11-03 ENCOUNTER — Other Ambulatory Visit: Payer: Self-pay

## 2020-11-03 ENCOUNTER — Emergency Department
Admission: EM | Admit: 2020-11-03 | Discharge: 2020-11-04 | Disposition: A | Discharge status: Home / Self Care | Payer: BC Managed Care – PPO | Attending: Emergency Medicine | Admitting: Emergency Medicine

## 2020-11-03 ENCOUNTER — Encounter: Payer: Self-pay | Admitting: Emergency Medicine

## 2020-11-03 DIAGNOSIS — R42 Dizziness and giddiness: Secondary | ICD-10-CM | POA: Diagnosis not present

## 2020-11-03 DIAGNOSIS — K2900 Acute gastritis without bleeding: Secondary | ICD-10-CM | POA: Diagnosis not present

## 2020-11-03 DIAGNOSIS — K209 Esophagitis, unspecified without bleeding: Secondary | ICD-10-CM | POA: Diagnosis not present

## 2020-11-03 DIAGNOSIS — R1013 Epigastric pain: Secondary | ICD-10-CM | POA: Diagnosis present

## 2020-11-03 LAB — URINALYSIS, COMPLETE (UACMP) WITH MICROSCOPIC
Bacteria, UA: NONE SEEN
Bilirubin Urine: NEGATIVE
Glucose, UA: NEGATIVE mg/dL
Leukocytes,Ua: NEGATIVE
Nitrite: NEGATIVE
Protein, ur: NEGATIVE mg/dL
Specific Gravity, Urine: 1.02 (ref 1.005–1.030)
pH: 6 (ref 5.0–8.0)

## 2020-11-03 LAB — LIPASE, BLOOD: Lipase: 32 U/L (ref 11–51)

## 2020-11-03 LAB — CBC
HCT: 26.9 % — ABNORMAL LOW (ref 36.0–46.0)
Hemoglobin: 8.9 g/dL — ABNORMAL LOW (ref 12.0–15.0)
MCH: 28.5 pg (ref 26.0–34.0)
MCHC: 33.1 g/dL (ref 30.0–36.0)
MCV: 86.2 fL (ref 80.0–100.0)
Platelets: 310 10*3/uL (ref 150–400)
RBC: 3.12 MIL/uL — ABNORMAL LOW (ref 3.87–5.11)
RDW: 12.9 % (ref 11.5–15.5)
WBC: 7.5 10*3/uL (ref 4.0–10.5)
nRBC: 0 % (ref 0.0–0.2)

## 2020-11-03 LAB — BASIC METABOLIC PANEL
Anion gap: 4 — ABNORMAL LOW (ref 5–15)
BUN: 17 mg/dL (ref 6–20)
CO2: 29 mmol/L (ref 22–32)
Calcium: 8.9 mg/dL (ref 8.9–10.3)
Chloride: 103 mmol/L (ref 98–111)
Creatinine, Ser: 0.67 mg/dL (ref 0.44–1.00)
GFR, Estimated: >60 mL/min (ref >60)
Glucose, Bld: 97 mg/dL (ref 70–99)
Potassium: 3.6 mmol/L (ref 3.5–5.1)
Sodium: 136 mmol/L (ref 135–145)

## 2020-11-03 LAB — POC URINE PREG, ED: Preg Test, Ur: NEGATIVE

## 2020-11-03 LAB — TROPONIN I (HIGH SENSITIVITY): Troponin I (High Sensitivity): 3 ng/L (ref <18)

## 2020-11-03 MED ORDER — SUCRALFATE 1 G PO TABS
1.0000 g | ORAL_TABLET | Freq: Once | ORAL | Status: AC
  Administered 2020-11-03: 1 g via ORAL
  Filled 2020-11-03: qty 1

## 2020-11-03 MED ORDER — MORPHINE SULFATE (PF) 4 MG/ML IV SOLN
4.0000 mg | Freq: Once | INTRAVENOUS | Status: AC
Start: 2020-11-03 — End: 2020-11-03
  Administered 2020-11-03: 4 mg via INTRAVENOUS
  Filled 2020-11-03: qty 1

## 2020-11-03 MED ORDER — LIDOCAINE VISCOUS HCL 2 % MT SOLN
15.0000 mL | Freq: Once | OROMUCOSAL | Status: AC
  Administered 2020-11-03: 15 mL via ORAL
  Filled 2020-11-03: qty 15

## 2020-11-03 MED ORDER — LACTATED RINGERS IV BOLUS
1000.0000 mL | Freq: Once | INTRAVENOUS | Status: AC
  Administered 2020-11-03: 1000 mL via INTRAVENOUS

## 2020-11-03 MED ORDER — IOHEXOL 350 MG/ML SOLN
80.0000 mL | Freq: Once | INTRAVENOUS | Status: AC | PRN
  Administered 2020-11-03: 80 mL via INTRAVENOUS

## 2020-11-03 MED ORDER — PANTOPRAZOLE SODIUM 40 MG IV SOLR
40.0000 mg | Freq: Once | INTRAVENOUS | Status: AC
  Administered 2020-11-03: 40 mg via INTRAVENOUS
  Filled 2020-11-03: qty 40

## 2020-11-03 MED ORDER — SODIUM CHLORIDE 0.9 % IV SOLN
2.0000 g | Freq: Once | INTRAVENOUS | Status: AC
  Administered 2020-11-03: 2 g via INTRAVENOUS
  Filled 2020-11-03: qty 20

## 2020-11-03 MED ORDER — ONDANSETRON HCL 4 MG/2ML IJ SOLN
4.0000 mg | Freq: Once | INTRAMUSCULAR | Status: AC
  Administered 2020-11-03: 4 mg via INTRAVENOUS
  Filled 2020-11-03: qty 2

## 2020-11-03 MED ORDER — ALUM & MAG HYDROXIDE-SIMETH 200-200-20 MG/5ML PO SUSP
30.0000 mL | Freq: Once | ORAL | Status: AC
  Administered 2020-11-03: 30 mL via ORAL
  Filled 2020-11-03: qty 30

## 2020-11-03 NOTE — ED Provider Notes (Signed)
[facility] Emergency Department Provider Note  ____________________________________________   Event Date/Time   First MD Initiated Contact with Patient 11/03/20 2032     (approximate)  I have reviewed the triage vital signs and the nursing notes.   HISTORY  Chief Complaint No chief complaint on file.    HPI Michele [name] is a 35 y.o. female with past medical history as below here with abdominal pain, nausea. The patient states that she recently had a tooth pulled.  She has been having infection in the tooth and has been on clindamycin.  She is also been on ibuprofen and was recently started on methylprednisolone for ongoing pain and swelling.  She states that shortly after starting the methylprednisolone, she developed progressively worsening, aching, burning, severe, epigastric pain with nausea.  She cannot vomit after her bypass but feels like she should.  She has had difficulty keeping food down.  She said generalized weakness and lightheadedness.  She has been mildly constipated.  Symptoms are worse with any attempted eating or drinking.  No alleviating factors.      Past Medical History:  Diagnosis Date   Anemia during pregnancy in second trimester 06/14/2017   Anxiety    Depression     Patient Active Problem List   Diagnosis Date Noted   Iron deficiency anemia 08/20/2019   OSA (obstructive sleep apnea) 11/01/2018   Anxiety 09/29/2018   Depression 09/29/2018   Cesarean delivery delivered 11/18/2017   S/P cesarean section 11/18/2017   Morbid obesity with BMI of 40.0-44.9, adult (HCC) 11/03/2017   Restless leg syndrome 06/14/2017   Primary insomnia 06/14/2017   Anemia during pregnancy in third trimester 06/14/2017   Major depressive disorder, recurrent, severe without psychotic features (HCC)    Intentional opiate overdose (HCC) 09/14/2014   Suicidal ideation 09/14/2014    Past Surgical History:  Procedure Laterality Date   CESAREAN  SECTION     times 4   CESAREAN SECTION WITH BILATERAL TUBAL LIGATION N/A 11/18/2017   Procedure: CESAREAN SECTION WITH BILATERAL TUBAL LIGATION;  Surgeon: Natale MilchSchuman, Christanna R, MD;  Location: [facility] ORS;  Service: Obstetrics;  Laterality: N/A;   CHOLECYSTECTOMY     KNEE SURGERY     LAPAROSCOPIC ABDOMINAL EXPLORATION     35 years old   TONSILLECTOMY      Prior to Admission medications   Medication Sig Start Date End Date Taking? Authorizing Provider  HYDROcodone-acetaminophen (NORCO/VICODIN) 5-325 MG tablet Take 1-2 tablets by mouth every 6 (six) hours as needed for moderate pain or severe pain. 11/04/20 11/04/21 Yes Shaune PollackIsaacs, Jaleiyah Alas, MD  pantoprazole (PROTONIX) 40 MG tablet Take 1 tablet (40 mg total) by mouth 2 (two) times daily for 10 days. 11/04/20 11/14/20 Yes Shaune PollackIsaacs, Joleen Stuckert, MD  sucralfate (CARAFATE) 1 g tablet Take 1 tablet (1 g total) by mouth 4 (four) times daily for 7 days. 11/04/20 11/11/20 Yes Shaune PollackIsaacs, Kataya Guimont, MD  ferrous sulfate 325 (65 FE) MG tablet Take 1 tablet (325 mg total) by mouth 2 (two) times daily with a meal. 11/20/17   Nadara MustardHarris, Robert P, MD  hydrochlorothiazide (HYDRODIURIL) 25 MG tablet Take 25 mg by mouth daily. Patient not taking: Reported on 08/25/2019 07/07/19   [provider]  ondansetron (ZOFRAN ODT) 4 MG disintegrating tablet Take 1 tablet (4 mg total) by mouth every 8 (eight) hours as needed for nausea or vomiting. 08/14/20   Irean HongSung, Jade J, MD  oxyCODONE-acetaminophen (PERCOCET/ROXICET) 5-325 MG tablet Take 1 tablet by mouth every 4 (four) hours  as needed for severe pain. 08/14/20   Irean Hong, MD    Allergies Penicillins and Sulfa antibiotics  Family History  Problem Relation Age of Onset   Mental illness Father    Depression Father    Mental illness Sister    Schizophrenia Sister    Depression Mother    Cancer - Cervical Paternal Grandmother 6    Social History Social History   Tobacco Use   Smoking status: Never   Smokeless tobacco: Never   Vaping Use   Vaping Use: Never used  Substance Use Topics   Alcohol use: No    Comment: rare   Drug use: No    Review of Systems  Review of Systems  Constitutional:  Positive for fatigue. Negative for fever.  HENT:  Negative for congestion and sore throat.   Eyes:  Negative for visual disturbance.  Respiratory:  Negative for cough and shortness of breath.   Cardiovascular:  Negative for chest pain.  Gastrointestinal:  Positive for abdominal pain, nausea and vomiting. Negative for diarrhea.  Genitourinary:  Negative for flank pain.  Musculoskeletal:  Negative for back pain and neck pain.  Skin:  Negative for rash and wound.  Neurological:  Positive for weakness.  All other systems reviewed and are negative.   ____________________________________________  PHYSICAL EXAM:      VITAL SIGNS: ED Triage Vitals  Enc Vitals Group     BP 11/03/20 1813 124/88     Pulse Rate 11/03/20 1813 94     Resp 11/03/20 1813 20     Temp 11/03/20 1813 99 F (37.2 C)     Temp Source 11/03/20 1813 Oral     SpO2 11/03/20 1813 100 %     Weight 11/03/20 1814 145 lb (65.8 kg)     Height 11/03/20 1814 5\' 3"  (1.6 m)     Head Circumference --      Peak Flow --      Pain Score 11/03/20 1814 8     Pain Loc --      Pain Edu? --      Excl. in GC? --      Physical Exam Vitals and nursing note reviewed.  Constitutional:      General: She is not in acute distress.    Appearance: She is well-developed.  HENT:     Head: Normocephalic and atraumatic.  Eyes:     Conjunctiva/sclera: Conjunctivae normal.  Cardiovascular:     Rate and Rhythm: Normal rate and regular rhythm.     Heart sounds: Normal heart sounds. No murmur heard.   No friction rub.  Pulmonary:     Effort: Pulmonary effort is normal. No respiratory distress.     Breath sounds: Normal breath sounds. No wheezing or rales.  Abdominal:     General: There is no distension.     Palpations: Abdomen is soft.     Tenderness: There is  abdominal tenderness (Primarily epigastric).  Musculoskeletal:     Cervical back: Neck supple.  Skin:    General: Skin is warm.     Capillary Refill: Capillary refill takes less than 2 seconds.  Neurological:     Mental Status: She is alert and oriented to person, place, and time.     Motor: No abnormal muscle tone.      ____________________________________________   LABS (all labs ordered are listed, but only abnormal results are displayed)  Labs Reviewed  BASIC METABOLIC PANEL - Abnormal; Notable for the following components:  Result Value   Anion gap 4 (*)    All other components within normal limits  CBC - Abnormal; Notable for the following components:   RBC 3.12 (*)    Hemoglobin 8.9 (*)    HCT 26.9 (*)    All other components within normal limits  URINALYSIS, COMPLETE (UACMP) WITH MICROSCOPIC - Abnormal; Notable for the following components:   Hgb urine dipstick TRACE (*)    Ketones, ur TRACE (*)    All other components within normal limits  LIPASE, BLOOD  POC URINE PREG, ED  TROPONIN I (HIGH SENSITIVITY)    ____________________________________________  EKG: Normal sinus rhythm, ventricular rate 82.  PR 132, cures 70, QTc 394.  No acute ST elevations or depressions. ________________________________________  RADIOLOGY All imaging, including plain films, CT scans, and ultrasounds, independently reviewed by me, and interpretations confirmed via formal radiology reads.  ED MD interpretation:   Chest x-ray: No active disease CT abdomen/pelvis: No acute abnormality, gastric bypass without complication  Official radiology report(s): DG Chest 2 View  Result Date: 11/03/2020 CLINICAL DATA:  Chest pain EXAM: CHEST - 2 VIEW COMPARISON:  Chest radiograph dated 08/13/2020. FINDINGS: The heart size and mediastinal contours are within normal limits. Both lungs are clear. The visualized skeletal structures are unremarkable. IMPRESSION: No active cardiopulmonary disease.  Electronically Signed   By: Romona Curls M.D.   On: 11/03/2020 18:48   CT ABDOMEN PELVIS W CONTRAST  Result Date: 11/03/2020 CLINICAL DATA:  Upper abdominal pain. EXAM: CT ABDOMEN AND PELVIS WITH CONTRAST TECHNIQUE: Multidetector CT imaging of the abdomen and pelvis was performed using the standard protocol following bolus administration of intravenous contrast. CONTRAST:  63mL OMNIPAQUE IOHEXOL 350 MG/ML SOLN COMPARISON:  Abdominal ultrasound 08/14/2020.  CT 12/31/2012 FINDINGS: Lower chest: Clear lung bases. No focal airspace disease or pleural fluid. The heart is normal in size. Hepatobiliary: No focal hepatic abnormality. Cholecystectomy without biliary ductal dilatation. Pancreas: No ductal dilatation or inflammation. Spleen: Upper normal in size at 12.7 cm.  No focal abnormality. Adrenals/Urinary Tract: Normal adrenal glands. Punctate nonobstructing stone in the lower left kidney. No hydronephrosis. No ureteral calculi or renal obstruction. Urinary bladder is partially distended without wall thickening. Stomach/Bowel: Gastric bypass anatomy. The excluded gastric remnant is decompressed. The Roux limb is nondilated. Jejunal anastomosis is normal. No small bowel obstruction or inflammation. Small volume of colonic stool. Normal appendix medial to the cecum. Vascular/Lymphatic: Normal caliber abdominal aorta. Patent portal vein. No acute vascular findings. No enlarged lymph nodes in the abdomen or pelvis Reproductive: Uterus is deviated into the anterior pelvis likely related to prior cesarean section. No adnexal mass. Other: Small amount of free fluid in the dependent pelvis is likely physiologic. There is no free air or focal fluid collection. Musculoskeletal: There are no acute or suspicious osseous abnormalities. IMPRESSION: 1. No acute abnormality or explanation for abdominal pain. 2. Gastric bypass anatomy without complication. 3. Punctate nonobstructing stone in the lower left kidney. Electronically  Signed   By: Narda Rutherford M.D.   On: 11/03/2020 21:59    ____________________________________________  PROCEDURES   Procedure(s) performed (including Critical Care):  Procedures  ____________________________________________  INITIAL IMPRESSION / MDM / ASSESSMENT AND PLAN / ED COURSE  As part of my medical decision making, I reviewed the following data within the electronic MEDICAL RECORD NUMBER Nursing notes reviewed and incorporated, Old chart reviewed, Notes from prior ED visits, and [place] Controlled Substance Database       *Kehaulani Starsha Morning was evaluated in Emergency  Department on 11/04/2020 for the symptoms described in the history of present illness. She was evaluated in the context of the global COVID-19 pandemic, which necessitated consideration that the patient might be at risk for infection with the SARS-CoV-2 virus that causes COVID-19. Institutional protocols and algorithms that pertain to the evaluation of patients at risk for COVID-19 are in a state of rapid change based on information released by regulatory bodies including the CDC and federal and state organizations. These policies and algorithms were followed during the patient's care in the ED.  Some ED evaluations and interventions may be delayed as a result of limited staffing during the pandemic.*     Medical Decision Making:  35 yo F here with nausea, vomiting. Suspect acute gastritis/esophagitis due to NSAID and prednisone use, with h/o gastric bypass. Pt was recently rx'ed these for dental infection and sx correlate with use of meds. From that perspective, no evidence of perforation or complication. Hgb is stable. No leukocytosis or fever. CT a/p obtained, reviewed, and is unremarkable. Lytes wnl, normal renal function. EKG nonischemic, trop neg and do not suspect this is ACS. CXR clear.   Following GI cocktail and protonix, tp had significant improvement in sx. Will have her d/c her NSAIDs and prednisone, start PPI  and carafate with outpt GI f/u. For her dental infection, this seems to be improving though persistent. She was given a dose of Rocephin here and advised to f/u with her dentist. No signs of ludwig's or neck involvement extending to the chest/area of her pain.  ____________________________________________  FINAL CLINICAL IMPRESSION(S) / ED DIAGNOSES  Final diagnoses:  Acute superficial gastritis without hemorrhage  Esophagitis     MEDICATIONS GIVEN DURING THIS VISIT:  Medications  pantoprazole (PROTONIX) injection 40 mg (40 mg Intravenous Given 11/03/20 2132)  alum & mag hydroxide-simeth (MAALOX/MYLANTA) 200-200-20 MG/5ML suspension 30 mL (30 mLs Oral Given 11/03/20 2132)    And  lidocaine (XYLOCAINE) 2 % viscous mouth solution 15 mL (15 mLs Oral Given 11/03/20 2132)  morphine 4 MG/ML injection 4 mg (4 mg Intravenous Given 11/03/20 2132)  ondansetron (ZOFRAN) injection 4 mg (4 mg Intravenous Given 11/03/20 2132)  lactated ringers bolus 1,000 mL (0 mLs Intravenous Stopped 11/03/20 2251)  iohexol (OMNIPAQUE) 350 MG/ML injection 80 mL (80 mLs Intravenous Contrast Given 11/03/20 2144)  morphine 4 MG/ML injection 4 mg (4 mg Intravenous Given 11/03/20 2251)  sucralfate (CARAFATE) tablet 1 g (1 g Oral Given 11/03/20 2251)  cefTRIAXone (ROCEPHIN) 2 g in sodium chloride 0.9 % 100 mL IVPB (0 g Intravenous Stopped 11/03/20 2345)     ED Discharge Orders          Ordered    pantoprazole (PROTONIX) 40 MG tablet  2 times daily        11/04/20 0028    sucralfate (CARAFATE) 1 g tablet  4 times daily        11/04/20 0028    HYDROcodone-acetaminophen (NORCO/VICODIN) 5-325 MG tablet  Every 6 hours PRN        11/04/20 0028             Note:  This document was prepared using Dragon voice recognition software and may include unintentional dictation errors.   Shaune Pollack, MD 11/04/20 [phone]

## 2020-11-03 NOTE — ED Triage Notes (Cosign Needed Addendum)
Pt to ED POV c/o upper abdominal pain and mid sternal chest pain that started 3 days ago. pt describes as burning and aching. Pt states she did have SOB yesterday but is currently not SOB and denies cough. Pt also complaining of dizziness/lightheadedness.   Pt had teeth pulled out last Monday, and is now on antibiotics due to infection. Pt states that her symptoms started when she began the antibiotics.  Pt also had gallbladder surgery around a month ago, denies NVD  A&O x4, NAD.

## 2020-11-04 MED ORDER — SUCRALFATE 1 G PO TABS: 1.0000 g | ORAL_TABLET | Freq: Four times a day (QID) | ORAL | 0 refills | Status: DC

## 2020-11-04 MED ORDER — PANTOPRAZOLE SODIUM 40 MG PO TBEC: 40.0000 mg | DELAYED_RELEASE_TABLET | Freq: Two times a day (BID) | ORAL | 0 refills | Status: DC

## 2020-11-04 MED ORDER — HYDROCODONE-ACETAMINOPHEN 5-325 MG PO TABS: 1.0000 | ORAL_TABLET | Freq: Four times a day (QID) | ORAL | 0 refills | Status: AC | PRN

## 2020-11-04 NOTE — Discharge Instructions (Signed)
Take the antacids as prescribed  STOP the ibuprofen and prednisone/steroids  RESUME the antibiotic tomorrow  Follow-up with a GI physician in 2-3 weeks

## 2021-04-07 ENCOUNTER — Ambulatory Visit: Payer: BC Managed Care – PPO | Admitting: Urology

## 2021-04-22 ENCOUNTER — Other Ambulatory Visit: Payer: Self-pay | Admitting: Family Medicine

## 2021-04-23 ENCOUNTER — Other Ambulatory Visit: Payer: Self-pay | Admitting: Family Medicine

## 2021-04-23 ENCOUNTER — Other Ambulatory Visit (HOSPITAL_COMMUNITY): Payer: Self-pay | Admitting: Family Medicine

## 2021-04-23 DIAGNOSIS — R591 Generalized enlarged lymph nodes: Secondary | ICD-10-CM

## 2021-04-29 ENCOUNTER — Encounter: Payer: Self-pay | Admitting: Oncology

## 2021-05-01 ENCOUNTER — Ambulatory Visit
Admission: RE | Admit: 2021-05-01 | Discharge: 2021-05-01 | Disposition: A | Discharge status: Home / Self Care | Payer: BC Managed Care – PPO | Source: Ambulatory Visit | Attending: Family Medicine | Admitting: Family Medicine

## 2021-05-01 DIAGNOSIS — R591 Generalized enlarged lymph nodes: Secondary | ICD-10-CM | POA: Insufficient documentation

## 2021-05-12 ENCOUNTER — Ambulatory Visit: Payer: BC Managed Care – PPO | Admitting: Urology

## 2021-06-12 ENCOUNTER — Encounter: Payer: Self-pay | Admitting: Emergency Medicine

## 2021-06-12 ENCOUNTER — Other Ambulatory Visit: Payer: Self-pay

## 2021-06-12 ENCOUNTER — Emergency Department
Admission: EM | Admit: 2021-06-12 | Discharge: 2021-06-12 | Disposition: A | Discharge status: Home / Self Care | Payer: BC Managed Care – PPO | Attending: Emergency Medicine | Admitting: Emergency Medicine

## 2021-06-12 DIAGNOSIS — R002 Palpitations: Secondary | ICD-10-CM | POA: Diagnosis not present

## 2021-06-12 DIAGNOSIS — D649 Anemia, unspecified: Secondary | ICD-10-CM | POA: Insufficient documentation

## 2021-06-12 LAB — COMPREHENSIVE METABOLIC PANEL
ALT: 19 U/L (ref 0–44)
AST: 25 U/L (ref 15–41)
Albumin: 3.8 g/dL (ref 3.5–5.0)
Alkaline Phosphatase: 104 U/L (ref 38–126)
Anion gap: 5 (ref 5–15)
BUN: 9 mg/dL (ref 6–20)
CO2: 30 mmol/L (ref 22–32)
Calcium: 8.7 mg/dL — ABNORMAL LOW (ref 8.9–10.3)
Chloride: 105 mmol/L (ref 98–111)
Creatinine, Ser: 0.86 mg/dL (ref 0.44–1.00)
GFR, Estimated: >60 mL/min (ref >60)
Glucose, Bld: 79 mg/dL (ref 70–99)
Potassium: 3.8 mmol/L (ref 3.5–5.1)
Sodium: 140 mmol/L (ref 135–145)
Total Bilirubin: 0.4 mg/dL (ref 0.3–1.2)
Total Protein: 6.7 g/dL (ref 6.5–8.1)

## 2021-06-12 LAB — CBC
HCT: 23.9 % — ABNORMAL LOW (ref 36.0–46.0)
Hemoglobin: 7 g/dL — ABNORMAL LOW (ref 12.0–15.0)
MCH: 22.3 pg — ABNORMAL LOW (ref 26.0–34.0)
MCHC: 29.3 g/dL — ABNORMAL LOW (ref 30.0–36.0)
MCV: 76.1 fL — ABNORMAL LOW (ref 80.0–100.0)
Platelets: 368 10*3/uL (ref 150–400)
RBC: 3.14 MIL/uL — ABNORMAL LOW (ref 3.87–5.11)
RDW: 16.2 % — ABNORMAL HIGH (ref 11.5–15.5)
WBC: 5 10*3/uL (ref 4.0–10.5)
nRBC: 0 % (ref 0.0–0.2)

## 2021-06-12 LAB — HCG, QUANTITATIVE, PREGNANCY: hCG, Beta Chain, Quant, S: 1 m[IU]/mL (ref <5)

## 2021-06-12 LAB — PREPARE RBC (CROSSMATCH)

## 2021-06-12 MED ORDER — SODIUM CHLORIDE 0.9 % IV SOLN
10.0000 mL/h | Freq: Once | INTRAVENOUS | Status: AC
Start: 2021-06-12 — End: 2021-06-12
  Administered 2021-06-12: 10 mL/h via INTRAVENOUS

## 2021-06-12 NOTE — ED Notes (Addendum)
Edp notified that pt has itching after last transfusion, pt denies rash, SOB or trouble breathing at that time. Pt unsure of date. ?Per EDP,  RN will monitor for 30 minutes after transfusion finishes ?

## 2021-06-12 NOTE — Discharge Instructions (Addendum)
Please follow-up with your primary care provider as well as your gynecologist. ? ?Return to the emergency department for symptoms of concern. ?

## 2021-06-12 NOTE — ED Provider Notes (Signed)
? ?[facility] ?Provider Note ? ? ? Event Date/Time  ? First MD Initiated Contact with Patient 06/12/21 1651   ?  (approximate) ? ? ?History  ? ?Abnormal Lab ? ? ?HPI ? ?Michele [name] is a 36 y.o. female  history of gastric bypass and as listed in EMR presents to the emergency department for acute on chronic anemia. Evaluated by PCP and sent to ER for Hgb of 7.5 and transfusion. Heavy menstrual cycles likely the cause with LMP of 06/09/21. Takes iron and vitamins. Has required transfusion in the past. Symptoms today include headache, fatigue, dizziness, palpitations. ?  ?Physical Exam  ? ?Triage Vital Signs: ?ED Triage Vitals  ?Enc Vitals Group  ?   BP 06/12/21 1654 112/83  ?   Pulse Rate 06/12/21 1654 78  ?   Resp 06/12/21 1654 13  ?   Temp 06/12/21 1654 98.6 ?F (37 ?C)  ?   Temp Source 06/12/21 1654 Oral  ?   SpO2 06/12/21 1654 100 %  ?   Weight 06/12/21 1640 145 lb 1 oz (65.8 kg)  ?   Height 06/12/21 1640 5\' 3"  (1.6 m)  ?   Head Circumference --   ?   Peak Flow --   ?   Pain Score 06/12/21 1640 7  ?   Pain Loc --   ?   Pain Edu? --   ?   Excl. in GC? --   ? ? ?Most recent vital signs: ?Vitals:  ? 06/12/21 2000 06/12/21 2030  ?BP: 104/74 117/84  ?Pulse: 73 71  ?Resp: 16 17  ?Temp:    ?SpO2: 98% 100%  ? ? ?General: Awake, no distress.  ?CV:  Good peripheral perfusion.  ?Resp:  Normal effort.  ?Abd:  No distention.  ?Other:   ? ? ?ED Results / Procedures / Treatments  ? ?Labs ?(all labs ordered are listed, but only abnormal results are displayed) ?Labs Reviewed  ?COMPREHENSIVE METABOLIC PANEL - Abnormal; Notable for the following components:  ?    Result Value  ? Calcium 8.7 (*)   ? All other components within normal limits  ?CBC - Abnormal; Notable for the following components:  ? RBC 3.14 (*)   ? Hemoglobin 7.0 (*)   ? HCT 23.9 (*)   ? MCV 76.1 (*)   ? MCH 22.3 (*)   ? MCHC 29.3 (*)   ? RDW 16.2 (*)   ? All other components within normal limits  ?HCG, QUANTITATIVE, PREGNANCY  ?TYPE AND  SCREEN  ?PREPARE RBC (CROSSMATCH)  ? ? ? ?EKG ? ?Not indicated ? ? ?RADIOLOGY ? ?Image and radiology report reviewed by me. ? ?Patient refused pelvic ultrasound. ? ?PROCEDURES: ? ?Critical Care performed: Yes, see critical care procedure note(s) ? ?Procedures ? ? ?MEDICATIONS ORDERED IN ED: ?Medications  ?0.9 %  sodium chloride infusion (0 mL/hr Intravenous Stopped 06/12/21 2152)  ? ? ? ?IMPRESSION / MDM / ASSESSMENT AND PLAN / ED COURSE  ? ?I have reviewed the triage note. ? ?Differential diagnosis includes, but is not limited to symptomatic anemia. ? ?36 year old female presenting to the emergency department for treatment and evaluation of symptoms as described in the HPI.  Patient states that her primary care provider sent her here for transfusion. ? ?Discussed the likelihood of fibroids or other uterine/adnexal causes for excessive bleeding.  Offered to order ultrasound however patient refuses and states that she would prefer to have her OB/GYN or primary care provider order any additional  testing.  1 unit packed red blood cells ordered. ? ?Packed red blood cells infusing.  Patient denies any complaints at this time.  She will be discharged home above 30 minutes after transfusion as long as she is having no concerning symptoms. ? ?  ? ? ?FINAL CLINICAL IMPRESSION(S) / ED DIAGNOSES  ? ?Final diagnoses:  ?Symptomatic anemia  ? ? ? ?Rx / DC Orders  ? ?ED Discharge Orders   ? ? None  ? ?  ? ? ? ?Note:  This document was prepared using Dragon voice recognition software and may include unintentional dictation errors. ?  Chinita Pester, FNP ?06/13/21 1111 ? ?  ?Concha Se, MD ?06/18/21 0700 ? ?

## 2021-06-12 NOTE — ED Triage Notes (Signed)
Pt comes into the ED via POV c/o abnormal labs.  Pt sent by her MD due to hgb at 7.5.  Pt is s/p gastric bypass with poor iron absorption.  Pt currently takes ferritin.  Pt states she has been having palpitations, syncope, and weakness.  Pt currently ambulatory in NAD with even and unlabored respirations.   ?

## 2021-06-13 LAB — TYPE AND SCREEN
ABO/RH(D): O POS
Antibody Screen: NEGATIVE
Unit division: 0

## 2021-06-13 LAB — BPAM RBC
Blood Product Expiration Date: 202305082359
ISSUE DATE / TIME: 202304131854
Unit Type and Rh: 5100

## 2021-08-15 ENCOUNTER — Other Ambulatory Visit: Payer: Self-pay | Admitting: Family Medicine

## 2021-08-15 DIAGNOSIS — R109 Unspecified abdominal pain: Secondary | ICD-10-CM

## 2021-08-15 DIAGNOSIS — R319 Hematuria, unspecified: Secondary | ICD-10-CM

## 2021-08-29 ENCOUNTER — Ambulatory Visit
Admission: RE | Admit: 2021-08-29 | Discharge: 2021-08-29 | Disposition: A | Discharge status: Home / Self Care | Payer: BC Managed Care – PPO | Source: Ambulatory Visit | Attending: Family Medicine | Admitting: Family Medicine

## 2021-08-29 DIAGNOSIS — R109 Unspecified abdominal pain: Secondary | ICD-10-CM | POA: Diagnosis present

## 2021-08-29 DIAGNOSIS — R319 Hematuria, unspecified: Secondary | ICD-10-CM | POA: Diagnosis present

## 2021-10-23 ENCOUNTER — Emergency Department
Admission: EM | Admit: 2021-10-23 | Discharge: 2021-10-23 | Disposition: A | Discharge status: Home / Self Care | Payer: BC Managed Care – PPO | Attending: Student in an Organized Health Care Education/Training Program | Admitting: Student in an Organized Health Care Education/Training Program

## 2021-10-23 ENCOUNTER — Other Ambulatory Visit: Payer: Self-pay

## 2021-10-23 DIAGNOSIS — R5383 Other fatigue: Secondary | ICD-10-CM | POA: Diagnosis present

## 2021-10-23 DIAGNOSIS — D649 Anemia, unspecified: Secondary | ICD-10-CM | POA: Diagnosis not present

## 2021-10-23 DIAGNOSIS — Z20822 Contact with and (suspected) exposure to covid-19: Secondary | ICD-10-CM | POA: Diagnosis not present

## 2021-10-23 LAB — CBC WITH DIFFERENTIAL/PLATELET
Abs Immature Granulocytes: 0.02 10*3/uL (ref 0.00–0.07)
Basophils Absolute: 0 10*3/uL (ref 0.0–0.1)
Basophils Relative: 1 %
Eosinophils Absolute: 0.1 10*3/uL (ref 0.0–0.5)
Eosinophils Relative: 2 %
HCT: 24.4 % — ABNORMAL LOW (ref 36.0–46.0)
Hemoglobin: 7.3 g/dL — ABNORMAL LOW (ref 12.0–15.0)
Immature Granulocytes: 0 %
Lymphocytes Relative: 25 %
Lymphs Abs: 1.5 10*3/uL (ref 0.7–4.0)
MCH: 23.2 pg — ABNORMAL LOW (ref 26.0–34.0)
MCHC: 29.9 g/dL — ABNORMAL LOW (ref 30.0–36.0)
MCV: 77.7 fL — ABNORMAL LOW (ref 80.0–100.0)
Monocytes Absolute: 0.3 10*3/uL (ref 0.1–1.0)
Monocytes Relative: 5 %
Neutro Abs: 4.2 10*3/uL (ref 1.7–7.7)
Neutrophils Relative %: 67 %
Platelets: 329 10*3/uL (ref 150–400)
RBC: 3.14 MIL/uL — ABNORMAL LOW (ref 3.87–5.11)
RDW: 14.7 % (ref 11.5–15.5)
WBC: 6.2 10*3/uL (ref 4.0–10.5)
nRBC: 0 % (ref 0.0–0.2)

## 2021-10-23 LAB — COMPREHENSIVE METABOLIC PANEL
ALT: 19 U/L (ref 0–44)
AST: 26 U/L (ref 15–41)
Albumin: 4.1 g/dL (ref 3.5–5.0)
Alkaline Phosphatase: 97 U/L (ref 38–126)
Anion gap: 4 — ABNORMAL LOW (ref 5–15)
BUN: 7 mg/dL (ref 6–20)
CO2: 29 mmol/L (ref 22–32)
Calcium: 8.9 mg/dL (ref 8.9–10.3)
Chloride: 108 mmol/L (ref 98–111)
Creatinine, Ser: 0.69 mg/dL (ref 0.44–1.00)
GFR, Estimated: >60 mL/min (ref >60)
Glucose, Bld: 101 mg/dL — ABNORMAL HIGH (ref 70–99)
Potassium: 4 mmol/L (ref 3.5–5.1)
Sodium: 141 mmol/L (ref 135–145)
Total Bilirubin: 0.2 mg/dL — ABNORMAL LOW (ref 0.3–1.2)
Total Protein: 6.9 g/dL (ref 6.5–8.1)

## 2021-10-23 LAB — URINALYSIS, ROUTINE W REFLEX MICROSCOPIC
Bacteria, UA: NONE SEEN
Bilirubin Urine: NEGATIVE
Glucose, UA: NEGATIVE mg/dL
Ketones, ur: NEGATIVE mg/dL
Leukocytes,Ua: NEGATIVE
Nitrite: NEGATIVE
Protein, ur: NEGATIVE mg/dL
Specific Gravity, Urine: 1.009 (ref 1.005–1.030)
pH: 6 (ref 5.0–8.0)

## 2021-10-23 LAB — POC URINE PREG, ED: Preg Test, Ur: NEGATIVE

## 2021-10-23 LAB — PROTIME-INR
INR: 1.1 (ref 0.8–1.2)
Prothrombin Time: 14.3 seconds (ref 11.4–15.2)

## 2021-10-23 LAB — PREPARE RBC (CROSSMATCH)

## 2021-10-23 LAB — TROPONIN I (HIGH SENSITIVITY): Troponin I (High Sensitivity): <2 ng/L (ref <18)

## 2021-10-23 LAB — SARS CORONAVIRUS 2 BY RT PCR: SARS Coronavirus 2 by RT PCR: NEGATIVE

## 2021-10-23 MED ORDER — IBUPROFEN 600 MG PO TABS
600.0000 mg | ORAL_TABLET | Freq: Once | ORAL | Status: AC
  Administered 2021-10-23: 600 mg via ORAL
  Filled 2021-10-23: qty 1

## 2021-10-23 MED ORDER — SODIUM CHLORIDE 0.9 % IV SOLN: 10.0000 mL/h | Freq: Once | INTRAVENOUS | Status: DC

## 2021-10-23 MED ORDER — ACETAMINOPHEN 325 MG PO TABS
650.0000 mg | ORAL_TABLET | Freq: Once | ORAL | Status: AC
  Administered 2021-10-23: 650 mg via ORAL
  Filled 2021-10-23: qty 2

## 2021-10-23 NOTE — ED Triage Notes (Signed)
Pt states she has been feeling lousy for the past a few weeks. Pt states she had blood work done that showed a low hemoglobin and was told to come to ER. Pt states she had swollen lymph nodes as well. Pt denies blood in stool. Pt states period came 2 weeks early and has been heavier than normal. Pt pale, states she has been having chest palpitations as well and pressure in the back of her head. Pt has hx of blood transfusions.

## 2021-10-23 NOTE — ED Provider Notes (Signed)
[facility] Provider Note    Event Date/Time   First MD Initiated Contact with Patient 10/23/21 1714     (approximate)   History   abnormal labs and low hemoglobin   HPI  Michele [name] is a 36 y.o. female with a history of of anemia primarily iron deficiency anemia with reported history of menometrorrhagia presents to the ER for evaluation of generalized malaise fatigue feeling weak dropping hemoglobin.  Denies any melena or hematochezia no nosebleed not currently on any blood thinners.  Has been taking supplemental iron.  Has not seen a hematologist.  Was encouraged to come to the ER for blood transfusion by her outpatient clinic.     Physical Exam   Triage Vital Signs: ED Triage Vitals  Enc Vitals Group     BP 10/23/21 1442 129/86     Pulse Rate 10/23/21 1442 68     Resp 10/23/21 1442 18     Temp 10/23/21 1442 98.6 F (37 C)     Temp Source 10/23/21 1442 Oral     SpO2 10/23/21 1442 100 %     Weight 10/23/21 1443 134 lb (60.8 kg)     Height 10/23/21 1443 5\' 3"  (1.6 m)     Head Circumference --      Peak Flow --      Pain Score 10/23/21 1443 8     Pain Loc --      Pain Edu? --      Excl. in GC? --     Most recent vital signs: Vitals:   10/23/21 2100 10/23/21 2130  BP: 119/74 117/75  Pulse: 71 66  Resp: 13 16  Temp:  98.1 F (36.7 C)  SpO2: 100% 100%     Constitutional: Alert  Eyes: Conjunctivae are normal.  Head: Atraumatic. Nose: No congestion/rhinnorhea. Mouth/Throat: Mucous membranes are moist.   Neck: Painless ROM.  Cardiovascular:   Good peripheral circulation. Respiratory: Normal respiratory effort.  No retractions.  Gastrointestinal: Soft and nontender.  Musculoskeletal:  no deformity Neurologic:  MAE spontaneously. No gross focal neurologic deficits are appreciated.  Skin:  Skin is warm, dry and intact. No rash noted. Psychiatric: Mood and affect are normal. Speech and behavior are normal.    ED Results /  Procedures / Treatments   Labs (all labs ordered are listed, but only abnormal results are displayed) Labs Reviewed  CBC WITH DIFFERENTIAL/PLATELET - Abnormal; Notable for the following components:      Result Value   RBC 3.14 (*)    Hemoglobin 7.3 (*)    HCT 24.4 (*)    MCV 77.7 (*)    MCH 23.2 (*)    MCHC 29.9 (*)    All other components within normal limits  COMPREHENSIVE METABOLIC PANEL - Abnormal; Notable for the following components:   Glucose, Bld 101 (*)    Total Bilirubin 0.2 (*)    Anion gap 4 (*)    All other components within normal limits  URINALYSIS, ROUTINE W REFLEX MICROSCOPIC - Abnormal; Notable for the following components:   Color, Urine STRAW (*)    APPearance CLEAR (*)    Hgb urine dipstick MODERATE (*)    All other components within normal limits  SARS CORONAVIRUS 2 BY RT PCR  PROTIME-INR  POC URINE PREG, ED  TYPE AND SCREEN  PREPARE RBC (CROSSMATCH)  TROPONIN I (HIGH SENSITIVITY)     EKG  ED ECG REPORT I, 10/25/21, the attending physician, personally viewed and  interpreted this ECG.   Date: 10/23/2021  EKG Time: 14:53  Rate: 70  Rhythm: sinus  Axis: normal  Intervals: normal  ST&T Change: no stemi, no depression    RADIOLOGY Please see ED Course for my review and interpretation.  I personally reviewed all radiographic images ordered to evaluate for the above acute complaints and reviewed radiology reports and findings.  These findings were personally discussed with the patient.  Please see medical record for radiology report.    PROCEDURES:  Critical Care performed: Yes, see critical care procedure note(s)  .Critical Care  Performed by: Willy Eddy, MD Authorized by: Willy Eddy, MD   Critical care provider statement:    Critical care time (minutes):  34   Critical care was necessary to treat or prevent imminent or life-threatening deterioration of the following conditions:  Circulatory failure   Critical  care was time spent personally by me on the following activities:  Ordering and performing treatments and interventions, ordering and review of laboratory studies, ordering and review of radiographic studies, pulse oximetry, re-evaluation of patient's condition, review of old charts, obtaining history from patient or surrogate, examination of patient, evaluation of patient's response to treatment, discussions with primary provider, discussions with consultants and development of treatment plan with patient or surrogate    MEDICATIONS ORDERED IN ED: Medications  0.9 %  sodium chloride infusion (0 mL/hr Intravenous Hold 10/23/21 1946)  acetaminophen (TYLENOL) tablet 650 mg (650 mg Oral Given 10/23/21 1751)  ibuprofen (ADVIL) tablet 600 mg (600 mg Oral Given 10/23/21 2030)     IMPRESSION / MDM / ASSESSMENT AND PLAN / ED COURSE  I reviewed the triage vital signs and the nursing notes.                              Differential diagnosis includes, but is not limited to, antibiotic anemia, acute blood loss anemia, iron deficiency anemia, anemia of chronic disease  Patient presented to the ER for evaluation of symptoms as described above.  This presenting complaint could reflect a potentially life-threatening illness therefore the patient will be placed on continuous pulse oximetry and telemetry for monitoring.  Laboratory evaluation will be sent to evaluate for the above complaints.  Based on presentation I do not believe that imaging clinically indicated at this time.  He is denying any melena or symptoms of GI bleed.  Likely secondary to iron deficiency anemia which she has had in the past and menses.  She is not pregnant.  Given her respiratory symptoms will order transfusion 1 unit and reassess.    Clinical Course as of 10/23/21 2202  Thu Oct 23, 2021  2159 Patient reassessed and feels improved after transfusion.  At this point does appear stable and appropriate for outpatient follow-up. [PR]     Clinical Course User Index [PR] Willy Eddy, MD      FINAL CLINICAL IMPRESSION(S) / ED DIAGNOSES   Final diagnoses:  Symptomatic anemia     Rx / DC Orders   ED Discharge Orders     None        Note:  This document was prepared using Dragon voice recognition software and may include unintentional dictation errors.    Willy Eddy, MD 10/23/21 2203

## 2021-10-23 NOTE — ED Provider Triage Note (Signed)
Emergency Medicine Provider Triage Evaluation Note  Olyvia Gopal , a 36 y.o. female  was evaluated in triage.  Pt complains of headache, palpitations, CP. Was told her HgB was 7.7. Reports that her period is "thin like water." Reports period is 2 weeks early. Reports she has been feeling sick for 2 weeks. Day 2 of period. Has had blood transfusions and iron transfusions.  Review of Systems  Positive: Vaginal bleeding, palpitations, CP, SOB Negative: Abd pain, n/v/d, fever  Physical Exam  There were no vitals taken for this visit. Gen:   Awake, no distress   Resp:  Normal effort  MSK:   Moves extremities without difficulty  Other:    Medical Decision Making  Medically screening exam initiated at 2:39 PM.  Appropriate orders placed.  Mayerly Tiffannie Sloss was informed that the remainder of the evaluation will be completed by another provider, this initial triage assessment does not replace that evaluation, and the importance of remaining in the ED until their evaluation is complete.     Jackelyn Hoehn, PA-C 10/23/21 1443

## 2021-10-24 LAB — TYPE AND SCREEN
ABO/RH(D): O POS
Antibody Screen: NEGATIVE
Unit division: 0

## 2021-10-24 LAB — BPAM RBC
Blood Product Expiration Date: 202309292359
ISSUE DATE / TIME: 202308241933
Unit Type and Rh: 5100

## 2021-11-25 ENCOUNTER — Inpatient Hospital Stay: Payer: BC Managed Care – PPO | Attending: Nurse Practitioner | Admitting: Nurse Practitioner

## 2021-11-25 ENCOUNTER — Inpatient Hospital Stay: Payer: BC Managed Care – PPO

## 2022-03-12 ENCOUNTER — Encounter: Payer: Self-pay | Admitting: Oncology

## 2022-03-13 ENCOUNTER — Other Ambulatory Visit: Payer: Self-pay

## 2022-03-13 DIAGNOSIS — R102 Pelvic and perineal pain: Secondary | ICD-10-CM

## 2022-03-19 ENCOUNTER — Ambulatory Visit
Admission: RE | Admit: 2022-03-19 | Discharge: 2022-03-19 | Disposition: A | Discharge status: Home / Self Care | Payer: BC Managed Care – PPO | Source: Ambulatory Visit | Attending: Family Medicine | Admitting: Family Medicine

## 2022-03-19 DIAGNOSIS — R102 Pelvic and perineal pain: Secondary | ICD-10-CM | POA: Insufficient documentation

## 2022-03-20 ENCOUNTER — Other Ambulatory Visit: Payer: Self-pay

## 2022-03-20 DIAGNOSIS — O99013 Anemia complicating pregnancy, third trimester: Secondary | ICD-10-CM

## 2022-03-23 ENCOUNTER — Inpatient Hospital Stay: Payer: BC Managed Care – PPO

## 2022-03-23 ENCOUNTER — Encounter: Payer: Self-pay | Admitting: Oncology

## 2022-03-23 ENCOUNTER — Inpatient Hospital Stay: Payer: BC Managed Care – PPO | Attending: Oncology | Admitting: Oncology

## 2022-03-23 VITALS — BP 123/81 | HR 73 | Temp 97.7°F | Resp 18 | Wt 144.5 lb

## 2022-03-23 VITALS — BP 111/74 | HR 72 | Resp 18

## 2022-03-23 DIAGNOSIS — D509 Iron deficiency anemia, unspecified: Secondary | ICD-10-CM | POA: Diagnosis not present

## 2022-03-23 DIAGNOSIS — Z9884 Bariatric surgery status: Secondary | ICD-10-CM | POA: Diagnosis not present

## 2022-03-23 DIAGNOSIS — O99013 Anemia complicating pregnancy, third trimester: Secondary | ICD-10-CM

## 2022-03-23 LAB — CBC WITH DIFFERENTIAL/PLATELET
Abs Immature Granulocytes: 0.02 10*3/uL (ref 0.00–0.07)
Basophils Absolute: 0 10*3/uL (ref 0.0–0.1)
Basophils Relative: 1 %
Eosinophils Absolute: 0.1 10*3/uL (ref 0.0–0.5)
Eosinophils Relative: 1 %
HCT: 24.4 % — ABNORMAL LOW (ref 36.0–46.0)
Hemoglobin: 7.8 g/dL — ABNORMAL LOW (ref 12.0–15.0)
Immature Granulocytes: 0 %
Lymphocytes Relative: 24 %
Lymphs Abs: 1.4 10*3/uL (ref 0.7–4.0)
MCH: 24.5 pg — ABNORMAL LOW (ref 26.0–34.0)
MCHC: 32 g/dL (ref 30.0–36.0)
MCV: 76.5 fL — ABNORMAL LOW (ref 80.0–100.0)
Monocytes Absolute: 0.3 10*3/uL (ref 0.1–1.0)
Monocytes Relative: 5 %
Neutro Abs: 4.1 10*3/uL (ref 1.7–7.7)
Neutrophils Relative %: 69 %
Platelets: 276 10*3/uL (ref 150–400)
RBC: 3.19 MIL/uL — ABNORMAL LOW (ref 3.87–5.11)
RDW: 12.9 % (ref 11.5–15.5)
WBC: 5.9 10*3/uL (ref 4.0–10.5)
nRBC: 0 % (ref 0.0–0.2)

## 2022-03-23 LAB — IRON AND TIBC
Iron: 18 ug/dL — ABNORMAL LOW (ref 28–170)
Saturation Ratios: 3 % — ABNORMAL LOW (ref 10.4–31.8)
TIBC: 533 ug/dL — ABNORMAL HIGH (ref 250–450)
UIBC: 515 ug/dL

## 2022-03-23 LAB — FERRITIN: Ferritin: 3 ng/mL — ABNORMAL LOW (ref 11–307)

## 2022-03-23 MED ORDER — SODIUM CHLORIDE 0.9 % IV SOLN
510.0000 mg | Freq: Once | INTRAVENOUS | Status: AC
  Administered 2022-03-23: 510 mg via INTRAVENOUS
  Filled 2022-03-23: qty 17

## 2022-03-23 MED ORDER — SODIUM CHLORIDE 0.9 % IV SOLN
Freq: Once | INTRAVENOUS | Status: AC
  Filled 2022-03-23: qty 250

## 2022-03-23 NOTE — Progress Notes (Signed)
[facility]  [phone] [phone]  ID: Michele [name] OB: [birth year]  MR#: [id number]  AOZ#:[id number]  Patient Care Team: Center, [facility] as PCP - General (General Practice) Lloyd Huger, MD as Consulting Physician (Oncology)  CHIEF COMPLAINT: Iron deficiency anemia  INTERVAL HISTORY: Patient last seen in clinic in July 2021 just prior to her bariatric surgery.  She is referred back for persistently low hemoglobin and iron stores.  She has chronic weakness and fatigue, but otherwise feels well.  She has no neurologic complaints.  She denies any recent fevers or illnesses.  She has a good appetite and denies weight loss.  She has no chest pain, shortness of breath, cough, or hemoptysis.  She denies any nausea, vomiting, constipation, or diarrhea.  She has no melena or hematochezia.  She has no urinary complaints.  Patient offers no further specific complaints today.  REVIEW OF SYSTEMS:   Review of Systems  Constitutional:  Positive for malaise/fatigue. Negative for fever and weight loss.  Respiratory: Negative.  Negative for cough, hemoptysis and shortness of breath.   Cardiovascular: Negative.  Negative for chest pain and leg swelling.  Gastrointestinal: Negative.  Negative for abdominal pain, blood in stool and melena.  Genitourinary: Negative.  Negative for hematuria.  Musculoskeletal: Negative.  Negative for back pain.  Skin: Negative.  Negative for rash.  Neurological:  Positive for weakness. Negative for dizziness, focal weakness and headaches.  Psychiatric/Behavioral: Negative.  The patient is not nervous/anxious.     As per HPI. Otherwise, a complete review of systems is negative.  PAST MEDICAL HISTORY: Past Medical History:  Diagnosis Date   Anemia during pregnancy in second trimester 06/14/2017   Anxiety    Depression     PAST SURGICAL HISTORY: Past Surgical History:  Procedure Laterality Date    CESAREAN SECTION     times 4   CESAREAN SECTION WITH BILATERAL TUBAL LIGATION N/A 11/18/2017   Procedure: CESAREAN SECTION WITH BILATERAL TUBAL LIGATION;  Surgeon: Homero Fellers, MD;  Location: [facility] ORS;  Service: Obstetrics;  Laterality: N/A;   CHOLECYSTECTOMY     KNEE SURGERY     LAPAROSCOPIC ABDOMINAL EXPLORATION     37 years old   TONSILLECTOMY      FAMILY HISTORY: Family History  Problem Relation Age of Onset   Mental illness Father    Depression Father    Mental illness Sister    Schizophrenia Sister    Depression Mother    Cancer - Cervical Paternal Grandmother 39    ADVANCED DIRECTIVES (Y/N):  N  HEALTH MAINTENANCE: Social History   Tobacco Use   Smoking status: Never   Smokeless tobacco: Never  Vaping Use   Vaping Use: Never used  Substance Use Topics   Alcohol use: No    Comment: rare   Drug use: No     Colonoscopy:  PAP:  Bone density:  Lipid panel:  Allergies  Allergen Reactions   Penicillins Hives    Has patient had a PCN reaction causing immediate rash, facial/tongue/throat swelling, SOB or lightheadedness with hypotension:  no Has patient had a PCN reaction causing severe rash involving mucus membranes or skin necrosis: no Has patient had a PCN reaction that required hospitalization: no Has patient had a PCN reaction occurring within the last 10 years: no If all of the above answers are "NO", then may proceed with Cephalosporin use.    Sulfa Antibiotics Hives    Current Outpatient Medications  Medication Sig  Dispense Refill   ferrous sulfate 325 (65 FE) MG tablet Take 1 tablet (325 mg total) by mouth 2 (two) times daily with a meal. 60 tablet 0   Prenatal Vit-Fe Fumarate-FA (MULTIVITAMIN-PRENATAL) 27-0.8 MG TABS tablet Take 1 tablet by mouth daily at 12 noon.     sertraline (ZOLOFT) 25 MG tablet Take 25 mg by mouth daily.     No current facility-administered medications for this visit.    OBJECTIVE: Vitals:   03/23/22 1119  BP:  123/81  Pulse: 73  Resp: 18  Temp: 97.7 F (36.5 C)     Body mass index is 25.6 kg/m.    ECOG FS:0 - Asymptomatic  General: Well-developed, well-nourished, no acute distress. Eyes: Pink conjunctiva, anicteric sclera. HEENT: Normocephalic, moist mucous membranes. Lungs: No audible wheezing or coughing. Heart: Regular rate and rhythm. Abdomen: Soft, nontender, no obvious distention. Musculoskeletal: No edema, cyanosis, or clubbing. Neuro: Alert, answering all questions appropriately. Cranial nerves grossly intact. Skin: No rashes or petechiae noted. Psych: Normal affect. Lymphatics: No cervical, calvicular, axillary or inguinal LAD.   LAB RESULTS:  Lab Results  Component Value Date   NA 141 10/23/2021   K 4.0 10/23/2021   CL 108 10/23/2021   CO2 29 10/23/2021   GLUCOSE 101 (H) 10/23/2021   BUN 7 10/23/2021   CREATININE 0.69 10/23/2021   CALCIUM 8.9 10/23/2021   PROT 6.9 10/23/2021   ALBUMIN 4.1 10/23/2021   AST 26 10/23/2021   ALT 19 10/23/2021   ALKPHOS 97 10/23/2021   BILITOT 0.2 (L) 10/23/2021   GFRNONAA >60 10/23/2021   GFRAA >60 05/24/2018    Lab Results  Component Value Date   WBC 5.9 03/23/2022   NEUTROABS 4.1 03/23/2022   HGB 7.8 (L) 03/23/2022   HCT 24.4 (L) 03/23/2022   MCV 76.5 (L) 03/23/2022   PLT 276 03/23/2022   Lab Results  Component Value Date   IRON 18 (L) 03/23/2022   TIBC 533 (H) 03/23/2022   IRONPCTSAT 3 (L) 03/23/2022   Lab Results  Component Value Date   FERRITIN 3 (L) 03/23/2022     STUDIES: US PELVIC COMPLETE WITH TRANSVAGINAL  Result Date: 03/19/2022 CLINICAL DATA:  Pelvic pain in a female, chronic for 1 year, uncertain LMP EXAM: TRANSABDOMINAL AND TRANSVAGINAL ULTRASOUND OF PELVIS TECHNIQUE: Both transabdominal and transvaginal ultrasound examinations of the pelvis were performed. Transabdominal technique was performed for global imaging of the pelvis including uterus, ovaries, adnexal regions, and pelvic cul-de-sac. It was  necessary to proceed with endovaginal exam following the transabdominal exam to visualize the lower uterine segment/cervix and ovaries. COMPARISON:  12/31/2012 FINDINGS: Uterus Measurements: 6.6 x 5.4 x 5.2 cm = volume: 96 mL. Retroverted. Normal morphology without mass. Nabothian cyst at cervix. Endometrium Thickness: 14 mm.  No endometrial fluid or mass Right ovary Measurements: 3.6 x 2.2 x 3.0 cm = volume: 12.7 mL. Normal morphology without mass. Small resolving corpus luteum noted; no follow-up imaging recommended. Left ovary Measurements: 3.5 x 2.0 x 3.1 cm = volume: 11.4 mL. Normal morphology without mass Other findings No free pelvic fluid or adnexal masses. IMPRESSION: Normal exam. Electronically Signed   By: Ulyses Southward M.D.   On: 03/19/2022 14:41    ASSESSMENT: Iron deficiency anemia.  PLAN:    Iron deficiency anemia: Likely secondary to poor absorption from her recent bariatric surgery.  Patient's hemoglobin and iron stores remain decreased and she is mildly symptomatic.  Proceed with 510 mg IV Feraheme today.  Return to clinic in 1  week for second infusion.  Patient will then return to clinic in 3 months with repeat laboratory work, further evaluation, and consideration of additional IV iron if necessary.  I spent a total of 30 minutes reviewing chart data, face-to-face evaluation with the patient, counseling and coordination of care as detailed above.   Patient expressed understanding and was in agreement with this plan. She also understands that She can call clinic at any time with any questions, concerns, or complaints.    Lloyd Huger, MD   03/23/2022 1:33 PM

## 2022-03-23 NOTE — Progress Notes (Signed)
Patient here today for follow up regarding anemia, last seen 2021 and received IV iron. Patient reports fatigue, restless legs at night, headaches and easy bruising.

## 2022-03-30 ENCOUNTER — Inpatient Hospital Stay: Payer: BC Managed Care – PPO

## 2022-04-15 ENCOUNTER — Ambulatory Visit: Payer: BC Managed Care – PPO | Admitting: Urology

## 2022-05-17 ENCOUNTER — Encounter (HOSPITAL_COMMUNITY): Payer: Self-pay

## 2022-05-17 ENCOUNTER — Other Ambulatory Visit: Payer: Self-pay

## 2022-05-17 ENCOUNTER — Emergency Department (HOSPITAL_COMMUNITY)
Admission: EM | Admit: 2022-05-17 | Discharge: 2022-05-17 | Disposition: A | Discharge status: Home / Self Care | Payer: BC Managed Care – PPO | Attending: Emergency Medicine | Admitting: Emergency Medicine

## 2022-05-17 DIAGNOSIS — R21 Rash and other nonspecific skin eruption: Secondary | ICD-10-CM | POA: Insufficient documentation

## 2022-05-17 MED ORDER — DIPHENHYDRAMINE HCL 25 MG PO CAPS
25.0000 mg | ORAL_CAPSULE | Freq: Once | ORAL | Status: AC
  Administered 2022-05-17: 25 mg via ORAL
  Filled 2022-05-17: qty 1

## 2022-05-17 NOTE — ED Triage Notes (Signed)
Pt went to the tanning bed yesterday and now she has a rash on both legs. Feels like pins and needles all over them. Has not changed her lotion that she normally used.

## 2022-05-17 NOTE — ED Provider Notes (Signed)
[facility] Provider Note   CSN: [id number] Arrival date & time: 05/17/22  1431     History  Chief Complaint  Patient presents with   Rash   HPI Michele [name] is a 37 y.o. female presenting for rash.  Started yesterday after session at the tanning bed.  States she has been using a tanning bed for several years.  Never had this rash for.  The rash is located all over her lower legs and extends up into her groin but nowhere else.  States the rash is red and painful.  Also mention that she had some swelling in her calves extending into her upper legs but that that has improved since yesterday.  Denies oozing or bleeding from her legs.  Denies fever or chills.   Rash      Home Medications Prior to Admission medications   Medication Sig Start Date End Date Taking? Authorizing Provider  ferrous sulfate 325 (65 FE) MG tablet Take 1 tablet (325 mg total) by mouth 2 (two) times daily with a meal. 11/20/17   Gae Dry, MD  Prenatal Vit-Fe Fumarate-FA (MULTIVITAMIN-PRENATAL) 27-0.8 MG TABS tablet Take 1 tablet by mouth daily at 12 noon.    [provider]  sertraline (ZOLOFT) 25 MG tablet Take 25 mg by mouth daily.    [provider]      Allergies    Penicillins and Sulfa antibiotics    Review of Systems   Review of Systems  Skin:  Positive for rash.    Physical Exam Updated Vital Signs BP 127/74 (BP Location: Right Arm)   Pulse 84   Temp 98.5 F (36.9 C) (Oral)   Resp 18   Ht 5\' 3"  (1.6 m)   Wt 66.7 kg   LMP 04/28/2022   SpO2 100%   BMI 26.04 kg/m  Physical Exam Constitutional:      Appearance: Normal appearance.  HENT:     Head: Normocephalic.     Nose: Nose normal.  Eyes:     Conjunctiva/sclera: Conjunctivae normal.  Pulmonary:     Effort: Pulmonary effort is normal.  Skin:    Findings: Rash present. Rash is macular.     Comments: Diffuse papular rash located primarily around the  calves but extending up to the inguinal area bilaterally and down to the toes bilaterally.  Skin is not hot to touch.  Areas of rash are tender.  No oozing or bleeding.  Neurological:     Mental Status: She is alert.  Psychiatric:        Mood and Affect: Mood normal.     ED Results / Procedures / Treatments   Labs (all labs ordered are listed, but only abnormal results are displayed) Labs Reviewed - No data to display  EKG None  Radiology No results found.  Procedures Procedures    Medications Ordered in ED Medications  diphenhydrAMINE (BENADRYL) capsule 25 mg (has no administration in time range)    ED Course/ Medical Decision Making/ A&P                             Medical Decision Making  37 year old healthy female presenting for rash.  Exam notable for red macular diffuse rash in the lower extremities.  DDx includes miliaria, cellulitis, contact dermatitis, and DVT.  Symptoms consistent with miliaria rash given recent prolonged heat exposure.  Treated with Benadryl.  Advised to continue  taking Zyrtec or Benadryl at home.  Advised follow-up with her PCP in 2 to 3 days for reevaluation.  Symptoms are consistent with DVT.  Discussed return precautions.        Final Clinical Impression(s) / ED Diagnoses Final diagnoses:  Rash    Rx / DC Orders ED Discharge Orders     None         Harriet Pho, PA-C 05/17/22 1637    Noemi Chapel, MD 05/18/22 1239

## 2022-05-17 NOTE — ED Notes (Signed)
C/o non-blanching painful rash to BLE only. Fine maculopapular sandpaper like rash. Sparing upper body, trunk, BUE and face. Onset 2 d ago. Rash and pain only. Denies itching, fever, viral illness/sx, or other sx. No recent meds or antibiotics. H/o gastric bypass, not suppose to take NSAID, but took advil for pain. Endorses tanning bed use with usual lotion, same habit for years.

## 2022-05-17 NOTE — Discharge Instructions (Signed)
Evaluation revealed that you have what is likely a miliaria rash, also known as a "heat rash".  Treatment is antihistamines.  Recommend that you take Benadryl or Zyrtec at home daily for the next 4 days.  Also recommending follow-up with your PCP in the next 3 to 4 days for reevaluation of your rash.  If you have worsening swelling, tenderness in your calf, oozing or bleeding, new fever or chills or any other concerning symptom please return emergency department further evaluation.

## 2022-06-22 ENCOUNTER — Inpatient Hospital Stay: Payer: BC Managed Care – PPO | Attending: Oncology

## 2022-06-22 MED FILL — Ferumoxytol Inj 510 MG/17ML (30 MG/ML) (Elemental Fe): INTRAVENOUS | Qty: 17 | Status: AC

## 2022-06-23 ENCOUNTER — Encounter: Payer: Self-pay | Admitting: Oncology

## 2022-06-23 ENCOUNTER — Ambulatory Visit
Admission: RE | Admit: 2022-06-23 | Discharge: 2022-06-23 | Disposition: A | Discharge status: Home / Self Care | Payer: BC Managed Care – PPO | Attending: Family Medicine | Admitting: Family Medicine

## 2022-06-23 ENCOUNTER — Inpatient Hospital Stay: Payer: BC Managed Care – PPO

## 2022-06-23 ENCOUNTER — Inpatient Hospital Stay: Payer: BC Managed Care – PPO | Admitting: Oncology

## 2022-06-23 ENCOUNTER — Other Ambulatory Visit: Payer: Self-pay | Admitting: Family Medicine

## 2022-06-23 ENCOUNTER — Ambulatory Visit
Admission: RE | Admit: 2022-06-23 | Discharge: 2022-06-23 | Disposition: A | Discharge status: Home / Self Care | Payer: BC Managed Care – PPO | Source: Ambulatory Visit | Attending: Family Medicine | Admitting: Family Medicine

## 2022-06-23 DIAGNOSIS — M25552 Pain in left hip: Secondary | ICD-10-CM

## 2022-07-08 ENCOUNTER — Encounter: Payer: Self-pay | Admitting: Oncology

## 2022-07-12 ENCOUNTER — Emergency Department
Admission: EM | Admit: 2022-07-12 | Discharge: 2022-07-12 | Disposition: A | Discharge status: Home / Self Care | Payer: BC Managed Care – PPO | Source: Home / Self Care

## 2022-07-12 NOTE — ED Notes (Addendum)
After arriving to the ED, pt left and sts " I am not going to stay here with all these ppl, it going to be like six or seven hours." RN unable to go over risks and benefits with pt.

## 2022-08-01 DEATH — deceased

## 2023-03-01 ENCOUNTER — Encounter: Payer: Self-pay | Admitting: Oncology
